# Patient Record
Sex: Female | Born: 1955 | Race: Black or African American | Hispanic: No | Marital: Single | Smoking: Former smoker
Health system: Southern US, Community
[De-identification: ages and names within clinical notes are randomized; demographics above are authoritative.]

## PROBLEM LIST (undated history)

## (undated) DIAGNOSIS — F419 Anxiety disorder, unspecified: Secondary | ICD-10-CM

## (undated) DIAGNOSIS — I10 Essential (primary) hypertension: Secondary | ICD-10-CM

## (undated) DIAGNOSIS — J42 Unspecified chronic bronchitis: Secondary | ICD-10-CM

## (undated) DIAGNOSIS — G43909 Migraine, unspecified, not intractable, without status migrainosus: Secondary | ICD-10-CM

## (undated) DIAGNOSIS — M1990 Unspecified osteoarthritis, unspecified site: Secondary | ICD-10-CM

## (undated) DIAGNOSIS — J189 Pneumonia, unspecified organism: Secondary | ICD-10-CM

## (undated) DIAGNOSIS — F1410 Cocaine abuse, uncomplicated: Secondary | ICD-10-CM

## (undated) DIAGNOSIS — J45909 Unspecified asthma, uncomplicated: Secondary | ICD-10-CM

## (undated) DIAGNOSIS — J449 Chronic obstructive pulmonary disease, unspecified: Secondary | ICD-10-CM

## (undated) HISTORY — PX: TUBAL LIGATION: SHX77

## (undated) HISTORY — PX: BUNIONECTOMY: SHX129

## (undated) HISTORY — PX: ABDOMINAL HYSTERECTOMY: SHX81

---

## 1999-06-16 HISTORY — PX: LACERATION REPAIR: SHX5168

## 1999-06-25 ENCOUNTER — Emergency Department (HOSPITAL_COMMUNITY): Admission: EM | Admit: 1999-06-25 | Discharge: 1999-06-25 | Payer: Self-pay | Admitting: *Deleted

## 1999-06-29 ENCOUNTER — Ambulatory Visit (HOSPITAL_BASED_OUTPATIENT_CLINIC_OR_DEPARTMENT_OTHER): Admission: RE | Admit: 1999-06-29 | Discharge: 1999-06-29 | Payer: Self-pay | Admitting: Orthopedic Surgery

## 1999-07-06 ENCOUNTER — Encounter: Admission: RE | Admit: 1999-07-06 | Discharge: 1999-10-04 | Payer: Self-pay | Admitting: Orthopedic Surgery

## 1999-12-23 ENCOUNTER — Emergency Department (HOSPITAL_COMMUNITY): Admission: EM | Admit: 1999-12-23 | Discharge: 1999-12-23 | Payer: Self-pay | Admitting: Emergency Medicine

## 1999-12-23 ENCOUNTER — Encounter: Payer: Self-pay | Admitting: Emergency Medicine

## 2001-10-30 ENCOUNTER — Encounter: Payer: Self-pay | Admitting: Emergency Medicine

## 2001-10-30 ENCOUNTER — Emergency Department (HOSPITAL_COMMUNITY): Admission: EM | Admit: 2001-10-30 | Discharge: 2001-10-30 | Payer: Self-pay | Admitting: Emergency Medicine

## 2002-07-22 ENCOUNTER — Encounter: Payer: Self-pay | Admitting: Emergency Medicine

## 2002-07-23 ENCOUNTER — Encounter: Payer: Self-pay | Admitting: General Surgery

## 2002-07-23 ENCOUNTER — Inpatient Hospital Stay (HOSPITAL_COMMUNITY): Admission: EM | Admit: 2002-07-23 | Discharge: 2002-07-25 | Payer: Self-pay | Admitting: *Deleted

## 2006-03-17 ENCOUNTER — Emergency Department (HOSPITAL_COMMUNITY): Admission: EM | Admit: 2006-03-17 | Discharge: 2006-03-17 | Payer: Self-pay | Admitting: Emergency Medicine

## 2006-07-14 ENCOUNTER — Emergency Department (HOSPITAL_COMMUNITY): Admission: EM | Admit: 2006-07-14 | Discharge: 2006-07-14 | Payer: Self-pay | Admitting: Emergency Medicine

## 2009-02-08 ENCOUNTER — Emergency Department (HOSPITAL_COMMUNITY): Admission: EM | Admit: 2009-02-08 | Discharge: 2009-02-08 | Payer: Self-pay | Admitting: Family Medicine

## 2009-07-08 ENCOUNTER — Inpatient Hospital Stay (HOSPITAL_COMMUNITY): Admission: EM | Admit: 2009-07-08 | Discharge: 2009-07-10 | Payer: Self-pay | Admitting: Emergency Medicine

## 2009-07-08 ENCOUNTER — Ambulatory Visit: Payer: Self-pay | Admitting: Family Medicine

## 2010-10-16 LAB — BASIC METABOLIC PANEL
Calcium: 9.1 mg/dL (ref 8.4–10.5)
Calcium: 9.3 mg/dL (ref 8.4–10.5)
Chloride: 104 mEq/L (ref 96–112)
GFR calc Af Amer: >60 mL/min (ref >60)
GFR calc Af Amer: >60 mL/min (ref >60)
GFR calc non Af Amer: 50 mL/min — ABNORMAL LOW (ref >60)
Potassium: 4.4 mEq/L (ref 3.5–5.1)
Sodium: 140 mEq/L (ref 135–145)

## 2010-10-16 LAB — POCT I-STAT, CHEM 8
BUN: 15 mg/dL (ref 6–23)
Chloride: 110 mEq/L (ref 96–112)
Creatinine, Ser: 1.1 mg/dL (ref 0.4–1.2)
Potassium: 4 mEq/L (ref 3.5–5.1)
Sodium: 138 mEq/L (ref 135–145)

## 2010-10-16 LAB — CARDIAC PANEL(CRET KIN+CKTOT+MB+TROPI)
CK, MB: 1.1 ng/mL (ref 0.3–4.0)
Relative Index: INVALID (ref 0.0–2.5)
Total CK: 74 U/L (ref 7–177)

## 2010-10-16 LAB — DRUGS OF ABUSE SCREEN W/O ALC, ROUTINE URINE
Barbiturate Quant, Ur: NEGATIVE
Benzodiazepines.: NEGATIVE
Cocaine Metabolites: POSITIVE — AB
Creatinine,U: 242.2 mg/dL
Phencyclidine (PCP): NEGATIVE

## 2010-10-16 LAB — POCT CARDIAC MARKERS
CKMB, poc: 1.4 ng/mL (ref 1.0–8.0)
Myoglobin, poc: 57.9 ng/mL (ref 12–200)
Troponin i, poc: <0.05 ng/mL (ref 0.00–0.09)

## 2010-10-16 LAB — COCAINE, URINE, CONFIRMATION: Benzoylecgonine GC/MS Conf: 550 ng/mL

## 2010-10-16 LAB — CK TOTAL AND CKMB (NOT AT ARMC)
CK, MB: 1.7 ng/mL (ref 0.3–4.0)
Relative Index: 1.7 (ref 0.0–2.5)
Total CK: 101 U/L (ref 7–177)

## 2010-10-16 LAB — CBC: MCV: 89.2 fL (ref 78.0–100.0)

## 2010-10-16 LAB — TROPONIN I: Troponin I: 0.01 ng/mL (ref 0.00–0.06)

## 2010-12-01 NOTE — Op Note (Signed)
[place]  Patient:    Vanessa Wallace                       MRN: [id number] Proc. Date: 06/29/99 Adm. Date:  [id number] Attending:  Ronne Binning CC:         Nicki Reaper, M.D., 2 copies to office                           Operative Report  PREOPERATIVE DIAGNOSIS:  Laceration, left thenar eminence.  POSTOPERATIVE DIAGNOSIS:  Laceration, left thenar eminence.  OPERATION:  Repair FPL, radial digital nerve, intrinsics, left thumb.  SURGEON:  Nicki Reaper, M.D.  ASSISTANT:  Joaquin Courts, R.N.  ANESTHESIA:  General.  ANESTHESIOLOGIST:  Cliffton Asters. Ivin Booty, M.D.  HISTORY:  The patient is a 55 year old female with a history of a laceration to her thenar eminence, left hand.  She is complaining of numbness and tingling and the inability to flex the IP joint.  PROCEDURE:  The patient was brought to the operating room where a general endotracheal intubation and anesthesia were carried out without difficulty. She was prepped and draped using Betadine scrub and solution with the left arm free.  The limb was exsanguinated with an Esmarch bandage, tourniquet placed high on the arm, was inflated to 250 mmHg.  The wound was opened, extended distally.  The laceration was immediately apparent.  A hematoma was removed.  The dissection identified the FPL tendon, which was entirely lacerated.  A separate incision was made in the forearm and the tendon delivered distally.  The repair was then performed with a 3-0 Ti-Cron suture, with a 3-0 Ti-Cron in the epitenon.  The modified ______ was used for the initial repair.  The digital nerves were identified.  The ulnar digital nerve was intact.  The radial digital nerve was lacerated.  The operative microscope was brought into position.  The ends of the muscles of the intrinsics were closed with figure-of-eight 4-0 Vicryl sutures after copiously irrigating the wound.  The digital nerve was prepared.   Normal fascicles identified and a repair performed with interrupted 9-0 nylon sutures lining each fascicle.  The wound was again irrigated, skin closed with interrupted 5-0 nylon sutures.  Sterile compressive dressing and splint was applied.  The patient tolerated the procedure well and was taken to the recovery room for observation in satisfactory condition.  She is discharged to home to return to the [facility] in [place] in a week to 10 days on Vicodin and Keflex. DD:  06/29/99 TD:  06/29/99 Job: [id number]
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
## 2012-03-11 ENCOUNTER — Encounter (HOSPITAL_COMMUNITY): Payer: Self-pay | Admitting: Emergency Medicine

## 2012-03-11 ENCOUNTER — Emergency Department (HOSPITAL_COMMUNITY)
Admission: EM | Admit: 2012-03-11 | Discharge: 2012-03-11 | Disposition: A | Discharge status: Home / Self Care | Payer: Medicaid Other | Source: Home / Self Care | Attending: Emergency Medicine | Admitting: Emergency Medicine

## 2012-03-11 DIAGNOSIS — M5430 Sciatica, unspecified side: Secondary | ICD-10-CM

## 2012-03-11 DIAGNOSIS — L84 Corns and callosities: Secondary | ICD-10-CM

## 2012-03-11 DIAGNOSIS — I10 Essential (primary) hypertension: Secondary | ICD-10-CM

## 2012-03-11 LAB — POCT I-STAT, CHEM 8
Creatinine, Ser: 1.2 mg/dL — ABNORMAL HIGH (ref 0.50–1.10)
Sodium: 143 mEq/L (ref 135–145)

## 2012-03-11 MED ORDER — LISINOPRIL-HYDROCHLOROTHIAZIDE 20-25 MG PO TABS: 1.0000 | ORAL_TABLET | Freq: Every day | ORAL | Status: DC

## 2012-03-11 MED ORDER — PREDNISONE 5 MG PO KIT: 1.0000 | PACK | Freq: Every day | ORAL | Status: DC

## 2012-03-11 MED ORDER — CYCLOBENZAPRINE HCL 5 MG PO TABS: 5.0000 mg | ORAL_TABLET | Freq: Three times a day (TID) | ORAL | Status: AC | PRN

## 2012-03-11 NOTE — ED Notes (Signed)
States she fell and injured her low back a few days ago; also has are on bottom of foot that has been bothering her

## 2012-03-11 NOTE — ED Provider Notes (Signed)
Chief Complaint  Patient presents with  . Back Pain    History of Present Illness:   The patient is a 56 year old former cocaine user who presents today with a 2 month history of lower back pain. This began after a fall. She was seen at [facility] and an x-ray was done. She was prescribed hydrocodone. Her back is still hurting. She localizes the pain in the midline at the L5-S1 level with radiation down the entire right leg as far as the foot with some weakness in the leg. She denies any numbness or tingling, bladder or bowel complaints. She's had no abdominal pain, fever, chills, or unintentional weight loss. She also has a two-year history of foot pain involving mostly the right foot. She had surgery on this 2 years ago. She has a painful corn. It hurts her to walk. She also has had high blood pressure for about 2 years. She was on medication for this but ran out a month and half ago. Her blood pressure is elevated today and she had some headaches, generalized weakness, and sees some flashing lights. She has a history of bronchitis and uses an albuterol inhaler.  Review of Systems:  Other than noted above, the patient denies any of the following symptoms: Systemic:  No fever, chills, fatigue, or weight loss. GI:  No abdominal pain, nausea, vomiting, diarrhea, constipation or blood in stool. GU:  No dysuria, frequency, urgency, or hematuria. No incontinence or difficulty urinating.  M-S:  No neck pain, joint pain, arthritis, or myalgias. Neuro:  No parethesias or muscular weakness. Skin:  No rash or itching.   PMFSH:  Past medical history, family history, social history, meds, and allergies were reviewed.  Physical Exam:   Vital signs:  BP 179/115  Pulse 71  Temp 98.1 F (36.7 C) (Oral)  Resp 16  SpO2 100% General:  Alert, oriented, in no distress. Abdomen:  Soft, non-tender.  No organomegaly or mass.  No pulsatile midline abdominal mass or bruit. Back:  There is pain to  palpation lower back at the L5-S1 level. Her back has a limited range of motion with 30 of flexion, 10 of extension, 20 lateral bending, and 30 of rotation with pain. Straight leg raising is positive on the right with a positive Lasegue's sign and positive popliteal compression and negative on the left. Neuro:  Normal muscle strength, sensations and DTRs. Extremities: Pedal pulses were full, there was no edema. Her feet are filthy and caked with dry skin and dirt. She has a large corn on the plantar surface of the right foot near the metatarsal heads which is tender to touch. Skin:  Clear, warm and dry.  No rash.      Labs:   Results for orders placed during the hospital encounter of 03/11/12  POCT I-STAT, CHEM 8      Component Value Range   Sodium 143  135 - 145 mEq/L   Potassium 4.2  3.5 - 5.1 mEq/L   Chloride 106  96 - 112 mEq/L   BUN 17  6 - 23 mg/dL   Creatinine, Ser 4.09 (*) 0.50 - 1.10 mg/dL   Glucose, Bld 79  70 - 99 mg/dL   Calcium, Ion 8.11 (*) 1.12 - 1.23 mmol/L   TCO2 25  0 - 100 mmol/L   Hemoglobin 15.3 (*) 12.0 - 15.0 g/dL   HCT 91.4  78.2 - 95.6 %    Procedure Note:  Verbal informed consent was obtained from the patient.  Risks and benefits were outlined with the patient.  Patient understands and accepts these risks.  Identity of the patient was confirmed verbally and by armband.    Procedure was performed as followed:  The bottom of the foot was cleansed as best as possible with alcohol and the corn was pared off with a #15 scalpel blade.  Patient tolerated the procedure well without any immediate complications.  Assessment:  The primary encounter diagnosis was Corn. Diagnoses of Hypertension and Sciatica were also pertinent to this visit.  Plan:   1.  The following meds were prescribed:   New Prescriptions   CYCLOBENZAPRINE (FLEXERIL) 5 MG TABLET    Take 1 tablet (5 mg total) by mouth 3 (three) times daily as needed for muscle spasms.    LISINOPRIL-HYDROCHLOROTHIAZIDE (PRINZIDE,ZESTORETIC) 20-25 MG PER TABLET    Take 1 tablet by mouth daily.   PREDNISONE 5 MG KIT    Take 1 kit (5 mg total) by mouth daily after breakfast. Prednisone 5 mg 6 day dosepack.  Take as directed.   2.  The patient was instructed in symptomatic care and handouts were given. 3.  The patient was told to return if becoming worse in any way, if no better in 2 weeks, and given some red flag symptoms that would indicate earlier return. 4.  The patient was encouraged to try to be as active as possible and given some exercises to do followed by moist heat.    Reuben Likes, MD 03/11/12 2300

## 2013-05-28 ENCOUNTER — Emergency Department (HOSPITAL_COMMUNITY)
Admission: EM | Admit: 2013-05-28 | Discharge: 2013-05-28 | Disposition: A | Discharge status: Home / Self Care | Payer: Medicaid Other | Attending: Emergency Medicine | Admitting: Emergency Medicine

## 2013-05-28 ENCOUNTER — Emergency Department (HOSPITAL_COMMUNITY): Payer: Medicaid Other

## 2013-05-28 ENCOUNTER — Encounter (HOSPITAL_COMMUNITY): Payer: Self-pay | Admitting: Emergency Medicine

## 2013-05-28 DIAGNOSIS — I10 Essential (primary) hypertension: Secondary | ICD-10-CM | POA: Insufficient documentation

## 2013-05-28 DIAGNOSIS — IMO0002 Reserved for concepts with insufficient information to code with codable children: Secondary | ICD-10-CM | POA: Insufficient documentation

## 2013-05-28 DIAGNOSIS — J45901 Unspecified asthma with (acute) exacerbation: Secondary | ICD-10-CM | POA: Insufficient documentation

## 2013-05-28 DIAGNOSIS — Z79899 Other long term (current) drug therapy: Secondary | ICD-10-CM | POA: Insufficient documentation

## 2013-05-28 DIAGNOSIS — F17200 Nicotine dependence, unspecified, uncomplicated: Secondary | ICD-10-CM | POA: Insufficient documentation

## 2013-05-28 HISTORY — DX: Essential (primary) hypertension: I10

## 2013-05-28 HISTORY — DX: Unspecified asthma, uncomplicated: J45.909

## 2013-05-28 LAB — CBC WITH DIFFERENTIAL/PLATELET
Basophils Absolute: 0 10*3/uL (ref 0.0–0.1)
Basophils Relative: 0 % (ref 0–1)
Eosinophils Relative: 0 % (ref 0–5)
HCT: 38.7 % (ref 36.0–46.0)
Lymphocytes Relative: 8 % — ABNORMAL LOW (ref 12–46)
Lymphs Abs: 0.9 10*3/uL (ref 0.7–4.0)
MCH: 30.1 pg (ref 26.0–34.0)
MCV: 89.6 fL (ref 78.0–100.0)
Monocytes Absolute: 0.3 10*3/uL (ref 0.1–1.0)
RBC: 4.32 MIL/uL (ref 3.87–5.11)
RDW: 15.2 % (ref 11.5–15.5)
WBC: 11.4 10*3/uL — ABNORMAL HIGH (ref 4.0–10.5)

## 2013-05-28 LAB — BASIC METABOLIC PANEL
CO2: 25 mEq/L (ref 19–32)
Calcium: 9.2 mg/dL (ref 8.4–10.5)
Creatinine, Ser: 0.95 mg/dL (ref 0.50–1.10)
Glucose, Bld: 132 mg/dL — ABNORMAL HIGH (ref 70–99)

## 2013-05-28 LAB — PRO B NATRIURETIC PEPTIDE: Pro B Natriuretic peptide (BNP): 69.4 pg/mL (ref 0–125)

## 2013-05-28 MED ORDER — ALBUTEROL SULFATE (5 MG/ML) 0.5% IN NEBU
10.0000 mg | INHALATION_SOLUTION | Freq: Once | RESPIRATORY_TRACT | Status: AC
  Administered 2013-05-28: 10 mg via RESPIRATORY_TRACT
  Filled 2013-05-28: qty 2

## 2013-05-28 MED ORDER — ALBUTEROL SULFATE (5 MG/ML) 0.5% IN NEBU: 2.5000 mg | INHALATION_SOLUTION | RESPIRATORY_TRACT | Status: DC

## 2013-05-28 MED ORDER — MAGNESIUM SULFATE 40 MG/ML IJ SOLN
2.0000 g | Freq: Once | INTRAMUSCULAR | Status: AC
  Administered 2013-05-28: 2 g via INTRAVENOUS
  Filled 2013-05-28: qty 50

## 2013-05-28 MED ORDER — IPRATROPIUM BROMIDE 0.02 % IN SOLN
0.5000 mg | Freq: Once | RESPIRATORY_TRACT | Status: AC
  Administered 2013-05-28: 0.5 mg via RESPIRATORY_TRACT
  Filled 2013-05-28: qty 2.5

## 2013-05-28 MED ORDER — PREDNISONE 20 MG PO TABS: 40.0000 mg | ORAL_TABLET | Freq: Every day | ORAL | Status: DC

## 2013-05-28 MED ORDER — ALBUTEROL SULFATE HFA 108 (90 BASE) MCG/ACT IN AERS
2.0000 | INHALATION_SPRAY | Freq: Once | RESPIRATORY_TRACT | Status: AC
  Administered 2013-05-28: 2 via RESPIRATORY_TRACT
  Filled 2013-05-28: qty 6.7

## 2013-05-28 MED ORDER — IPRATROPIUM BROMIDE 0.02 % IN SOLN: 0.5000 mg | RESPIRATORY_TRACT | Status: DC

## 2013-05-28 NOTE — ED Notes (Signed)
Pt arrives via EMS from home with c/o SOB that began yesterday. Dry non productive cough. Pt tachypneic and wheezes throughout noted by EMs. Received 5mg  albuterol/ 0.5mg  atrovent, 125mg  solumedrol PTA. 20g IV placed L AC. CBG 101.

## 2013-05-28 NOTE — ED Notes (Signed)
MD at bedside. 

## 2013-05-28 NOTE — Progress Notes (Signed)
Pt refuses abg. RN notified

## 2013-05-28 NOTE — ED Provider Notes (Signed)
CSN: [id number]     Arrival date & time 05/28/13  4098 History   First MD Initiated Contact with Patient 05/28/13 0747     Chief Complaint  Patient presents with  . Shortness of Breath   (Consider location/radiation/quality/duration/timing/severity/associated sxs/prior Treatment) Patient is a 57 y.o. female presenting with shortness of breath. The history is provided by the patient. No language interpreter was used.  Shortness of Breath Severity:  Moderate Onset quality:  Gradual Duration:  8 hours Timing:  Constant Progression:  Worsening Chronicity:  Recurrent Relieved by: neb treatment. Worsened by:  Movement Ineffective treatments:  Inhaler Associated symptoms: cough, sputum production and wheezing   Associated symptoms: no abdominal pain, no chest pain, no claudication, no diaphoresis, no fever, no headaches, no neck pain, no sore throat and no vomiting   Cough:    Cough characteristics:  Productive   Sputum characteristics:  Clear   Severity:  Moderate   Onset quality:  Gradual   Duration:  8 hours   Timing:  Constant   Progression:  Worsening   Chronicity:  New Wheezing:    Severity:  Moderate   Onset quality:  Gradual   Duration:  8 hours   Timing:  Constant   Progression:  Unchanged   Chronicity:  New Risk factors: tobacco use   Risk factors: no hx of PE/DVT, no obesity, no oral contraceptive use, no prolonged immobilization and no recent surgery     Past Medical History  Diagnosis Date  . Asthma   . Hypertension    History reviewed. No pertinent past surgical history. History reviewed. No pertinent family history. History  Substance Use Topics  . Smoking status: Current Every Day Smoker  . Smokeless tobacco: Not on file  . Alcohol Use: Yes   OB History   Grav Para Term Preterm Abortions TAB SAB Ect Mult Living                 Review of Systems  Constitutional: Negative for fever, chills, diaphoresis, activity change, appetite change and fatigue.   HENT: Negative for congestion, facial swelling, rhinorrhea and sore throat.   Eyes: Negative for photophobia and discharge.  Respiratory: Positive for cough, sputum production, shortness of breath and wheezing. Negative for chest tightness.   Cardiovascular: Negative for chest pain, palpitations, claudication and leg swelling.  Gastrointestinal: Negative for nausea, vomiting, abdominal pain and diarrhea.  Endocrine: Negative for polydipsia and polyuria.  Genitourinary: Negative for dysuria, frequency, difficulty urinating and pelvic pain.  Musculoskeletal: Negative for arthralgias, back pain, neck pain and neck stiffness.  Skin: Negative for color change and wound.  Allergic/Immunologic: Negative for immunocompromised state.  Neurological: Negative for facial asymmetry, weakness, numbness and headaches.  Hematological: Does not bruise/bleed easily.  Psychiatric/Behavioral: Negative for confusion and agitation.    Allergies  Review of patient's allergies indicates no known allergies.  Home Medications   Current Outpatient Rx  Name  Route  Sig  Dispense  Refill  . albuterol (PROVENTIL HFA;VENTOLIN HFA) 108 (90 BASE) MCG/ACT inhaler   Inhalation   Inhale into the lungs every 6 (six) hours as needed for wheezing or shortness of breath (wheezing).         . budesonide-formoterol (SYMBICORT) 160-4.5 MCG/ACT inhaler   Inhalation   Inhale 2 puffs into the lungs 2 (two) times daily.         Marland Kitchen EXPIRED: lisinopril-hydrochlorothiazide (PRINZIDE,ZESTORETIC) 20-25 MG per tablet   Oral   Take 1 tablet by mouth daily.   30 tablet  0   . predniSONE (DELTASONE) 20 MG tablet   Oral   Take 2 tablets (40 mg total) by mouth daily.   25 tablet   0    BP 129/76  Pulse 100  Temp(Src) 99 F (37.2 C) (Oral)  Resp 29  SpO2 93% Physical Exam  Constitutional: She is oriented to person, place, and time. She appears well-developed and well-nourished. No distress.  HENT:  Head:  Normocephalic and atraumatic.  Mouth/Throat: No oropharyngeal exudate.  Eyes: Pupils are equal, round, and reactive to light.  Neck: Normal range of motion. Neck supple.  Cardiovascular: Normal rate, regular rhythm and normal heart sounds.  Exam reveals no gallop and no friction rub.   No murmur heard. Pulmonary/Chest: Tachypnea noted. No respiratory distress. She has wheezes in the right upper field, the right middle field, the left upper field and the left middle field. She has no rales.  Abdominal: Soft. Bowel sounds are normal. She exhibits no distension and no mass. There is no tenderness. There is no rebound and no guarding.  Musculoskeletal: Normal range of motion. She exhibits no edema and no tenderness.  Neurological: She is alert and oriented to person, place, and time.  Skin: Skin is warm and dry.  Psychiatric: She has a normal mood and affect.    ED Course  Procedures (including critical care time) Labs Review Labs Reviewed  CBC WITH DIFFERENTIAL - Abnormal; Notable for the following:    WBC 11.4 (*)    Neutrophils Relative % 90 (*)    Neutro Abs 10.2 (*)    Lymphocytes Relative 8 (*)    Monocytes Relative 2 (*)    All other components within normal limits  BASIC METABOLIC PANEL - Abnormal; Notable for the following:    Glucose, Bld 132 (*)    GFR calc non Af Amer 66 (*)    GFR calc Af Amer 77 (*)    All other components within normal limits  PRO B NATRIURETIC PEPTIDE   Imaging Review Dg Chest Portable 1 View  05/28/2013   CLINICAL DATA:  Shortness of breath, hypertension  EXAM: PORTABLE CHEST - 1 VIEW  COMPARISON:  07/08/2009  FINDINGS: Heart is upper limits normal in size. Lungs are clear. No effusions. No acute bony abnormality.  IMPRESSION: No active disease.   Electronically Signed   By: Charlett Nose M.D.   On: 05/28/2013 08:22    EKG Interpretation     Ventricular Rate:  87 PR Interval:  118 QRS Duration: 83 QT Interval:  392 QTC Calculation: 472 R  Axis:   74 Text Interpretation:  Sinus rhythm Borderline short PR interval LVH with secondary repolarization abnormality            MDM   1. Acute asthma exacerbation    Pt is a 57 y.o. female with Pmhx as above including asthma who presents with about 8 hrs of gradual onset productive cough, SOB/wheezing.  Pt states symptoms were not improved by inhaler at home, but did have some improvement w/ duoneb by EMS.  125 solumedrol also given. On PE, Pt tachypnea, but can speak in 1 full sentence, HR 87, 97% on RA.  Wheezing heard in upper lobes.  Given hx, symptoms were most concerning for acute asthma exacerbation. No risk factors for PE other than smoking. Continuous albuterol/ipratropium, but pt reported feelng worse, had continued tachypnea and unchanged lung exam.  2mg  IV magnesium given and pt felt much improved after this and full albuterol/piratropium treatment.  CXR unremarkable.  EKG as above.  I approached pt about admission for asthma exacerbation, but be refused admission.  She understood my concern that she could acutely decompensation at home w/o continued treatment in a monitored setting and would likely have to return to the ED for continued treatment.  I believe she has capacity to much such decision.  Prior to d/c, pt given albuterol inhaler w/ spacer, and rx for 5d prednisone burst.  (05/29/13 Of note, quantity of prednisone rx incorrect, was verbally changed by pharmacist at [facility] [phone] to 10 tabs.  Ms Hines had not yet picked up Rx)        Shanna Cisco, MD 05/29/13 [phone]

## 2013-09-17 ENCOUNTER — Inpatient Hospital Stay (HOSPITAL_COMMUNITY)
Admission: EM | Admit: 2013-09-17 | Discharge: 2013-09-19 | DRG: 195 | Disposition: A | Discharge status: Home / Self Care | Payer: Medicaid Other | Attending: Internal Medicine | Admitting: Internal Medicine

## 2013-09-17 ENCOUNTER — Emergency Department (HOSPITAL_COMMUNITY): Payer: Medicaid Other

## 2013-09-17 ENCOUNTER — Encounter (HOSPITAL_COMMUNITY): Payer: Self-pay | Admitting: Emergency Medicine

## 2013-09-17 DIAGNOSIS — F1410 Cocaine abuse, uncomplicated: Secondary | ICD-10-CM

## 2013-09-17 DIAGNOSIS — Z79899 Other long term (current) drug therapy: Secondary | ICD-10-CM

## 2013-09-17 DIAGNOSIS — F1490 Cocaine use, unspecified, uncomplicated: Secondary | ICD-10-CM | POA: Diagnosis present

## 2013-09-17 DIAGNOSIS — J45909 Unspecified asthma, uncomplicated: Secondary | ICD-10-CM

## 2013-09-17 DIAGNOSIS — F17200 Nicotine dependence, unspecified, uncomplicated: Secondary | ICD-10-CM | POA: Diagnosis present

## 2013-09-17 DIAGNOSIS — D72829 Elevated white blood cell count, unspecified: Secondary | ICD-10-CM | POA: Diagnosis present

## 2013-09-17 DIAGNOSIS — I10 Essential (primary) hypertension: Secondary | ICD-10-CM | POA: Diagnosis present

## 2013-09-17 DIAGNOSIS — J189 Pneumonia, unspecified organism: Principal | ICD-10-CM

## 2013-09-17 DIAGNOSIS — R079 Chest pain, unspecified: Secondary | ICD-10-CM

## 2013-09-17 DIAGNOSIS — E876 Hypokalemia: Secondary | ICD-10-CM | POA: Diagnosis present

## 2013-09-17 HISTORY — DX: Migraine, unspecified, not intractable, without status migrainosus: G43.909

## 2013-09-17 HISTORY — DX: Unspecified chronic bronchitis: J42

## 2013-09-17 HISTORY — DX: Pneumonia, unspecified organism: J18.9

## 2013-09-17 HISTORY — DX: Unspecified osteoarthritis, unspecified site: M19.90

## 2013-09-17 LAB — CBC WITH DIFFERENTIAL/PLATELET
Basophils Absolute: 0 10*3/uL (ref 0.0–0.1)
Basophils Relative: 0 % (ref 0–1)
Eosinophils Absolute: 0 10*3/uL (ref 0.0–0.7)
Eosinophils Relative: 0 % (ref 0–5)
HCT: 35.8 % — ABNORMAL LOW (ref 36.0–46.0)
HEMOGLOBIN: 12.1 g/dL (ref 12.0–15.0)
LYMPHS ABS: 1 10*3/uL (ref 0.7–4.0)
LYMPHS PCT: 6 % — AB (ref 12–46)
MCH: 29.1 pg (ref 26.0–34.0)
MCHC: 33.8 g/dL (ref 30.0–36.0)
MCV: 86.1 fL (ref 78.0–100.0)
Monocytes Absolute: 1.1 10*3/uL — ABNORMAL HIGH (ref 0.1–1.0)
Monocytes Relative: 7 % (ref 3–12)
NEUTROS PCT: 87 % — AB (ref 43–77)
Neutro Abs: 13.6 10*3/uL — ABNORMAL HIGH (ref 1.7–7.7)
Platelets: 269 10*3/uL (ref 150–400)
RBC: 4.16 MIL/uL (ref 3.87–5.11)
RDW: 14.4 % (ref 11.5–15.5)
WBC: 15.7 10*3/uL — AB (ref 4.0–10.5)

## 2013-09-17 LAB — BASIC METABOLIC PANEL
BUN: 14 mg/dL (ref 6–23)
CO2: 24 mEq/L (ref 19–32)
Calcium: 9.1 mg/dL (ref 8.4–10.5)
Chloride: 98 mEq/L (ref 96–112)
Creatinine, Ser: 0.73 mg/dL (ref 0.50–1.10)
GFR calc Af Amer: >90 mL/min (ref >90)
GLUCOSE: 123 mg/dL — AB (ref 70–99)
POTASSIUM: 3.4 meq/L — AB (ref 3.7–5.3)
Sodium: 136 mEq/L — ABNORMAL LOW (ref 137–147)

## 2013-09-17 LAB — RAPID URINE DRUG SCREEN, HOSP PERFORMED
AMPHETAMINES: NOT DETECTED
Barbiturates: NOT DETECTED
Benzodiazepines: NOT DETECTED
Cocaine: POSITIVE — AB
OPIATES: NOT DETECTED
Tetrahydrocannabinol: NOT DETECTED

## 2013-09-17 LAB — EXPECTORATED SPUTUM ASSESSMENT W REFEX TO RESP CULTURE

## 2013-09-17 LAB — STREP PNEUMONIAE URINARY ANTIGEN: Strep Pneumo Urinary Antigen: NEGATIVE

## 2013-09-17 LAB — INFLUENZA PANEL BY PCR (TYPE A & B)
H1N1 flu by pcr: NOT DETECTED
INFLAPCR: NEGATIVE
Influenza B By PCR: NEGATIVE

## 2013-09-17 LAB — HIV ANTIBODY (ROUTINE TESTING W REFLEX): HIV: NONREACTIVE

## 2013-09-17 LAB — TROPONIN I: Troponin I: <0.3 ng/mL (ref <0.30)

## 2013-09-17 LAB — EXPECTORATED SPUTUM ASSESSMENT W GRAM STAIN, RFLX TO RESP C

## 2013-09-17 MED ORDER — HYDROCHLOROTHIAZIDE 25 MG PO TABS
25.0000 mg | ORAL_TABLET | Freq: Every day | ORAL | Status: DC
  Administered 2013-09-17 – 2013-09-19 (×3): 25 mg via ORAL
  Filled 2013-09-17 (×3): qty 1

## 2013-09-17 MED ORDER — SODIUM CHLORIDE 0.9 % IJ SOLN
3.0000 mL | Freq: Two times a day (BID) | INTRAMUSCULAR | Status: DC
  Administered 2013-09-17 – 2013-09-19 (×3): 3 mL via INTRAVENOUS

## 2013-09-17 MED ORDER — IPRATROPIUM BROMIDE 0.02 % IN SOLN: 0.5000 mg | Freq: Four times a day (QID) | RESPIRATORY_TRACT | Status: DC

## 2013-09-17 MED ORDER — ACETAMINOPHEN 325 MG PO TABS
650.0000 mg | ORAL_TABLET | Freq: Once | ORAL | Status: AC
  Administered 2013-09-17: 650 mg via ORAL
  Filled 2013-09-17: qty 2

## 2013-09-17 MED ORDER — LISINOPRIL 20 MG PO TABS
20.0000 mg | ORAL_TABLET | Freq: Every day | ORAL | Status: DC
  Administered 2013-09-17 – 2013-09-19 (×3): 20 mg via ORAL
  Filled 2013-09-17 (×3): qty 1

## 2013-09-17 MED ORDER — ALBUTEROL (5 MG/ML) CONTINUOUS INHALATION SOLN
15.0000 mg/h | INHALATION_SOLUTION | Freq: Once | RESPIRATORY_TRACT | Status: AC
  Administered 2013-09-17: 15 mg/h via RESPIRATORY_TRACT
  Filled 2013-09-17: qty 20

## 2013-09-17 MED ORDER — HEPARIN SODIUM (PORCINE) 5000 UNIT/ML IJ SOLN
5000.0000 [IU] | Freq: Three times a day (TID) | INTRAMUSCULAR | Status: DC
  Administered 2013-09-17 – 2013-09-19 (×6): 5000 [IU] via SUBCUTANEOUS
  Filled 2013-09-17 (×9): qty 1

## 2013-09-17 MED ORDER — SODIUM CHLORIDE 0.9 % IV BOLUS (SEPSIS)
1000.0000 mL | Freq: Once | INTRAVENOUS | Status: AC
  Administered 2013-09-17: 1000 mL via INTRAVENOUS

## 2013-09-17 MED ORDER — LISINOPRIL-HYDROCHLOROTHIAZIDE 20-25 MG PO TABS: 1.0000 | ORAL_TABLET | Freq: Every day | ORAL | Status: DC

## 2013-09-17 MED ORDER — LEVOFLOXACIN 500 MG PO TABS
500.0000 mg | ORAL_TABLET | Freq: Once | ORAL | Status: AC
  Administered 2013-09-17: 500 mg via ORAL
  Filled 2013-09-17: qty 1

## 2013-09-17 MED ORDER — AZITHROMYCIN 500 MG PO TABS
500.0000 mg | ORAL_TABLET | ORAL | Status: DC
  Administered 2013-09-17 – 2013-09-18 (×2): 500 mg via ORAL
  Filled 2013-09-17: qty 2
  Filled 2013-09-17 (×2): qty 1

## 2013-09-17 MED ORDER — BENZONATATE 100 MG PO CAPS
200.0000 mg | ORAL_CAPSULE | Freq: Once | ORAL | Status: AC
  Administered 2013-09-17: 200 mg via ORAL
  Filled 2013-09-17: qty 2

## 2013-09-17 MED ORDER — SODIUM CHLORIDE 0.9 % IJ SOLN: 3.0000 mL | Freq: Two times a day (BID) | INTRAMUSCULAR | Status: DC

## 2013-09-17 MED ORDER — PREDNISONE 20 MG PO TABS
60.0000 mg | ORAL_TABLET | Freq: Once | ORAL | Status: AC
  Administered 2013-09-17: 60 mg via ORAL
  Filled 2013-09-17: qty 3

## 2013-09-17 MED ORDER — IPRATROPIUM BROMIDE 0.02 % IN SOLN
0.5000 mg | Freq: Once | RESPIRATORY_TRACT | Status: AC
  Administered 2013-09-17: 0.5 mg via RESPIRATORY_TRACT
  Filled 2013-09-17: qty 2.5

## 2013-09-17 MED ORDER — LORAZEPAM 2 MG/ML IJ SOLN
1.0000 mg | Freq: Once | INTRAMUSCULAR | Status: AC
  Administered 2013-09-17: 1 mg via INTRAVENOUS
  Filled 2013-09-17: qty 1

## 2013-09-17 MED ORDER — GUAIFENESIN ER 600 MG PO TB12
600.0000 mg | ORAL_TABLET | Freq: Two times a day (BID) | ORAL | Status: DC
  Administered 2013-09-17 – 2013-09-19 (×3): 600 mg via ORAL
  Filled 2013-09-17 (×6): qty 1

## 2013-09-17 MED ORDER — CEFTRIAXONE SODIUM 1 G IJ SOLR
1.0000 g | INTRAMUSCULAR | Status: DC
  Administered 2013-09-17 – 2013-09-18 (×2): 1 g via INTRAVENOUS
  Filled 2013-09-17 (×3): qty 10

## 2013-09-17 MED ORDER — SODIUM CHLORIDE 0.9 % IV SOLN: 250.0000 mL | INTRAVENOUS | Status: DC | PRN

## 2013-09-17 MED ORDER — SODIUM CHLORIDE 0.9 % IJ SOLN: 3.0000 mL | INTRAMUSCULAR | Status: DC | PRN

## 2013-09-17 MED ORDER — ALBUTEROL SULFATE (2.5 MG/3ML) 0.083% IN NEBU: 2.5000 mg | INHALATION_SOLUTION | Freq: Four times a day (QID) | RESPIRATORY_TRACT | Status: DC

## 2013-09-17 MED ORDER — BUDESONIDE-FORMOTEROL FUMARATE 160-4.5 MCG/ACT IN AERO
2.0000 | INHALATION_SPRAY | Freq: Two times a day (BID) | RESPIRATORY_TRACT | Status: DC
  Administered 2013-09-18 – 2013-09-19 (×3): 2 via RESPIRATORY_TRACT
  Filled 2013-09-17: qty 6

## 2013-09-17 NOTE — ED Provider Notes (Signed)
CSN: [id number]     Arrival date & time 09/17/13  0917 History   First MD Initiated Contact with Patient 09/17/13 [phone]     Chief Complaint  Patient presents with  . Respiratory Distress     (Consider location/radiation/quality/duration/timing/severity/associated sxs/prior Treatment) HPI  This a 58 year old female with history of asthma and hypertension who presents with shortness of breath and cough. Patient reports several days of cough. Report of the last 24 hours she has had production of white phlegm. At 5 AM this morning she noted blood streaking in the phlegm. It occurs only occasionally and is described as scant. Patient denies any history COPD but is a current smoker. She does have a history of asthma. Patient reports that she's run out of her albuterol. She denies any fevers. She denies any chills.  Patient does report left rib pain which she describes as sharp. It is worse with breathing. Currently she is pain-free.  Past Medical History  Diagnosis Date  . Asthma   . Hypertension    History reviewed. No pertinent past surgical history. History reviewed. No pertinent family history. History  Substance Use Topics  . Smoking status: Current Every Day Smoker -- 0.30 packs/day    Types: Cigarettes  . Smokeless tobacco: Not on file  . Alcohol Use: Yes     Comment: Last drink 2 days ago   OB History   Grav Para Term Preterm Abortions TAB SAB Ect Mult Living                 Review of Systems  Constitutional: Negative for fever.  Respiratory: Positive for cough, shortness of breath and wheezing. Negative for chest tightness.   Cardiovascular: Negative for chest pain and leg swelling.  Gastrointestinal: Negative for nausea, vomiting, abdominal pain and blood in stool.  Genitourinary: Negative for dysuria.  Musculoskeletal: Negative for back pain.  Skin: Negative for wound.  Neurological: Negative for headaches.  Psychiatric/Behavioral: Negative for confusion.  All other  systems reviewed and are negative.      Allergies  Review of patient's allergies indicates no known allergies.  Home Medications   Current Outpatient Rx  Name  Route  Sig  Dispense  Refill  . albuterol (PROVENTIL HFA;VENTOLIN HFA) 108 (90 BASE) MCG/ACT inhaler   Inhalation   Inhale into the lungs every 6 (six) hours as needed for wheezing or shortness of breath (wheezing).         . budesonide-formoterol (SYMBICORT) 160-4.5 MCG/ACT inhaler   Inhalation   Inhale 2 puffs into the lungs 2 (two) times daily.         Marland Kitchen. lisinopril-hydrochlorothiazide (PRINZIDE,ZESTORETIC) 20-25 MG per tablet   Oral   Take 1 tablet by mouth daily.         . Pseudoeph-Doxylamine-DM-APAP (DAYQUIL/NYQUIL COLD/FLU RELIEF PO)   Oral   Take 1 capsule by mouth 2 (two) times daily as needed (for cold symptoms).          BP 151/81  Pulse 102  Temp(Src) 101.2 F (38.4 C) (Oral)  Resp 20  Wt 156 lb (70.761 kg)  SpO2 97% Physical Exam  Nursing note and vitals reviewed. Constitutional: She is oriented to person, place, and time. No distress.  Ill-appearing but nontoxic  HENT:  Head: Normocephalic and atraumatic.  Mouth/Throat: Oropharynx is clear and moist.  Eyes: Pupils are equal, round, and reactive to light.  Neck: Neck supple.  Cardiovascular: Regular rhythm and normal heart sounds.   Tachycardia  Pulmonary/Chest: Effort normal. No respiratory  distress. She has wheezes. She has no rales.  Decreased air movement, mild tachypnea, wheezes noted most prominently over the left lung.  Abdominal: Soft. Bowel sounds are normal. There is no tenderness.  Musculoskeletal: She exhibits no edema.  Neurological: She is alert and oriented to person, place, and time.  Skin: Skin is warm and dry.  Psychiatric: She has a normal mood and affect.    ED Course  Procedures (including critical care time) Labs Review Labs Reviewed  CBC WITH DIFFERENTIAL - Abnormal; Notable for the following:    WBC 15.7  (*)    HCT 35.8 (*)    Neutrophils Relative % 87 (*)    Neutro Abs 13.6 (*)    Lymphocytes Relative 6 (*)    Monocytes Absolute 1.1 (*)    All other components within normal limits  BASIC METABOLIC PANEL - Abnormal; Notable for the following:    Sodium 136 (*)    Potassium 3.4 (*)    Glucose, Bld 123 (*)    All other components within normal limits  CULTURE, BLOOD (ROUTINE X 2)  CULTURE, BLOOD (ROUTINE X 2)  CULTURE, EXPECTORATED SPUTUM-ASSESSMENT  GRAM STAIN  TROPONIN I  INFLUENZA PANEL BY PCR (TYPE A & B, H1N1)  URINE RAPID DRUG SCREEN (HOSP PERFORMED)  HIV ANTIBODY (ROUTINE TESTING)  LEGIONELLA ANTIGEN, URINE  STREP PNEUMONIAE URINARY ANTIGEN   Imaging Review Dg Chest Port 1 View  09/17/2013   CLINICAL DATA:  Respiratory distress  EXAM: PORTABLE CHEST - 1 VIEW  COMPARISON:  May 28, 2013  FINDINGS: There is left lower lobe consolidation. Lungs elsewhere clear. Heart is upper normal in size with normal pulmonary vascularity. No adenopathy. No bone lesions.  IMPRESSION: Left lower lobe consolidation.   Electronically Signed   By: Bretta Bang M.D.   On: 09/17/2013 10:20     EKG Interpretation None      MDM   Final diagnoses:  Community acquired pneumonia  Asthma   Patient presents with cough and shortness of breath. Wheezing on exam. Noted to have a temperature of 101.2. Basic labwork was obtained and notable for leukocytosis. Chest x-ray shows pneumonia. No risk factors for hospital-acquired pneumonia. Patient was given a duo neb and steroids given her asthma history.  Patient was also given Levaquin. On repeat examination, patient continues to be tachypneic with minimal wheezing. Given history of asthma and clinical picture, the patient would benefit from observation and neb treatments overnight in the hospital.    Shon Baton, MD 09/17/13 [phone]

## 2013-09-17 NOTE — Progress Notes (Signed)
Received pt assignment at 1507. Called ED Verlon Au(Leslie) for report at 1516. Was tols Verlon AuLeslie is in a code, and needs to call back. Awaiting report.

## 2013-09-17 NOTE — Progress Notes (Signed)
Dayshift RN Verdon CumminsJesse spoke with MD due to pt being agitated. MD ordered Ativan 1mg  IVx1. Will continue to monitor pt. Nelda MarseilleJenny Thacker, RN

## 2013-09-17 NOTE — ED Notes (Signed)
Arrived to ed with breathing treatment going from ems

## 2013-09-17 NOTE — ED Notes (Signed)
Sputum cup labelled/placed in room and explained to pt. has productive cough.

## 2013-09-17 NOTE — H&P (Signed)
Date: 09/17/2013               Patient Name:  Vanessa Wallace MRN: [id number]  DOB: 1956/03/08 Age / Sex: 58 y.o., female   PCP: Provider Not In System              Medical Service: Internal Medicine Teaching Service              Attending Physician: Dr. Jonah BlueAlejandro Paya, DO    First Contact: Willaim RayasEmery Nova Evett, MS 4 Pager: [phone]  Second Contact: Dr. Leonia ReevesSoli Kennerly Pager: [phone]            After Hours (After 5p/  Dr. Andrey CampanileWilson Pager: [phone]  weekends / holidays): Dr. Virgina OrganQureshi Pager: [phone]   Chief Complaint: Cough  History of Present Illness: Vanessa Wallace is a 58 year old female with a PMH of hypertension, asthma, and cocaine abuse [facility] with a 2 week cough that became acutely worse today.  She reports that she started coughing about two weeks ago and she felt that this was due to her asthma.  She tried to make an appointment to see her new PCP, but her appointments were cancelled due to inclement weather and she has been unable to see him yet.  This morning, she started feeling much worse and her chest started to hurt while coughing.  She also began coughing up a bit of blood in her sputum.  She denied taking her temperature at home.  She denies any sick contacts.  She typically uses both Albuterol and Symbicort for her asthma.  She denies any chest pain except her left rib pain while coughing.  She denies any shortness of breath.  All other reviews of symptoms were negative.  In the ED, her CXR showed a left lower lobe consolidation.  Blood cultures were drawn.   Levaquin 500mg  oral was given, as well as Prednisone 60mg .  Meds: No current facility-administered medications for this encounter.   Current Outpatient Prescriptions  Medication Sig Dispense Refill  . albuterol (PROVENTIL HFA;VENTOLIN HFA) 108 (90 BASE) MCG/ACT inhaler Inhale into the lungs every 6 (six) hours as needed for wheezing or shortness of breath (wheezing).      . budesonide-formoterol (SYMBICORT)  160-4.5 MCG/ACT inhaler Inhale 2 puffs into the lungs 2 (two) times daily.      Marland Kitchen. lisinopril-hydrochlorothiazide (PRINZIDE,ZESTORETIC) 20-25 MG per tablet Take 1 tablet by mouth daily.      . Pseudoeph-Doxylamine-DM-APAP (DAYQUIL/NYQUIL COLD/FLU RELIEF PO) Take 1 capsule by mouth 2 (two) times daily as needed (for cold symptoms).        Allergies: Allergies as of 09/17/2013  . (No Known Allergies)   Past Medical History  Diagnosis Date  . Asthma   . Hypertension    History reviewed. No pertinent past surgical history. History reviewed. No pertinent family history. History   Social History  . Marital Status: Single    Spouse Name: N/A    Number of Children: N/A  . Years of Education: N/A   Occupational History  . Not on file.   Social History Main Topics  . Smoking status: Current Every Day Smoker -- 0.30 packs/day    Types: Cigarettes  . Smokeless tobacco: Not on file  . Alcohol Use: Yes     Comment: Last drink 2 days ago  . Drug Use: No     Comment: Previous history of cocaine abuse  . Sexual Activity: Not on file   Other Topics Concern  .  Not on file   Social History Narrative  . No narrative on file    Review of Systems: A comprehensive review of systems was negative except for: Constitutional: positive for chills and fevers Eyes: positive for eye drainage Respiratory: positive for asthma, cough and pleurisy/chest pain Cardiovascular: positive for chest pain  Physical Exam: Blood pressure 151/81, pulse 102, temperature 101.2 F (38.4 C), temperature source Oral, resp. rate 20, weight 70.761 kg (156 lb), SpO2 97.00%. BP 151/81  Pulse 102  Temp(Src) 101.2 F (38.4 C) (Oral)  Resp 20  Wt 70.761 kg (156 lb)  SpO2 97% General appearance: alert and orieted, however tremulous onexam, very sleepy, and somewhat agitated Eyes: Pupils equal bilaterally, increased tearing Throat: normal findings: oropharynx pink & moist without lesions or evidence of  thrush Lungs: Rhigh lobe clear to auscultation, crackles in left lower lobe, wheezing at both apices Heart: regular rate and rhythm, S1, S2 normal, no murmur, click, rub or gallop Abdomen: soft, non-tender; bowel sounds normal; no masses,  no organomegaly and tender to palpation along rib cage in left upper quadrant  Lab results: Results for orders placed during the hospital encounter of 09/17/13 (from the past 24 hour(s))  CBC WITH DIFFERENTIAL     Status: Abnormal   Collection Time    09/17/13 10:04 AM      Result Value Ref Range   WBC 15.7 (*) 4.0 - 10.5 K/uL   RBC 4.16  3.87 - 5.11 MIL/uL   Hemoglobin 12.1  12.0 - 15.0 g/dL   HCT 16.1 (*) 09.6 - 04.5 %   MCV 86.1  78.0 - 100.0 fL   MCH 29.1  26.0 - 34.0 pg   MCHC 33.8  30.0 - 36.0 g/dL   RDW 40.9  81.1 - 91.4 %   Platelets 269  150 - 400 K/uL   Neutrophils Relative % 87 (*) 43 - 77 %   Neutro Abs 13.6 (*) 1.7 - 7.7 K/uL   Lymphocytes Relative 6 (*) 12 - 46 %   Lymphs Abs 1.0  0.7 - 4.0 K/uL   Monocytes Relative 7  3 - 12 %   Monocytes Absolute 1.1 (*) 0.1 - 1.0 K/uL   Eosinophils Relative 0  0 - 5 %   Eosinophils Absolute 0.0  0.0 - 0.7 K/uL   Basophils Relative 0  0 - 1 %   Basophils Absolute 0.0  0.0 - 0.1 K/uL  BASIC METABOLIC PANEL     Status: Abnormal   Collection Time    09/17/13 10:04 AM      Result Value Ref Range   Sodium 136 (*) 137 - 147 mEq/L   Potassium 3.4 (*) 3.7 - 5.3 mEq/L   Chloride 98  96 - 112 mEq/L   CO2 24  19 - 32 mEq/L   Glucose, Bld 123 (*) 70 - 99 mg/dL   BUN 14  6 - 23 mg/dL   Creatinine, Ser 7.82  0.50 - 1.10 mg/dL   Calcium 9.1  8.4 - 95.6 mg/dL   GFR calc non Af Amer >90  >90 mL/min   GFR calc Af Amer >90  >90 mL/min  TROPONIN I     Status: None   Collection Time    09/17/13 10:04 AM      Result Value Ref Range   Troponin I <0.30  <0.30 ng/mL  INFLUENZA PANEL BY PCR (TYPE A & B, H1N1)     Status: None   Collection Time    09/17/13  12:08 PM      Result Value Ref Range   Influenza A  By PCR NEGATIVE  NEGATIVE   Influenza B By PCR NEGATIVE  NEGATIVE   H1N1 flu by pcr NOT DETECTED  NOT DETECTED     Imaging results:  Dg Chest Port 1 View  09/17/2013   CLINICAL DATA:  Respiratory distress  EXAM: PORTABLE CHEST - 1 VIEW  COMPARISON:  May 28, 2013  FINDINGS: There is left lower lobe consolidation. Lungs elsewhere clear. Heart is upper normal in size with normal pulmonary vascularity. No adenopathy. No bone lesions.  IMPRESSION: Left lower lobe consolidation.   Electronically Signed   By: Bretta Bang M.D.   On: 09/17/2013 10:20    Other results: EKG: normal EKG, normal sinus rhythm, unchanged from previous tracings.  Assessment & Plan by Problem: Active Problems:   Pneumonia  Vanessa Wallace is a 58 year old woman with asthma and hypertension who presents to the ED with a cough, likely due to community acquired pneumonia.  #CAP: Cough for two weeks, getting acutely worse today.  CXR showed left lower lobe consolidation.  WBC elevated, febrile.  CURB65 score of 0, PORT score 48, but will still receive inpatient treatment given her mental status and ill appearance, as well as asthma history with wheezing on exam. Flu swab negative. - Change antibiotics to Ceftriaxone 1g IV and Azithromycin 500mg  oral - Blood cultures pending, sputum cultures, gram stain pending - Strep pneumonia, Legionella urine antigen pending - Tylenol and Mucinex as needed - Inhalers as below  #Asthma: History of asthma, with home meds of Albuterol and Symbicort.  Increased coughing over the past two weeks. - Continue home Albuterol and Symbicort inhalers  #Hypertension:  Taking Lisinopril-HCTZ 20-25 at home.  BP on exam 151/81.  - Continue home Lisinopril-HCTZ  #History of cocaine abuse: Patient has a history of cocaine abuse, with last UDS in 2010.  She denied any drug use, however she was very shaky on exam, somnolent, with increased tearing. - Obtain UDS - HIV screening  #Prophylaxis:  Heparin SQ  #Diet: Regular  This is a Psychologist, occupational Note.  The care of the patient was discussed with Dr. Leonia Reeves and the assessment and plan was formulated with their assistance.  Please see their note for official documentation of the patient encounter.   Signed: Roderic Palau, Med Student 09/17/2013, 2:42 PM

## 2013-09-17 NOTE — Progress Notes (Signed)
Pt admitted to the unit at 1637. Pt mental status is A&O. Pt oriented to room, staff, and call bell. Skin is intact. Full assessment charted in [facility]. Call bell within reach. Visitor guidelines reviewed w/ pt and/or family.

## 2013-09-17 NOTE — ED Notes (Signed)
C/o  Left lateral chest pain only when coughing.

## 2013-09-17 NOTE — Consult Note (Signed)
Pharmacy Consult - Renal Adjustment of Antibiotics  Azithromycin and CTX was started for CAP. The patient's est GFR > 90,  Scr 0.73. Medication doses are appropriate with no renal adjustments required.  Anticipate no further adjustments needed.  Pharmacy will sign off.  Thank you.   Suhaylah Wampole, Elisha HeadlandEarle J,  Pharm.D. , 09/17/2013, 4:15 PM

## 2013-09-17 NOTE — H&P (Signed)
  I have seen and examined the patient myself, and I have reviewed the note by Willaim RayasEmery Harris, MS IV and was present during the interview and physical exam.  Please see my separate H&P for additional findings, assessment, and plan.   Signed: Ky BarbanSolianny D Nolawi Kanady, MD 09/17/2013, 11:22 PM

## 2013-09-17 NOTE — ED Notes (Signed)
Patient states she is always sob and uses her inhaler twice /day. States today at 5am started seeing some blood when she would cough. States not everytime she called just occ.

## 2013-09-17 NOTE — Progress Notes (Signed)
Gave pt incenttive spirometer and instructed pt how to use and use q1h while awake. Pt stated understanding. Will continue to monitor pt. Nelda MarseilleJenny Thacker, RN

## 2013-09-17 NOTE — H&P (Signed)
Date: 09/17/2013               Patient Name:  Vanessa Wallace MRN: [id number]  DOB: 1956/05/17 Age / Sex: 58 y.o., female   PCP: Provider Not In System              Medical Service: Internal Medicine Teaching Service     I have reviewed the note by Willaim Rayas MS IV and was present during the interview and physical exam.  Please see below for findings, assessment, and plan.  Chief Complaint: Cough  History of Present Illness:  Vanessa Wallace is a 58 year old woman with PMH of hypertension, asthma, and cocaine abuse who presents to the [facility] with 2 weeks of a cough productive of yellow sputum and dyspnea that have gradually worsened. She made an appointment to see her new PCP but her appointments were cancelled due to inclement weather last week. This morning, she started feeling much worse, she developed pleuritic chest pain, and her sputum became tinged with blood. She denies any sick contacts, fever/chills, N/V/D, or decreased intake per mouth. She typically uses both Albuterol and Symbicort for her asthma but these have not helped improve her dyspnea. Her chest pain is located in her left lower chest, described as an ache that is worse with cough or deep inspiration.    In the ED, her CXR showed a left lower lobe consolidation. She had a nebulizer treatment with stable O2 saturation in the 97-100% range on room air but remained mildly tachypneic.    Medications Prior to Admission  Medication Sig Dispense Refill  . albuterol (PROVENTIL HFA;VENTOLIN HFA) 108 (90 BASE) MCG/ACT inhaler Inhale into the lungs every 6 (six) hours as needed for wheezing or shortness of breath (wheezing).      . budesonide-formoterol (SYMBICORT) 160-4.5 MCG/ACT inhaler Inhale 2 puffs into the lungs 2 (two) times daily.      Marland Kitchen lisinopril-hydrochlorothiazide (PRINZIDE,ZESTORETIC) 20-25 MG per tablet Take 1 tablet by mouth daily.      . Pseudoeph-Doxylamine-DM-APAP (DAYQUIL/NYQUIL COLD/FLU RELIEF PO) Take 1 capsule by mouth  2 (two) times daily as needed (for cold symptoms).        Meds: Current Facility-Administered Medications  Medication Dose Route Frequency Provider Last Rate Last Dose  . 0.9 %  sodium chloride infusion  250 mL Intravenous PRN Ky Barban, MD      . albuterol (PROVENTIL) (2.5 MG/3ML) 0.083% nebulizer solution 2.5 mg  2.5 mg Nebulization Q6H Ky Barban, MD      . azithromycin (ZITHROMAX) tablet 500 mg  500 mg Oral Q24H Ky Barban, MD   500 mg at 09/17/13 1603  . budesonide-formoterol (SYMBICORT) 160-4.5 MCG/ACT inhaler 2 puff  2 puff Inhalation BID Ky Barban, MD      . cefTRIAXone (ROCEPHIN) 1 g in dextrose 5 % 50 mL IVPB  1 g Intravenous Q24H Ky Barban, MD 100 mL/hr at 09/17/13 1604 1 g at 09/17/13 1604  . guaiFENesin (MUCINEX) 12 hr tablet 600 mg  600 mg Oral BID Ky Barban, MD   600 mg at 09/17/13 2238  . heparin injection 5,000 Units  5,000 Units Subcutaneous 3 times per day Ky Barban, MD   5,000 Units at 09/17/13 2238  . lisinopril (PRINIVIL,ZESTRIL) tablet 20 mg  20 mg Oral Daily Ky Barban, MD   20 mg at 09/17/13 1810   And  . hydrochlorothiazide (HYDRODIURIL) tablet 25 mg  25 mg Oral Daily Reise Hietala  Mervyn Skeeters, MD   25 mg at 09/17/13 1810  . ipratropium (ATROVENT) nebulizer solution 0.5 mg  0.5 mg Nebulization Q6H Ky Barban, MD      . sodium chloride 0.9 % injection 3 mL  3 mL Intravenous Q12H Ky Barban, MD      . sodium chloride 0.9 % injection 3 mL  3 mL Intravenous Q12H Ky Barban, MD   3 mL at 09/17/13 2238  . sodium chloride 0.9 % injection 3 mL  3 mL Intravenous PRN Ky Barban, MD        Allergies: Allergies as of 09/17/2013  . (No Known Allergies)    Past Medical History: Medical Student note reviewed  Family History: Medical Student note reviewed  Social History: Medical Student note reviewed  Surgical History: Medical Student note reviewed  Review of  System: Medical Student note reviewed  Physical Exam: Blood pressure 160/91, pulse 118, temperature 99.7 F (37.6 C), temperature source Oral, resp. rate 20, height 5\' 1"  (1.549 m), weight 125 lb 6.4 oz (56.881 kg), SpO2 95.00%. General appearance: Alert and following commands but tremulous and somewhat agitated with diaphoresis HEENT: PERRL, watery eyes, moist mucus membranes, oropharynx pink & moist without lesions or evidence of thrush, wearing dentures Chest: TTP along the left lower chest over the lower rib cage with no bruising or deformity Lungs: Bibasilar lung crackles, expiratory wheezing at both apices  Heart: Regular rate and rhythm, S1, S2 normal, no murmur, click, rub or gallop  Abdomen: soft, non tender, non distended, bowel sounds normal, no masses, no organomegaly Extremities: No clubbing or cyanosis, DP pulses 2+ bilaterally and symmetrically Neurology: Alert and Oriented x3, moves all extremities voluntarily   Labs: Reviewed as noted in the Electronic Record  Imaging: Reviewed as noted in the Electronic Record  Other results: EKG: sinus tachycardia with HR of 115, LVH, questionable ST depression in inferior leads, borderline prolonged QTc (491)  Assessment & Plan by Problem: Vanessa Wallace is a 58 year old woman with asthma, hypertension, and history of cocaine use who presents to the ED with a cough, likely due to community acquired pneumonia.  #CAP: Cough for two weeks, getting acutely worse today. CXR showed left lower lobe consolidation. WBC elevated, febrile. CURB65 score of 0, PORT score 48, but with tachypnea as asthma history with wheezing on exam. Flu swab negative.  - Telemetry bed - Change antibiotics to Ceftriaxone 1g IV and Azithromycin 500mg  oral  - Blood cultures pending, sputum cultures, gram stain pending  - Strep pneumonia, Legionella urine antigen pending  - Tylenol and Mucinex as needed  - Duo Nebs  #Asthma: History of asthma, with home meds of  Albuterol and Symbicort. Increased coughing over the past two weeks.  - Continue home Albuterol and Symbicort inhalers plus DuoNebs per above  #Hypertension: Taking Lisinopril-HCTZ 20-25 at home. BP on exam 151/81.  - Continue home Lisinopril-HCTZ   #History of cocaine abuse: Patient has a history of cocaine abuse, with last UDS in 2010. She denied any drug use, however she was diaphoretic tremulous with increased tearing, and tachycardia this morning.  - Obtain UDS  - HIV screening    #Prophylaxis: Heparin SQ   #Diet: Regular   Dispo: Disposition is deferred at this time, awaiting improvement of current medical problems. Anticipated discharge in approximately 1-2 day(s).   The patient does have a current PCP and does not need an [facility] hospital follow-up appointment after discharge.  The patient does not have  transportation limitations that hinder transportation to clinic appointments.  Signed: Ky BarbanSolianny D Dezarae Mcclaran, MD 09/17/2013, 9:20PM

## 2013-09-18 DIAGNOSIS — F1410 Cocaine abuse, uncomplicated: Secondary | ICD-10-CM

## 2013-09-18 DIAGNOSIS — R079 Chest pain, unspecified: Secondary | ICD-10-CM

## 2013-09-18 LAB — BASIC METABOLIC PANEL
BUN: 13 mg/dL (ref 6–23)
CO2: 26 mEq/L (ref 19–32)
Calcium: 9.2 mg/dL (ref 8.4–10.5)
Chloride: 104 mEq/L (ref 96–112)
Creatinine, Ser: 0.84 mg/dL (ref 0.50–1.10)
GFR calc Af Amer: 88 mL/min — ABNORMAL LOW (ref >90)
GFR calc non Af Amer: 76 mL/min — ABNORMAL LOW (ref >90)
Glucose, Bld: 90 mg/dL (ref 70–99)
Potassium: 3.5 mEq/L — ABNORMAL LOW (ref 3.7–5.3)
Sodium: 143 mEq/L (ref 137–147)

## 2013-09-18 LAB — MAGNESIUM: Magnesium: 2.3 mg/dL (ref 1.5–2.5)

## 2013-09-18 LAB — CBC
HCT: 33.6 % — ABNORMAL LOW (ref 36.0–46.0)
Hemoglobin: 11.1 g/dL — ABNORMAL LOW (ref 12.0–15.0)
MCH: 28.6 pg (ref 26.0–34.0)
MCHC: 33 g/dL (ref 30.0–36.0)
MCV: 86.6 fL (ref 78.0–100.0)
Platelets: 292 10*3/uL (ref 150–400)
RBC: 3.88 MIL/uL (ref 3.87–5.11)
RDW: 15 % (ref 11.5–15.5)
WBC: 18.3 10*3/uL — ABNORMAL HIGH (ref 4.0–10.5)

## 2013-09-18 LAB — TROPONIN I
Troponin I: <0.3 ng/mL (ref <0.30)
Troponin I: <0.3 ng/mL (ref <0.30)

## 2013-09-18 LAB — LEGIONELLA ANTIGEN, URINE: LEGIONELLA ANTIGEN, URINE: NEGATIVE

## 2013-09-18 MED ORDER — METHYLPREDNISOLONE SODIUM SUCC 125 MG IJ SOLR
60.0000 mg | INTRAMUSCULAR | Status: DC
  Administered 2013-09-18 – 2013-09-19 (×2): 60 mg via INTRAVENOUS
  Filled 2013-09-18 (×2): qty 0.96

## 2013-09-18 MED ORDER — IPRATROPIUM-ALBUTEROL 0.5-2.5 (3) MG/3ML IN SOLN
3.0000 mL | Freq: Three times a day (TID) | RESPIRATORY_TRACT | Status: DC
  Administered 2013-09-18 – 2013-09-19 (×4): 3 mL via RESPIRATORY_TRACT
  Filled 2013-09-18 (×4): qty 3

## 2013-09-18 MED ORDER — ALBUTEROL SULFATE (2.5 MG/3ML) 0.083% IN NEBU: 2.5000 mg | INHALATION_SOLUTION | Freq: Three times a day (TID) | RESPIRATORY_TRACT | Status: DC

## 2013-09-18 MED ORDER — POTASSIUM CHLORIDE CRYS ER 20 MEQ PO TBCR
40.0000 meq | EXTENDED_RELEASE_TABLET | Freq: Once | ORAL | Status: AC
  Administered 2013-09-18: 40 meq via ORAL
  Filled 2013-09-18: qty 2

## 2013-09-18 MED ORDER — IPRATROPIUM BROMIDE 0.02 % IN SOLN: 0.5000 mg | Freq: Three times a day (TID) | RESPIRATORY_TRACT | Status: DC

## 2013-09-18 MED ORDER — LORAZEPAM 2 MG/ML IJ SOLN
1.0000 mg | Freq: Four times a day (QID) | INTRAMUSCULAR | Status: DC | PRN
  Administered 2013-09-18 – 2013-09-19 (×2): 1 mg via INTRAVENOUS
  Filled 2013-09-18 (×3): qty 1

## 2013-09-18 NOTE — Progress Notes (Signed)
UR completed. Patient changed to inpatient- requiring IV antibiotics.  

## 2013-09-18 NOTE — H&P (Signed)
INTERNAL MEDICINE TEACHING SERVICE Attending Admission Note  Date: 09/18/2013  Patient name: Vanessa Wallace  Medical record number: [id number]  Date of birth: Feb 28, 1956    I have seen and evaluated Vanessa Wallace and discussed their care with the Residency Team.  58 yr old female with pmhx significant for hypertension, asthma, cocaine use, presented with productive cough and SOB. She states her inhalers have not helped her. She admits to pleuritic CP. CXR showed significant LLL consolidation.  On exam, Filed Vitals:   09/18/13 1328  BP: 123/80  Pulse: 86  Temp: 98.4 F (36.9 C)  Resp: 20  O2 sat: 95% on 2 L/min [place]  GEN: AAOx3, NAD HEENT: PERRLA, EOMI, no icterus. CV: S1S2, no m/r/g, RRR. PULM: Bilat exp wheeze, mild, decreased sounds at bases. ABD/GI: Soft, NT, +BS, no guarding. LE: 2/4 pulses, no c/c/e.  I agree with tx for CAP. Tx with Rocephin and azithro is ok, but watch QTc. If continues to increase change to alternative tx such as doxycycline. Agree with Duoneb. Agree with solumedrol.  Noted cocaine use hx. Monitor for evidence of worsening hypoxemia.  Jonah BlueAlejandro Broughton Eppinger, DO, FACP [facility] 09/18/2013, 2:06 PM

## 2013-09-18 NOTE — Progress Notes (Addendum)
I have seen the patient and reviewed the daily progress note by Willaim Rayas MS IV and discussed the care of the patient with them.  See below for documentation of my findings, assessment, and plans.  Subjective: Still feels tired with shortness of breath and pleuritic chest pain in her lower left chest. Admits to ingesting cocaine mixed in drinks the night prior to her presentation.   Objective: Vital signs in last 24 hours: Filed Vitals:   09/18/13 0630 09/18/13 1022 09/18/13 1328 09/18/13 1601  BP: 127/77 137/88 123/80   Pulse:   86   Temp:   98.4 F (36.9 C)   TempSrc:   Oral   Resp:   20   Height:      Weight:      SpO2:   95% 96%   Weight change:   Intake/Output Summary (Last 24 hours) at 09/18/13 1755 Last data filed at 09/18/13 1500  Gross per 24 hour  Intake    480 ml  Output   2300 ml  Net  -1820 ml   Vitals reviewed. General: resting in bed, in NAD, somnolent HEENT: No diaphoresis or teary eyes today, no scleral icterus, MMM Cardiac: RRR, no rubs, murmurs or gallops Pulm: clear to auscultation bilaterally, no wheezes, rales, or rhonchi, mildly tachypnea (improved from yesterday)  Abd: soft, nontender, nondistended, BS present Ext: warm and well perfused, no pedal edema Neuro: alert and oriented X3, no focal Neurologic deficits, moves all extremities voluntairly  Lab Results: Reviewed and documented in Electronic Record Micro Results: Reviewed and documented in Electronic Record Studies/Results: Reviewed and documented in Electronic Record Medications: I have reviewed the patient's current medications. Scheduled Meds: . azithromycin  500 mg Oral Q24H  . budesonide-formoterol  2 puff Inhalation BID  . cefTRIAXone (ROCEPHIN)  IV  1 g Intravenous Q24H  . guaiFENesin  600 mg Oral BID  . heparin  5,000 Units Subcutaneous 3 times per day  . lisinopril  20 mg Oral Daily   And  . hydrochlorothiazide  25 mg Oral Daily  . ipratropium-albuterol  3 mL  Nebulization TID  . methylPREDNISolone (SOLU-MEDROL) injection  60 mg Intravenous Q24H  . sodium chloride  3 mL Intravenous Q12H  . sodium chloride  3 mL Intravenous Q12H   Continuous Infusions:  PRN Meds:.sodium chloride, LORazepam, sodium chloride Assessment/Plan: Vanessa Wallace is a 58 year old woman with asthma, hypertension, and history of cocaine use who presents to the ED with a cough, likely due to community acquired pneumonia.   #CAP: She presented with worsening cough x2 weeks with CXR showing left lower lobe consolidation. WBC elevated to 15.7, febrile with Tmax of 101.57F, with tachypnea with wheezing on exam. Flu swab negative. Overnight has remained afebrile with stable O2 saturation. BC NGTD, Strep pneum/Legionella Ag negative, HIV non reactive. She remains tachypneic (though this could be secondary to cocaine) with diffuse inspiratory wheezing on physical exam.  - Continue Ceftriaxone 1g IV and Azithromycin 500mg  oral (with monitoring for prolonged QTc) - Blood cultures pending, sputum cultures, gram stain pending  - Tylenol and Mucinex as needed  - IS - Duo Nebs  - Start solumedrol 60mg  daily-->may transition to prednisone PO tomorrow  #Chest pain - Described as constant, in her left lower chest, worse with cough. Likely pleuritic chest pain but given recent cocaine use, ST depression in inferior leads in initially EKG, ACS was a possibility. ACS now has now been ruled out with resolution of ST changes and negative troponin  x3.  -Continue telemetry  -Ativan PRN for agitation as below -Mucinex for cough  #Leucocytosis - WBC trending up, likely 2/2 CAP with worsening likely due to steroid use as pt remains afebrile.   #Hypokalemia - Mild, likely due to DuoNeb use and decreased intake per mouth.  -Kdur 40mEq once  #Asthma: History of asthma, with home meds of Albuterol and Symbicort. Increased coughing over the past two weeks.  - Continue home Albuterol and Symbicort inhalers  plus DuoNebs and add steroid per above   #Hypertension: Taking Lisinopril-HCTZ 20-25 at home. BP of 151/81 on presentation with cocaine likely driving her BP up. BP improved today to 123/80. - Continue home Lisinopril-HCTZ   #History of cocaine abuse: Patient ingested 2 drinks with cocaine in them on the eve of 3/4. Patient states that this was a larger amount of cocaine. Patient counseling on abstaining from using this drug. HIV negative. - Ativan 1mg  q6hr PRN for agitation/euphoria  #Prolonged QTc - EKG on presentation with QTc of 491, down to 481 today.  -Continue monitoring, review telemetry -Will consider discontinuing azithromycin if persistent (could use doxycycline)  #Prophylaxis: Heparin SQ   #Diet: Regular   Dispo: Disposition is deferred at this time, awaiting improvement of current medical problems.  Anticipated discharge in approximately 1 day.   The [facility]  and [facility].  The patient does not have transportation limitations that hinder transportation to clinic appointments.  .Services Needed at time of discharge: Y = Yes, Blank = No PT:   OT:   RN:   Equipment:   Other:     LOS: 1 day   Ky BarbanSolianny D Kella Splinter, MD 09/18/2013, 5:55 PM

## 2013-09-18 NOTE — Care Management Note (Signed)
    Page 1 of 1   09/18/2013     4:08:32 PM   CARE MANAGEMENT NOTE 09/18/2013  Patient:  Vanessa Wallace,Vanessa Wallace   Account Number:  [id number]  Date Initiated:  09/18/2013  Documentation initiated by:  Letha CapeAYLOR,Akaila Rambo  Subjective/Objective Assessment:   dx pna  admit- from home.     Action/Plan:   Anticipated DC Date:  09/20/2013   Anticipated DC Plan:  HOME/SELF CARE      DC Planning Services  CM consult      Choice offered to / List presented to:             Status of service:  In process, will continue to follow Medicare Important Message given?   (If response is "NO", the following Medicare IM given date fields will be blank) Date Medicare IM given:   Date Additional Medicare IM given:    Discharge Disposition:    Per UR Regulation:    If discussed at Long Length of Stay Meetings, dates discussed:    Comments:  09/18/13 1604 Letha Capeeborah Wynn Alldredge RN, BSN [phone] patient is from home, pta indep.  Patient states she has medicaid for her medications.

## 2013-09-18 NOTE — Progress Notes (Signed)
Chaplain provided pastoral care and support to patient. Chaplain provided ministry of presence. Chaplain provided empathic listening as the patient shared her narrative. Patient said "she is concerned about her father and her husband." Chaplain provided spiritual support through prayer.   09/18/13 1300  Clinical Encounter Type  Visited With Patient  Visit Type Initial;Spiritual support;Social support  Referral From Patient  Spiritual Encounters  Spiritual Needs Prayer;Emotional  Stress Factors  Patient Stress Factors Family relationships

## 2013-09-18 NOTE — Progress Notes (Signed)
Subjective: Ms. Bryand reports feeling about the same this morning.  She is having difficulty laying on her left side due to his chest pain.  She reports that her cough has been about the same since yesterday.  She denies any chest pain.  She reports that she used cocaine yesterday -- she had 2 drinks with cocaine in them.  She says she has never used cocaine via this method before, but likely had more than she typically has.    Objective: Vital signs in last 24 hours: Filed Vitals:   09/17/13 2043 09/18/13 0618 09/18/13 0630 09/18/13 1022  BP: 160/91  127/77 137/88  Pulse: 118 97    Temp: 99.7 F (37.6 C) 98.2 F (36.8 C)    TempSrc: Oral Oral    Resp: 20 20    Height:      Weight:      SpO2: 95% 94%     Weight change:   Intake/Output Summary (Last 24 hours) at 09/18/13 1122 Last data filed at 09/18/13 0705  Gross per 24 hour  Intake    480 ml  Output   1700 ml  Net  -1220 ml   BP 137/88  Pulse 97  Temp(Src) 98.2 F (36.8 C) (Oral)  Resp 20  Ht 5\' 1"  (1.549 m)  Wt 56.881 kg (125 lb 6.4 oz)  BMI 23.71 kg/m2  SpO2 94% General appearance: alert, cooperative, mild distress and some somnolence. Periods of difficulty concentrating on questions. Lungs: Wheezing bilaterally.  No crackles heard on exam.  Tachypnic.  Not wearing oxygen at time of exam. Heart: regular rate and rhythm, S1, S2 normal, no murmur, click, rub or gallop Neurologic: Grossly normal  Lab Results: Results for orders placed during the hospital encounter of 09/17/13 (from the past 24 hour(s))  INFLUENZA PANEL BY PCR (TYPE A & B, H1N1)     Status: None   Collection Time    09/17/13 12:08 PM      Result Value Ref Range   Influenza A By PCR NEGATIVE  NEGATIVE   Influenza B By PCR NEGATIVE  NEGATIVE   H1N1 flu by pcr NOT DETECTED  NOT DETECTED  HIV ANTIBODY (ROUTINE TESTING)     Status: None   Collection Time    09/17/13  3:02 PM      Result Value Ref Range   HIV NON REACTIVE  NON REACTIVE  CULTURE,  BLOOD (ROUTINE X 2)     Status: None   Collection Time    09/17/13  3:20 PM      Result Value Ref Range   Specimen Description BLOOD RIGHT ANTECUBITAL     Special Requests       Value: BOTTLES DRAWN AEROBIC AND ANAEROBIC 10CC AER 8CC ANA   Culture  Setup Time       Value: 09/17/2013 21:28     Performed at [facility]   Culture       Value:        BLOOD CULTURE RECEIVED NO GROWTH TO DATE CULTURE WILL BE HELD FOR 5 DAYS BEFORE ISSUING A FINAL NEGATIVE REPORT     Performed at [facility]   Report Status PENDING    CULTURE, BLOOD (ROUTINE X 2)     Status: None   Collection Time    09/17/13  3:26 PM      Result Value Ref Range   Specimen Description BLOOD LEFT ANTECUBITAL     Special Requests BOTTLES DRAWN AEROBIC ONLY 10CC  Culture  Setup Time       Value: 09/17/2013 21:28     Performed at [facility]   Culture       Value:        BLOOD CULTURE RECEIVED NO GROWTH TO DATE CULTURE WILL BE HELD FOR 5 DAYS BEFORE ISSUING A FINAL NEGATIVE REPORT     Performed at [facility]   Report Status PENDING    CULTURE, EXPECTORATED SPUTUM-ASSESSMENT     Status: None   Collection Time    09/17/13  6:13 PM      Result Value Ref Range   Specimen Description SPUTUM     Special Requests NONE     Sputum evaluation       Value: THIS SPECIMEN IS ACCEPTABLE. RESPIRATORY CULTURE REPORT TO FOLLOW.   Report Status 09/17/2013 FINAL    CULTURE, RESPIRATORY (NON-EXPECTORATED)     Status: None   Collection Time    09/17/13  6:13 PM      Result Value Ref Range   Specimen Description SPUTUM     Special Requests NONE     Gram Stain       Value: FEW WBC PRESENT,BOTH PMN AND MONONUCLEAR     RARE SQUAMOUS EPITHELIAL CELLS PRESENT     RARE GRAM POSITIVE COCCI     IN PAIRS RARE GRAM POSITIVE RODS     RARE GRAM NEGATIVE RODS   Culture PENDING     Report Status PENDING    URINE RAPID DRUG SCREEN (HOSP PERFORMED)     Status: Abnormal   Collection Time    09/17/13  7:56 PM       Result Value Ref Range   Opiates NONE DETECTED  NONE DETECTED   Cocaine POSITIVE (*) NONE DETECTED   Benzodiazepines NONE DETECTED  NONE DETECTED   Amphetamines NONE DETECTED  NONE DETECTED   Tetrahydrocannabinol NONE DETECTED  NONE DETECTED   Barbiturates NONE DETECTED  NONE DETECTED  STREP PNEUMONIAE URINARY ANTIGEN     Status: None   Collection Time    09/17/13  7:56 PM      Result Value Ref Range   Strep Pneumo Urinary Antigen NEGATIVE  NEGATIVE  BASIC METABOLIC PANEL     Status: Abnormal   Collection Time    09/18/13  5:51 AM      Result Value Ref Range   Sodium 143  137 - 147 mEq/L   Potassium 3.5 (*) 3.7 - 5.3 mEq/L   Chloride 104  96 - 112 mEq/L   CO2 26  19 - 32 mEq/L   Glucose, Bld 90  70 - 99 mg/dL   BUN 13  6 - 23 mg/dL   Creatinine, Ser 0.98  0.50 - 1.10 mg/dL   Calcium 9.2  8.4 - 11.9 mg/dL   GFR calc non Af Amer 76 (*) >90 mL/min   GFR calc Af Amer 88 (*) >90 mL/min  CBC     Status: Abnormal   Collection Time    09/18/13  5:51 AM      Result Value Ref Range   WBC 18.3 (*) 4.0 - 10.5 K/uL   RBC 3.88  3.87 - 5.11 MIL/uL   Hemoglobin 11.1 (*) 12.0 - 15.0 g/dL   HCT 14.7 (*) 82.9 - 56.2 %   MCV 86.6  78.0 - 100.0 fL   MCH 28.6  26.0 - 34.0 pg   MCHC 33.0  30.0 - 36.0 g/dL   RDW 13.0  86.5 -  15.5 %   Platelets 292  150 - 400 K/uL  TROPONIN I     Status: None   Collection Time    09/18/13  5:51 AM      Result Value Ref Range   Troponin I <0.30  <0.30 ng/mL    Micro Results: Recent Results (from the past 240 hour(s))  CULTURE, BLOOD (ROUTINE X 2)     Status: None   Collection Time    09/17/13  3:20 PM      Result Value Ref Range Status   Specimen Description BLOOD RIGHT ANTECUBITAL   Final   Special Requests     Final   Value: BOTTLES DRAWN AEROBIC AND ANAEROBIC 10CC AER 8CC ANA   Culture  Setup Time     Final   Value: 09/17/2013 21:28     Performed at [facility]   Culture     Final   Value:        BLOOD CULTURE RECEIVED NO GROWTH TO  DATE CULTURE WILL BE HELD FOR 5 DAYS BEFORE ISSUING A FINAL NEGATIVE REPORT     Performed at [facility]   Report Status PENDING   Incomplete  CULTURE, BLOOD (ROUTINE X 2)     Status: None   Collection Time    09/17/13  3:26 PM      Result Value Ref Range Status   Specimen Description BLOOD LEFT ANTECUBITAL   Final   Special Requests BOTTLES DRAWN AEROBIC ONLY 10CC   Final   Culture  Setup Time     Final   Value: 09/17/2013 21:28     Performed at [facility]   Culture     Final   Value:        BLOOD CULTURE RECEIVED NO GROWTH TO DATE CULTURE WILL BE HELD FOR 5 DAYS BEFORE ISSUING A FINAL NEGATIVE REPORT     Performed at [facility]   Report Status PENDING   Incomplete  CULTURE, EXPECTORATED SPUTUM-ASSESSMENT     Status: None   Collection Time    09/17/13  6:13 PM      Result Value Ref Range Status   Specimen Description SPUTUM   Final   Special Requests NONE   Final   Sputum evaluation     Final   Value: THIS SPECIMEN IS ACCEPTABLE. RESPIRATORY CULTURE REPORT TO FOLLOW.   Report Status 09/17/2013 FINAL   Final  CULTURE, RESPIRATORY (NON-EXPECTORATED)     Status: None   Collection Time    09/17/13  6:13 PM      Result Value Ref Range Status   Specimen Description SPUTUM   Final   Special Requests NONE   Final   Gram Stain     Final   Value: FEW WBC PRESENT,BOTH PMN AND MONONUCLEAR     RARE SQUAMOUS EPITHELIAL CELLS PRESENT     RARE GRAM POSITIVE COCCI     IN PAIRS RARE GRAM POSITIVE RODS     RARE GRAM NEGATIVE RODS   Culture PENDING   Incomplete   Report Status PENDING   Incomplete   Studies/Results: Dg Chest Port 1 View  09/17/2013   CLINICAL DATA:  Respiratory distress  EXAM: PORTABLE CHEST - 1 VIEW  COMPARISON:  May 28, 2013  FINDINGS: There is left lower lobe consolidation. Lungs elsewhere clear. Heart is upper normal in size with normal pulmonary vascularity. No adenopathy. No bone lesions.  IMPRESSION: Left lower lobe consolidation.    Electronically Signed  By: Bretta BangWilliam  Woodruff M.D.   On: 09/17/2013 10:20   Medications: I have reviewed the patient's current medications. Scheduled Meds: . azithromycin  500 mg Oral Q24H  . budesonide-formoterol  2 puff Inhalation BID  . cefTRIAXone (ROCEPHIN)  IV  1 g Intravenous Q24H  . guaiFENesin  600 mg Oral BID  . heparin  5,000 Units Subcutaneous 3 times per day  . lisinopril  20 mg Oral Daily   And  . hydrochlorothiazide  25 mg Oral Daily  . ipratropium-albuterol  3 mL Nebulization TID  . sodium chloride  3 mL Intravenous Q12H  . sodium chloride  3 mL Intravenous Q12H   Continuous Infusions:  PRN Meds:.sodium chloride, LORazepam, sodium chloride Assessment/Plan: Active Problems:   Pneumonia   Community acquired pneumonia  Assessment & Plan by Problem:  Ms. Vanessa Wallace is a 58 year old woman with asthma, hypertension, and history of cocaine use who presents to the ED with a cough, likely due to community acquired pneumonia as well as acute cocaine abuse.  #CAP: Cough for two weeks, getting acutely worse today. CXR showed left lower lobe consolidation. WBC elevated, febrile. CURB65 score of 0, PORT score 48, but with tachypnea as asthma history with wheezing on exam. Flu swab negative. Strep pneumo urine antigen negative. - Telemetry bed  - Change antibiotics to Ceftriaxone 1g IV and Azithromycin 500mg  oral  - Blood cultures no growth to date, gram stain pending  - Sputum cultures - few WBC with both gram positive cocci and rods - Legionella urine antigen pending  - Tylenol and Mucinex as needed  - Added Solumedrol 60mg  daily for asthma - Duo Nebs as needed  #Asthma: History of asthma, with home meds of Albuterol and Symbicort. Increased coughing over the past two weeks.  - Continue home Albuterol and Symbicort inhalers plus DuoNebs per above   #Hypertension: Taking Lisinopril-HCTZ 20-25 at home. BP on exam 151/81.  - Continue home Lisinopril-HCTZ   #History of cocaine  abuse: Patient has a history of cocaine abuse, with last UDS in 2010. She initially denied any drug use, however she was diaphoretic tremulous with increased tearing, and tachycardia this morning. UDS positive for cocaine, which explains tearing on exam yesterday, as well as her tachycardia.  Counseled about cocaine abuse. - HIV screening pending - Ativan PRN  #QTc Prolongation:  Noted slight QTc prolongation on initial EKG, likely due to cocaine use.  Troponins negative x3. - Will continue to monitor daily.  If becomes >500, will discontinue her azithromycin.  #Prophylaxis: Heparin SQ   #Diet: Regular   Dispo: Disposition is deferred at this time, awaiting improvement of current medical problems. Anticipated discharge in approximately 1-2 day(s).   This is a Psychologist, occupationalMedical Student Note.  The care of the patient was discussed with Dr. Leonia ReevesSoli Kennerly and the assessment and plan formulated with their assistance.  Please see their attached note for official documentation of the daily encounter.   LOS: 1 day   Roderic PalauEmery E Starla Deller, Med Student 09/18/2013, 11:22 AM

## 2013-09-18 NOTE — Progress Notes (Signed)
Addendum: above note for another patient.   For this patient, chaplain provided emotional and spiritual support. Patient request prayer for her health and for her husband. Chaplain provided spiritual support through prayer.   09/18/13 1300  Clinical Encounter Type  Visited With Patient  Visit Type Initial;Spiritual support;Social support  Referral From Patient  Spiritual Encounters  Spiritual Needs Prayer;Emotional  Stress Factors  Patient Stress Factors Family relationships

## 2013-09-19 MED ORDER — GUAIFENESIN ER 600 MG PO TB12: 600.0000 mg | ORAL_TABLET | Freq: Two times a day (BID) | ORAL | Status: DC

## 2013-09-19 MED ORDER — PREDNISONE 10 MG PO TABS: ORAL_TABLET | ORAL | Status: DC

## 2013-09-19 MED ORDER — LEVOFLOXACIN 750 MG PO TABS: 750.0000 mg | ORAL_TABLET | Freq: Every day | ORAL | Status: DC

## 2013-09-19 MED ORDER — ALBUTEROL SULFATE 0.63 MG/3ML IN NEBU: 1.0000 | INHALATION_SOLUTION | Freq: Four times a day (QID) | RESPIRATORY_TRACT | Status: DC | PRN

## 2013-09-19 NOTE — Progress Notes (Signed)
NURSING PROGRESS NOTE  Vanessa FordVivian A Wallace [id number] Discharge Data: 09/19/2013 5:03 PM Attending Provider: Jonah BlueAlejandro Paya, DO WUJ:[id number]:PROVIDER NOT IN SYSTEM   Vanessa FordVivian A Wallace to be D/C'd Home per MD order.    All IV's discontinued with no bleeding noted or complications.  All belongings returned to patient for patient to take home..   Last Documented Vital Signs:  Blood pressure 120/77, pulse 92, temperature 99 F (37.2 C), temperature source Oral, resp. rate 18, height 5\' 1"  (1.549 m), weight 56.881 kg (125 lb 6.4 oz), SpO2 93.00%.  Madelin RearLonnie Starsky Nanna, MSN, RN, [employer] EnergyCMSRN

## 2013-09-19 NOTE — Progress Notes (Signed)
   CARE MANAGEMENT NOTE 09/19/2013  Patient:  Vanessa Wallace,Vanessa Wallace   Account Number:  [id number]  Date Initiated:  09/18/2013  Documentation initiated by:  Letha CapeAYLOR,DEBORAH  Subjective/Objective Assessment:   dx pna  admit- from home.     Action/Plan:   Anticipated DC Date:  09/20/2013   Anticipated DC Plan:  HOME/SELF CARE      DC Planning Services  CM consult      Choice offered to / List presented to:     DME arranged  [facility]INE      DME agency  [facility]        Status of service:  Completed, signed off Medicare Important Message given?   (If response is "NO", the following Medicare IM given date fields will be blank) Date Medicare IM given:   Date Additional Medicare IM given:    Discharge Disposition:  HOME/SELF CARE  Per UR Regulation:    If discussed at Long Length of Stay Meetings, dates discussed:    Comments:  09/19/13 13:00 CM received call from MD concerning cost of meds for pt.  Pt does not qualify for MATCH as she has Medicaid.  Pt states she cannot afford anything for medication.  Cm asked her if she had family who could help. She stated her sister who is picking her up from the hospital has enough to deal with.  CM explained to pt the MD is prescribing prednisone and an antibiotic [facility]  Pt encouraged to get her family to help her until she can get more money together.  DME delivery person will deliver her neb machine to her room.  No other CM needs were communicated.  Vanessa JakschSarah Vicki Wallace, BSN, [place] [phone].  09/18/13 1604 Letha Capeeborah Taylor RN, BSN [phone] patient is from home, pta indep.  Patient states she has medicaid for her medications.

## 2013-09-19 NOTE — Discharge Summary (Signed)
Name: Vanessa Wallace MRN: [id number] DOB: [birth year] 58 y.o. PCP: [facility]  Date of Admission: 09/17/2013  9:17 AM Date of Discharge: 09/19/2013 Attending Physician: Dr. Kem Kays  Discharge Diagnosis: 1. Community Acquired pneumonia Active Problems:   Pneumonia   Community acquired pneumonia    Cocaine use  Discharge Medications:   Medication List    STOP taking these medications       DAYQUIL/NYQUIL COLD/FLU RELIEF PO      TAKE these medications       albuterol 0.63 MG/3ML nebulizer solution  Commonly known as:  ACCUNEB  Take 3 mLs (0.63 mg total) by nebulization every 6 (six) hours as needed for wheezing or shortness of breath.     albuterol 108 (90 BASE) MCG/ACT inhaler  Commonly known as:  PROVENTIL HFA;VENTOLIN HFA  Inhale into the lungs every 6 (six) hours as needed for wheezing or shortness of breath (wheezing).     budesonide-formoterol 160-4.5 MCG/ACT inhaler  Commonly known as:  SYMBICORT  Inhale 2 puffs into the lungs 2 (two) times daily.     guaiFENesin 600 MG 12 hr tablet  Commonly known as:  MUCINEX  Take 1 tablet (600 mg total) by mouth 2 (two) times daily.     levofloxacin 750 MG tablet  Commonly known as:  LEVAQUIN  Take 1 tablet (750 mg total) by mouth daily.     lisinopril-hydrochlorothiazide 20-25 MG per tablet  Commonly known as:  PRINZIDE,ZESTORETIC  Take 1 tablet by mouth daily.     predniSONE 10 MG tablet  Commonly known as:  DELTASONE  Take 4 tabs (40mg ) daily for 2 days, then 3 tablets (30mg  daily) for 2 days, then 2 tabs (20mg ) daily for 2 days, then 1 tabs for 2 days        Disposition and follow-up:   Ms.Arrielle A Leamer was discharged from [facility] in Stable condition.  At the hospital follow up visit please address:  1.  Patient's compliance to antibiotic (Levaquin) and albuterol nebulizer treatments.       Counsel on abstinence from cocaine.      Consider ordering PFTs once her symptoms  improve/resolve      Consider repeating EKG as she had prolonged QTc to 491 during this hospitalization- though after use of cocaine.         2.  Labs / imaging needed at time of follow-up:        -CBC for Hg and WBC trending (WBC of 18.3 on 3/6 but after steroid treatment).        -BMET for potassium trending with albuterol nebulizer.       -Repeat CXR to assess for resolution of pneumonia  3.  Pending labs/ test needing follow-up: Blood culture (NGTD at 4 days). Sputum culture final report with normal oropharynx flora.   Follow-up Appointments:     Follow-up Information   Follow up with Elvina Sidle, MD On 10/01/2013. (Time: 3:15 PM. )    Specialty:  Family Medicine   Contact information:   [facility] [phone] [facility] Montez Hageman. [address]   [place]  [place] [phone]       Discharge Instructions: Discharge Orders   Future Orders Complete By Expires   Diet - low sodium heart healthy  As directed    Increase activity slowly  As directed       Consultations:  None  Procedures Performed:  Dg Chest Port 1 View  09/17/2013   CLINICAL DATA:  Respiratory distress  EXAM: PORTABLE CHEST - 1 VIEW  COMPARISON:  May 28, 2013  FINDINGS: There is left lower lobe consolidation. Lungs elsewhere clear. Heart is upper normal in size with normal pulmonary vascularity. No adenopathy. No bone lesions.  IMPRESSION: Left lower lobe consolidation.   Electronically Signed   By: Bretta Bang M.D.   On: 09/17/2013 10:20   Admission HPI:  Ms. Vanessa Wallace is a 58 year old woman with PMH of hypertension, asthma, and cocaine abuse who presents to the [facility] with 2 weeks of a cough productive of yellow sputum and dyspnea that have gradually worsened. She made an appointment to see her new PCP but her appointments were cancelled due to inclement weather last week. This morning, she started feeling much worse, she developed pleuritic chest pain, and her sputum became tinged with blood. She denies  any sick contacts, fever/chills, N/V/D, or decreased intake per mouth. She typically uses both Albuterol and Symbicort for her asthma but these have not helped improve her dyspnea. Her chest pain is located in her left lower chest, described as an ache that is worse with cough or deep inspiration.  In the ED, her CXR showed a left lower lobe consolidation. She had a nebulizer treatment with stable O2 saturation in the 97-100% range on room air but remained mildly tachypneic.    Hospital Course by problem list:  1. Community-Acquired Pneumonia: She has had a cough for two weeks which had become acutely worse. Her CXR showed left lower lobe consolidation. Her WBC was elevated, and she was febrile. Although her CURB65 score was 0 and her PORT score was 48, given her tachypnea and asthma history she was hospitalized for further evaluation. Her flu swab was negative. She was treated with one dose of Levaquin in the ED, and her antibiotics were switched to Ceftriaxone 1g IV daily and Azithromycin 500mg  oral. Her blood cultures had NGTD prior to her discharge. Her sputum cultures showed normal oropharyngeal flora. Her strep pneumonia antigen and legionella antigen were negatvie. She was treated with Solumedrol given her wheezing on exam, as well as Duo Nebs. She was discharged on Levaquin 750mg  for three days and  Mucinex for cough. She also was set up to have a nebulizer machine delivered to her house for ongoing nebulizer treatments at home as this was felt by her to be easier to use and less costly. She expressed concern in regards to affording her medications upon discharge and Care Management was consulted but could not provided medication assistance as the patient has Medicaid and has very low co-pays for her medications. She will follow up with her PCP and will need a follow-up CXR in 4 weeks.   2. Asthma: Ms. Pettibone has a history of asthma, which is treated at home with Albuterol inhaler and Symbicort. She  had increased coughing over the past two weeks. She was treated with albuterol and symbicort while admitted and he was discharged on her home medications but with albuterol nebulizer treatments for her home use. She was discharged on a Prednisone taper of: 4 tabs (40mg ) daily for 2 days, then 3 tablets (30mg  daily) for 2 days, then 2 tabs (20mg ) daily for 2 days, then 1 tabs for 2 days as she continued to have wheezing on physical exam. Her albuterol rescue inhaler was refilled, and she was provided with a home nebulizer machine. She may benefit from outpatient PFT for evaluation of COPD v asthma in the outpatient setting.   3. Hypertension:  Taking Lisinopril-HCTZ 20-25 at home. BP on exam 151/81. Continue home Lisinopril-HCTZ   4, History of cocaine abuse: She has a history of cocaine abuse, with last UDS in 2010 positive for cocaine. She denied any drug use, however, on presentation she appeared intoxicated with cocaine as she was diaphoretic, tremulous, with increased tearing, and tachycardia.  Her UDS during this hospitalization was positive for cocaine. She admitted to drinking cocaine in a soda, and believed she had consumed more cocaine than she usually does. She was treated with Ativan PRN for her agitation and her symptoms gradually improved. She was counseled on her substance abuse but declined CSW referral as she felt that she already had the resources to stop using when she was discharged. Of note, her HIV testing was nonreactive.   5. Prolonged QTc: Her EKG on presentation had a QTc of 491, which on repeat went down to 481. This could have been due to the cocaine use. Azithromycin was administered after her initial EKG and likely did not affect her QTc prolongation. She will follow up with her PCP and may need a repeat EKG to determine if she has baseline QTc prolongation.   Discharge Vitals:   BP 120/77  Pulse 92  Temp(Src) 99 F (37.2 C) (Oral)  Resp 18  Ht 5\' 1"  (1.549 m)  Wt 125 lb  6.4 oz (56.881 kg)  BMI 23.71 kg/m2  SpO2 93%  Discharge Labs:  Basic Metabolic Panel:   Recent Labs  Lab  09/17/13 1004  09/18/13 0551  09/18/13 1129   NA  136*  143  --   K  3.4*  3.5*  --   CL  98  104  --   CO2  24  26  --   GLUCOSE  123*  90  --   BUN  14  13  --   CREATININE  0.73  0.84  --   CALCIUM  9.1  9.2  --   MG  --  --  2.3    CBC:   Recent Labs  Lab  09/17/13 1004  09/18/13 0551   WBC  15.7*  18.3*   NEUTROABS  13.6*  --   HGB  12.1  11.1*   HCT  35.8*  33.6*   MCV  86.1  86.6   PLT  269  292    Cardiac Enzymes:   Recent Labs  Lab  09/17/13 1004  09/18/13 0551  09/18/13 1129   TROPONINI  <0.30  <0.30  <0.30    Urine Drug Screen:  Drugs of Abuse    Component  Value  Date/Time    LABOPIA  NONE DETECTED  09/17/2013 1956    LABOPIA  NEGATIVE  07/10/2009 0825    COCAINSCRNUR  POSITIVE*  09/17/2013 1956    COCAINSCRNUR  Value: POSITIVE (NOTE) Result repeated and verified. Sent for confirmatory testing*  07/10/2009 0825    LABBENZ  NONE DETECTED  09/17/2013 1956    LABBENZ  NEGATIVE  07/10/2009 0825    AMPHETMU  NONE DETECTED  09/17/2013 1956    AMPHETMU  NEGATIVE  07/10/2009 0825    THCU  NONE DETECTED  09/17/2013 1956    LABBARB  NONE DETECTED  09/17/2013 1956     Signed: Ky BarbanSolianny D Ashad Fawbush, MD 09/19/2013, 8:56 PM  Time Spent on Discharge: 40 minutes Services Ordered on Discharge: None Equipment Ordered on Discharge: Nebulizer machine

## 2013-09-19 NOTE — Progress Notes (Signed)
Patient up with assist of N.T. And ambulated in hall about 70 ft. with continuous pulse ox monitoring. Initial O2 sat: 92%; walking O2 sat: 94%; Final sitting O2 sat: 90%.  Patient currently in bed tolerating room air. Mertha Baarsourtney Camerin Jimenez, RN 12:24 PM

## 2013-09-19 NOTE — Progress Notes (Signed)
Subjective: She feels a little better today but still has a cough. O2 sats stable this morning on room air. She ambulated with no O2 supplementation with O2 sats above 90%.   Objective: Vital signs in last 24 hours: Filed Vitals:   09/19/13 1059 09/19/13 1100 09/19/13 1325 09/19/13 1519  BP:  126/68 120/77   Pulse:  97 92   Temp:   99 F (37.2 C)   TempSrc:   Oral   Resp:   18   Height:      Weight:      SpO2: 94% 91% 93% 93%   Weight change:   Intake/Output Summary (Last 24 hours) at 09/19/13 2244 Last data filed at 09/19/13 0604  Gross per 24 hour  Intake      0 ml  Output    650 ml  Net   -650 ml   Vitals reviewed.  General: resting in bed, in NAD, somnolent  HEENT: No diaphoresis or teary eyes today, no scleral icterus, MMM  Cardiac: RRR, no rubs, murmurs or gallops  Pulm: Diminished breath sounds at the left lower lobe, no rhonchi, expiratory wheezing at the apices, mild tachypnea (further improved from yesterday)  Abd: soft, nontender, nondistended, BS present  Ext: warm and well perfused, no pedal edema  Neuro: alert and oriented X3, no focal Neurologic deficits, moves all extremities voluntairly  Lab Results: Basic Metabolic Panel:  Recent Labs Lab 09/17/13 1004 09/18/13 0551 09/18/13 1129  NA 136* 143  --   K 3.4* 3.5*  --   CL 98 104  --   CO2 24 26  --   GLUCOSE 123* 90  --   BUN 14 13  --   CREATININE 0.73 0.84  --   CALCIUM 9.1 9.2  --   MG  --   --  2.3   CBC:  Recent Labs Lab 09/17/13 1004 09/18/13 0551  WBC 15.7* 18.3*  NEUTROABS 13.6*  --   HGB 12.1 11.1*  HCT 35.8* 33.6*  MCV 86.1 86.6  PLT 269 292   Cardiac Enzymes:  Recent Labs Lab 09/17/13 1004 09/18/13 0551 09/18/13 1129  TROPONINI <0.30 <0.30 <0.30   Urine Drug Screen: Drugs of Abuse     Component Value Date/Time   LABOPIA NONE DETECTED 09/17/2013 1956   LABOPIA NEGATIVE 07/10/2009 0825   COCAINSCRNUR POSITIVE* 09/17/2013 1956   COCAINSCRNUR  Value: POSITIVE  (NOTE) Result repeated and verified. Sent for confirmatory testing* 07/10/2009 0825   LABBENZ NONE DETECTED 09/17/2013 1956   LABBENZ NEGATIVE 07/10/2009 0825   AMPHETMU NONE DETECTED 09/17/2013 1956   AMPHETMU NEGATIVE 07/10/2009 0825   THCU NONE DETECTED 09/17/2013 1956   LABBARB NONE DETECTED 09/17/2013 1956     Micro Results: Recent Results (from the past 240 hour(s))  CULTURE, BLOOD (ROUTINE X 2)     Status: None   Collection Time    09/17/13  3:20 PM      Result Value Ref Range Status   Specimen Description BLOOD RIGHT ANTECUBITAL   Final   Special Requests     Final   Value: BOTTLES DRAWN AEROBIC AND ANAEROBIC 10CC AER 8CC ANA   Culture  Setup Time     Final   Value: 09/17/2013 21:28     Performed at [facility]   Culture     Final   Value:        BLOOD CULTURE RECEIVED NO GROWTH TO DATE CULTURE WILL BE HELD FOR 5 DAYS BEFORE  ISSUING A FINAL NEGATIVE REPORT     Performed at [facility]   Report Status PENDING   Incomplete  CULTURE, BLOOD (ROUTINE X 2)     Status: None   Collection Time    09/17/13  3:26 PM      Result Value Ref Range Status   Specimen Description BLOOD LEFT ANTECUBITAL   Final   Special Requests BOTTLES DRAWN AEROBIC ONLY 10CC   Final   Culture  Setup Time     Final   Value: 09/17/2013 21:28     Performed at [facility]   Culture     Final   Value:        BLOOD CULTURE RECEIVED NO GROWTH TO DATE CULTURE WILL BE HELD FOR 5 DAYS BEFORE ISSUING A FINAL NEGATIVE REPORT     Performed at [facility]   Report Status PENDING   Incomplete  CULTURE, EXPECTORATED SPUTUM-ASSESSMENT     Status: None   Collection Time    09/17/13  6:13 PM      Result Value Ref Range Status   Specimen Description SPUTUM   Final   Special Requests NONE   Final   Sputum evaluation     Final   Value: THIS SPECIMEN IS ACCEPTABLE. RESPIRATORY CULTURE REPORT TO FOLLOW.   Report Status 09/17/2013 FINAL   Final  CULTURE, RESPIRATORY (NON-EXPECTORATED)      Status: None   Collection Time    09/17/13  6:13 PM      Result Value Ref Range Status   Specimen Description SPUTUM   Final   Special Requests NONE   Final   Gram Stain     Final   Value: FEW WBC PRESENT,BOTH PMN AND MONONUCLEAR     RARE SQUAMOUS EPITHELIAL CELLS PRESENT     RARE GRAM POSITIVE COCCI     IN PAIRS RARE GRAM POSITIVE RODS     RARE GRAM NEGATIVE RODS   Culture     Final   Value: Culture reincubated for better growth     Performed at [facility]   Report Status PENDING   Incomplete   Studies/Results: No results found. Medications: I have reviewed the patient's current medications. Scheduled Meds:  .  azithromycin  500 mg  Oral  Q24H   .  budesonide-formoterol  2 puff  Inhalation  BID   .  cefTRIAXone (ROCEPHIN) IV  1 g  Intravenous  Q24H   .  guaiFENesin  600 mg  Oral  BID   .  heparin  5,000 Units  Subcutaneous  3 times per day   .  lisinopril  20 mg  Oral  Daily    And   .  hydrochlorothiazide  25 mg  Oral  Daily   .  ipratropium-albuterol  3 mL  Nebulization  TID   .  methylPREDNISolone (SOLU-MEDROL) injection  60 mg  Intravenous  Q24H   .  sodium chloride  3 mL  Intravenous  Q12H   .  sodium chloride  3 mL  Intravenous  Q12H    Continuous Infusions:  PRN Meds:.sodium chloride, LORazepam, sodium chloride  Assessment/Plan: Ms. Haldeman is a 58 year old woman with asthma, hypertension, and history of cocaine use who presents to the ED with a cough, likely due to community acquired pneumonia.   #CAP: She presented with worsening cough x2 weeks with CXR showing left lower lobe consolidation. WBC elevated to 15.7, febrile with Tmax of 101.43F, with tachypnea  with wheezing on exam. Flu swab negative. Overnight has remained afebrile with stable O2 saturation. BC NGTD, Strep pneum/Legionella Ag negative, HIV non reactive. Sputum culture with rare GP rods in pairs. Her tachypnea and wheezing have improved today.   - Continue Ceftriaxone 1g IV and Azithromycin  500mg  oral (with monitoring for prolonged QTc)  - Tylenol and Mucinex as needed  - IS  - Duo Nebs  - transition to prednisone PO  - Will need repeat CXR in 4 weeks for eval of resolution of PNA.   #Chest pain - Improving. Described as pain in her left lower chest, worse with cough. Likely pleuritic chest pain but given recent cocaine use, ST depression in inferior leads in initially EKG, ACS was a possibility. ACS now has now been ruled out with resolution of ST changes and negative troponin x3.  -Continue telemetry  -Ativan PRN for agitation as below  -Mucinex for cough   #Leucocytosis - WBC trended up, likely 2/2 CAP with worsening likely due to steroid use as pt remains afebrile.   #Hypokalemia - Mild, likely due to DuoNeb use and decreased intake per mouth. Kdur once on 3/6.  #Asthma: History of asthma, with home meds of Albuterol and Symbicort. Increased coughing over the past two weeks.  - Continue home Albuterol and Symbicort inhalers plus DuoNebs and add steroid per above   #Hypertension: Taking Lisinopril-HCTZ 20-25 at home. BP of 151/81 on presentation with cocaine likely driving her BP up. BP improved today to 123/80.  - Continue home Lisinopril-HCTZ   #History of cocaine abuse: Patient ingested 2 drinks with cocaine in them on the eve of 3/4. Patient states that this was a larger amount of cocaine. Patient counseling on abstaining from using this drug. HIV negative.  - Ativan 1mg  q6hr PRN for agitation/euphoria   #Prolonged QTc - EKG on presentation with QTc of 491, down to 481.  -Continue monitoring per tele -Will consider discontinuing azithromycin if persistent (could use doxycycline)   #Prophylaxis: Heparin SQ   #Diet: Regular  Dispo: Disposition is deferred at this time, awaiting improvement of current medical problems.  Anticipated discharge today.   The patient does have a current PCP at the [facility] and does not need an [facility]  hospital follow-up appointment after discharge.  The patient does not have transportation limitations that hinder transportation to clinic appointments.  .Services Needed at time of discharge: Y = Yes, Blank = No PT:   OT:   RN:   Equipment:   Other:     LOS: 2 days   Ky Barban, MD 09/19/2013, 9:20PM

## 2013-09-20 LAB — CULTURE, RESPIRATORY: Culture: NORMAL

## 2013-09-20 LAB — CULTURE, RESPIRATORY W GRAM STAIN

## 2013-09-22 DIAGNOSIS — F1490 Cocaine use, unspecified, uncomplicated: Secondary | ICD-10-CM | POA: Diagnosis present

## 2013-09-23 LAB — CULTURE, BLOOD (ROUTINE X 2)
CULTURE: NO GROWTH
Culture: NO GROWTH

## 2013-09-23 NOTE — Discharge Summary (Signed)
  Date: 09/23/2013  Patient name: Vanessa Wallace  Medical record number: [id number]  Date of birth: 11-Jul-1956   This patient has been discussed with the house staff. Please see their note for complete details. I concur with their findings and plan.  [name], DO, FACP [facility] 09/23/2013, 2:45 PM

## 2014-10-02 ENCOUNTER — Encounter (HOSPITAL_COMMUNITY): Payer: Self-pay

## 2014-10-02 ENCOUNTER — Observation Stay (HOSPITAL_COMMUNITY)
Admission: EM | Admit: 2014-10-02 | Discharge: 2014-10-03 | Disposition: A | Discharge status: Home / Self Care | Payer: Medicaid Other | Attending: Family Medicine | Admitting: Family Medicine

## 2014-10-02 ENCOUNTER — Emergency Department (HOSPITAL_COMMUNITY): Payer: Medicaid Other

## 2014-10-02 DIAGNOSIS — N179 Acute kidney failure, unspecified: Secondary | ICD-10-CM | POA: Diagnosis not present

## 2014-10-02 DIAGNOSIS — I10 Essential (primary) hypertension: Secondary | ICD-10-CM | POA: Diagnosis not present

## 2014-10-02 DIAGNOSIS — Z716 Tobacco abuse counseling: Secondary | ICD-10-CM | POA: Diagnosis not present

## 2014-10-02 DIAGNOSIS — Z90710 Acquired absence of both cervix and uterus: Secondary | ICD-10-CM | POA: Diagnosis not present

## 2014-10-02 DIAGNOSIS — Z9851 Tubal ligation status: Secondary | ICD-10-CM | POA: Diagnosis not present

## 2014-10-02 DIAGNOSIS — F1410 Cocaine abuse, uncomplicated: Secondary | ICD-10-CM | POA: Diagnosis not present

## 2014-10-02 DIAGNOSIS — Z8701 Personal history of pneumonia (recurrent): Secondary | ICD-10-CM | POA: Insufficient documentation

## 2014-10-02 DIAGNOSIS — R05 Cough: Secondary | ICD-10-CM

## 2014-10-02 DIAGNOSIS — J45901 Unspecified asthma with (acute) exacerbation: Secondary | ICD-10-CM | POA: Diagnosis not present

## 2014-10-02 DIAGNOSIS — R059 Cough, unspecified: Secondary | ICD-10-CM | POA: Insufficient documentation

## 2014-10-02 DIAGNOSIS — N289 Disorder of kidney and ureter, unspecified: Secondary | ICD-10-CM | POA: Insufficient documentation

## 2014-10-02 DIAGNOSIS — Z87891 Personal history of nicotine dependence: Secondary | ICD-10-CM | POA: Diagnosis not present

## 2014-10-02 DIAGNOSIS — Z7952 Long term (current) use of systemic steroids: Secondary | ICD-10-CM | POA: Diagnosis not present

## 2014-10-02 DIAGNOSIS — R0789 Other chest pain: Secondary | ICD-10-CM | POA: Insufficient documentation

## 2014-10-02 DIAGNOSIS — G43909 Migraine, unspecified, not intractable, without status migrainosus: Secondary | ICD-10-CM | POA: Diagnosis not present

## 2014-10-02 DIAGNOSIS — R0600 Dyspnea, unspecified: Secondary | ICD-10-CM | POA: Diagnosis present

## 2014-10-02 LAB — COMPREHENSIVE METABOLIC PANEL
ALK PHOS: 67 U/L (ref 39–117)
ALT: 20 U/L (ref 0–35)
AST: 26 U/L (ref 0–37)
Albumin: 3.7 g/dL (ref 3.5–5.2)
Anion gap: 13 (ref 5–15)
BUN: 18 mg/dL (ref 6–23)
CO2: 23 mmol/L (ref 19–32)
CREATININE: 1.93 mg/dL — AB (ref 0.50–1.10)
Calcium: 8.8 mg/dL (ref 8.4–10.5)
Chloride: 96 mmol/L (ref 96–112)
GFR calc non Af Amer: 28 mL/min — ABNORMAL LOW (ref >90)
GFR, EST AFRICAN AMERICAN: 32 mL/min — AB (ref >90)
GLUCOSE: 110 mg/dL — AB (ref 70–99)
Potassium: 3.6 mmol/L (ref 3.5–5.1)
Sodium: 132 mmol/L — ABNORMAL LOW (ref 135–145)
Total Bilirubin: 0.3 mg/dL (ref 0.3–1.2)
Total Protein: 8.1 g/dL (ref 6.0–8.3)

## 2014-10-02 LAB — I-STAT TROPONIN, ED: Troponin i, poc: 0 ng/mL (ref 0.00–0.08)

## 2014-10-02 LAB — BRAIN NATRIURETIC PEPTIDE: B Natriuretic Peptide: 8.6 pg/mL (ref 0.0–100.0)

## 2014-10-02 LAB — CBC
HEMATOCRIT: 38 % (ref 36.0–46.0)
HEMOGLOBIN: 11.9 g/dL — AB (ref 12.0–15.0)
MCH: 24 pg — ABNORMAL LOW (ref 26.0–34.0)
MCHC: 31.3 g/dL (ref 30.0–36.0)
MCV: 76.8 fL — ABNORMAL LOW (ref 78.0–100.0)
Platelets: 296 10*3/uL (ref 150–400)
RBC: 4.95 MIL/uL (ref 3.87–5.11)
RDW: 17.4 % — ABNORMAL HIGH (ref 11.5–15.5)
WBC: 7.9 10*3/uL (ref 4.0–10.5)

## 2014-10-02 MED ORDER — IPRATROPIUM BROMIDE 0.02 % IN SOLN
0.5000 mg | Freq: Once | RESPIRATORY_TRACT | Status: AC
  Administered 2014-10-02: 0.5 mg via RESPIRATORY_TRACT
  Filled 2014-10-02: qty 2.5

## 2014-10-02 MED ORDER — SODIUM CHLORIDE 0.9 % IV SOLN
INTRAVENOUS | Status: DC
  Administered 2014-10-02: 18:00:00 via INTRAVENOUS

## 2014-10-02 MED ORDER — ACETAMINOPHEN 325 MG PO TABS
650.0000 mg | ORAL_TABLET | Freq: Four times a day (QID) | ORAL | Status: DC | PRN
  Administered 2014-10-03: 650 mg via ORAL
  Filled 2014-10-02: qty 2

## 2014-10-02 MED ORDER — SODIUM CHLORIDE 0.9 % IV BOLUS (SEPSIS)
1000.0000 mL | Freq: Once | INTRAVENOUS | Status: AC
  Administered 2014-10-02: 1000 mL via INTRAVENOUS

## 2014-10-02 MED ORDER — MORPHINE SULFATE 4 MG/ML IJ SOLN
6.0000 mg | Freq: Once | INTRAMUSCULAR | Status: AC
  Administered 2014-10-02: 6 mg via INTRAVENOUS
  Filled 2014-10-02: qty 2

## 2014-10-02 MED ORDER — ALBUTEROL SULFATE (2.5 MG/3ML) 0.083% IN NEBU: 2.5000 mg | INHALATION_SOLUTION | RESPIRATORY_TRACT | Status: DC

## 2014-10-02 MED ORDER — IPRATROPIUM BROMIDE 0.02 % IN SOLN: 0.5000 mg | RESPIRATORY_TRACT | Status: DC

## 2014-10-02 MED ORDER — SODIUM CHLORIDE 0.9 % IV SOLN: INTRAVENOUS | Status: DC

## 2014-10-02 MED ORDER — MAGNESIUM SULFATE 2 GM/50ML IV SOLN
2.0000 g | Freq: Once | INTRAVENOUS | Status: AC
  Administered 2014-10-02: 2 g via INTRAVENOUS
  Filled 2014-10-02: qty 50

## 2014-10-02 MED ORDER — ACETAMINOPHEN 650 MG RE SUPP: 650.0000 mg | Freq: Four times a day (QID) | RECTAL | Status: DC | PRN

## 2014-10-02 MED ORDER — ALBUTEROL SULFATE (2.5 MG/3ML) 0.083% IN NEBU
5.0000 mg | INHALATION_SOLUTION | Freq: Once | RESPIRATORY_TRACT | Status: AC
  Administered 2014-10-02: 5 mg via RESPIRATORY_TRACT
  Filled 2014-10-02: qty 6

## 2014-10-02 MED ORDER — IPRATROPIUM-ALBUTEROL 0.5-2.5 (3) MG/3ML IN SOLN
3.0000 mL | Freq: Four times a day (QID) | RESPIRATORY_TRACT | Status: DC
  Administered 2014-10-03 (×2): 3 mL via RESPIRATORY_TRACT
  Filled 2014-10-02 (×2): qty 3

## 2014-10-02 MED ORDER — HEPARIN SODIUM (PORCINE) 5000 UNIT/ML IJ SOLN
5000.0000 [IU] | Freq: Three times a day (TID) | INTRAMUSCULAR | Status: DC
  Administered 2014-10-02 – 2014-10-03 (×3): 5000 [IU] via SUBCUTANEOUS
  Filled 2014-10-02 (×3): qty 1

## 2014-10-02 MED ORDER — ALBUTEROL SULFATE (2.5 MG/3ML) 0.083% IN NEBU
2.5000 mg | INHALATION_SOLUTION | RESPIRATORY_TRACT | Status: DC | PRN
  Administered 2014-10-03: 2.5 mg via RESPIRATORY_TRACT
  Filled 2014-10-02: qty 3

## 2014-10-02 MED ORDER — ONDANSETRON HCL 4 MG/2ML IJ SOLN
4.0000 mg | Freq: Once | INTRAMUSCULAR | Status: AC
  Administered 2014-10-02: 4 mg via INTRAVENOUS
  Filled 2014-10-02: qty 2

## 2014-10-02 MED ORDER — BUDESONIDE-FORMOTEROL FUMARATE 160-4.5 MCG/ACT IN AERO
2.0000 | INHALATION_SPRAY | Freq: Two times a day (BID) | RESPIRATORY_TRACT | Status: DC
  Administered 2014-10-02 – 2014-10-03 (×2): 2 via RESPIRATORY_TRACT
  Filled 2014-10-02: qty 6

## 2014-10-02 MED ORDER — METHYLPREDNISOLONE SODIUM SUCC 125 MG IJ SOLR
125.0000 mg | Freq: Once | INTRAMUSCULAR | Status: AC
  Administered 2014-10-02: 125 mg via INTRAVENOUS
  Filled 2014-10-02: qty 2

## 2014-10-02 MED ORDER — SODIUM CHLORIDE 0.9 % IJ SOLN
3.0000 mL | Freq: Two times a day (BID) | INTRAMUSCULAR | Status: DC
  Administered 2014-10-02 – 2014-10-03 (×2): 3 mL via INTRAVENOUS

## 2014-10-02 MED ORDER — MONTELUKAST SODIUM 10 MG PO TABS
10.0000 mg | ORAL_TABLET | Freq: Every day | ORAL | Status: DC
  Administered 2014-10-02 – 2014-10-03 (×2): 10 mg via ORAL
  Filled 2014-10-02 (×2): qty 1

## 2014-10-02 MED ORDER — IPRATROPIUM-ALBUTEROL 0.5-2.5 (3) MG/3ML IN SOLN
3.0000 mL | RESPIRATORY_TRACT | Status: DC
  Administered 2014-10-02: 3 mL via RESPIRATORY_TRACT
  Filled 2014-10-02 (×2): qty 3

## 2014-10-02 MED ORDER — ASPIRIN 81 MG PO CHEW
81.0000 mg | CHEWABLE_TABLET | Freq: Every day | ORAL | Status: DC
  Administered 2014-10-02 – 2014-10-03 (×2): 81 mg via ORAL
  Filled 2014-10-02 (×2): qty 1

## 2014-10-02 NOTE — H&P (Signed)
[facility] History and Physical Service Pager: [phone]  Patient name: Vanessa Wallace Medical record number: [id number] Date of birth: May 01, 1956 Age: 59 y.o. Gender: female  Primary Care Provider: PROVIDER NOT IN SYSTEM Consultants: none Code Status: Full  Chief Complaint: wheezing  Assessment and Plan: Vanessa Wallace is a 59 y.o. female presenting with asthma exacerbation. PMH is significant for asthma, hypertension, tobacco use, cocaine abuse.   # Asthma exacerbation: failed outpatient treatment x 1 month. In ED continued to require oxygen with desats with ambulation. CXR with no evidence of pneumonia and lab work otherwise okay except for AKI. Pt reports being on prednisone dose pack (on day 2 or 3), also says she was on 2 antibiotics one of which was likely azithromycin. On exam has significant wheezes despite multiple breathing treatments in the ED but does have increased work of breathing, O2 saturation in upper 90s while on 4L O2 and only drops to low 90s briefly with oxygen turned off in bed.  - admit to observation - continue duonebs q4hrs, q2hrs PRN - continue home symbicort (also has qvar at home, will hold formulary equivalent as symbicort has an inhaler steroid as well) - continue solumedrol 125mcg - continue oxygen supplementation  # AKI: Cr 1.93 up from 0.84 1 year ago. Likely 2/2 dehydration. - continue NS at 125cc/hr - recheck bmet tomorrow  # Tobacco abuse: - smoking cessation counseling given in ED  FEN/GI: reg diet, NS 125cc/hr Prophylaxis: heparin sq  Disposition: observation  History of Present Illness: Vanessa Wallace is a 59 y.o. female presenting with shortness of breath x 1 month. She has been going to her doctor's office and getting a "shot" and feeling better until the next day when she starts wheezing again. States she is using inhalers 2 times in morning, 2 times at night (using "all of them"). She has had  night time awakenings coughing/choking. Last cigarette was 3 weeks ago, before that was only smoking 1/2 cigarette a day because it caused her issues with breathing. Sputum productive clear white mucous. She says she was given 2 antibiotics at the PCP office, one she took for 3 days and the other for 6, finished 6 day medicine a few days ago; couldn't remember names of medicine but describes pink oval pill (?azithromycin) and says doxycycline sounds familiar. Has been very thirsty over the past few days, drank multiple gatorades. States urine has been more clear than yellow. She feels short of breath currently with some lightheadedness, also having some pain below her left breast in her ribs only when she coughs. She has not felt any fevers, no vomiting, has had diarrhea (had very light green loose stools this morning).  Review Of Systems: Per HPI with the following additions: none Otherwise 12 point review of systems was performed and was unremarkable.  Patient Active Problem List   Diagnosis Date Noted  . Asthma exacerbation 10/02/2014  . Renal insufficiency   . Chest wall pain   . Cough   . Cocaine use 09/22/2013  . Pneumonia 09/17/2013  . Community acquired pneumonia 09/17/2013   Past Medical History: Past Medical History  Diagnosis Date  . Asthma   . Hypertension   . Chronic bronchitis     "flares up w/my asthma" (09/17/2013)  . CAP (community acquired pneumonia) 09/17/2013  . Migraines     "weekly lately" (09/17/2013)  . Arthritis     "all over" (09/17/2013)   Past Surgical History: Past Surgical  History  Procedure Laterality Date  . Tubal ligation    . Laceration repair Left 06/1999    Repair FPL, radial digital nerve, intrinsics, left thumb./notes 06/29/1999 (09/17/2013)  . Bunionectomy Bilateral     "right one came back after OR" (09/17/2013)  . Abdominal hysterectomy      "partial"   Social History: History  Substance Use Topics  . Smoking status: Former Smoker -- 0.30  packs/day for 40 years    Types: Cigarettes    Quit date: 09/18/2014  . Smokeless tobacco: Never Used     Comment: 09/17/2013 "weaned down from 2 ppd of cigarettes"  . Alcohol Use: No     Comment: Quit 2 weeks   Additional social history: none  Please also refer to relevant sections of EMR.  Family History: History reviewed. No pertinent family history. Allergies and Medications: No Known Allergies No current facility-administered medications on file prior to encounter.   Current Outpatient Prescriptions on File Prior to Encounter  Medication Sig Dispense Refill  . albuterol (PROVENTIL HFA;VENTOLIN HFA) 108 (90 BASE) MCG/ACT inhaler Inhale into the lungs every 6 (six) hours as needed for wheezing or shortness of breath. ProAir    . budesonide-formoterol (SYMBICORT) 160-4.5 MCG/ACT inhaler Inhale 2 puffs into the lungs 2 (two) times daily.    Marland Kitchen guaiFENesin (MUCINEX) 600 MG 12 hr tablet Take 1 tablet (600 mg total) by mouth 2 (two) times daily. 60 tablet 0  . lisinopril-hydrochlorothiazide (PRINZIDE,ZESTORETIC) 20-25 MG per tablet Take 1 tablet by mouth daily.    . predniSONE (DELTASONE) 10 MG tablet Take 4 tabs ( ) daily for 2 days, then 3 tablets (  daily) for 2 days, then 2 tabs ( ) daily for 2 days, then 1 tabs for 2 days 20 tablet 0  . albuterol (ACCUNEB) 0.63 MG/3ML nebulizer solution Take 3 mLs (0.63 mg total) by nebulization every 6 (six) hours as needed for wheezing or shortness of breath. (Patient not taking: Reported on 10/02/2014) 75 mL 12  . levofloxacin (LEVAQUIN) 750 MG tablet Take 1 tablet (750 mg total) by mouth daily. (Patient not taking: Reported on 10/02/2014) 3 tablet 0    Objective: BP 102/49 mmHg  Pulse 93  Temp(Src) 99.1 F (37.3 C) (Oral)  Resp 19  Ht  (1.575 m)  Wt 145 lb (65.772 kg)  BMI 26.51 kg/m2  SpO2 100% Exam: General: NAD, sitting in bed trying to open saltine crackers and asking for juice HEENT: PERRL, EOMI. Arcus senilis  bilaterally Cardiovascular: RRR, normal heart sounds, no murmurs. 2+ radial and PT pulses bilaterally Respiratory: CTAB, normal effort Abdomen: soft, nontender, nondistended, normal bowel sounds Extremities: no edema or cyanosis Skin: no rashes appreciated Neuro: alert and oriented, no focal deficits  Labs and Imaging: CBC BMET   Recent Labs Lab 10/02/14 1312  WBC 7.9  HGB 11.9*  HCT 38.0  PLT 296    Recent Labs Lab 10/02/14 1312  NA 132*  K 3.6  CL 96  CO2 23  BUN 18  CREATININE 1.93*  GLUCOSE 110*  CALCIUM 8.8     Dg Chest 2 View  10/02/2014   CLINICAL DATA:  Cough, respiratory distress, chest pain  EXAM: CHEST  2 VIEW  COMPARISON:  09/17/2013  FINDINGS: Chronic interstitial markings. No focal consolidation. No pleural effusion or pneumothorax.  Nodular opacities overlying the bilateral lower lungs correspond to nipple shadows.  The heart is normal in size.  Visualized osseous structures are within normal limits.  IMPRESSION: No evidence of acute cardiopulmonary  disease.   Electronically Signed   By: Charline Bills M.D.   On: 10/02/2014 13:52    Kathee Delton, MD 10/02/2014, 6:05 PM PGY-1, [facility] FPTS Intern pager: [phone], text pages welcome  I have seen and examined the patient. I have read and agree with the above note. My changes are noted in blue.  Tawni Carnes, MD 10/02/2014, 8:42 PM PGY-2, [facility] FPTS Intern Pager: [phone], text pages welcome

## 2014-10-02 NOTE — ED Notes (Signed)
[facility]- Coming from home, resp distress intermittently X2 weeks. Initial room air sat of 97%, expiratory wheezing all fields, worse on the right. Generalized body aches also reported. 10mg  albuterol and 1mg  of Atrovent. Productive cough with clear sputum. 98/58, HR 112bpm reporting chest tightness and sharp pain with coughing.

## 2014-10-02 NOTE — ED Notes (Signed)
O2 sats decreased to low 80's when D/c'd from O2.

## 2014-10-02 NOTE — ED Provider Notes (Addendum)
CSN: [id number]     Arrival date & time 10/02/14  1257 History   First MD Initiated Contact with Patient 10/02/14 1304     Chief Complaint  Patient presents with  . Respiratory Distress     (Consider location/radiation/quality/duration/timing/severity/associated sxs/prior Treatment) Patient is a 59 y.o. female presenting with shortness of breath. The history is provided by the patient.  Shortness of Breath Severity:  Severe Onset quality:  Gradual Duration:  2 weeks Timing:  Constant Progression:  Worsening Chronicity:  Recurrent Context: pollens   Relieved by:  Nothing Worsened by:  Activity Ineffective treatments:  Inhaler (prednisone) Associated symptoms: chest pain, cough, sputum production and wheezing   Associated symptoms: no fever and no vomiting   Associated symptoms comment:  Rib pain and back pain from all the coughing.  Yellow sputum production Risk factors: tobacco use   Risk factors: no hx of PE/DVT, no prolonged immobilization and no recent surgery     Past Medical History  Diagnosis Date  . Asthma   . Hypertension   . Chronic bronchitis     "flares up w/my asthma" (09/17/2013)  . CAP (community acquired pneumonia) 09/17/2013  . Migraines     "weekly lately" (09/17/2013)  . Arthritis     "all over" (09/17/2013)   Past Surgical History  Procedure Laterality Date  . Tubal ligation    . Laceration repair Left 06/1999    Repair FPL, radial digital nerve, intrinsics, left thumb./notes 06/29/1999 (09/17/2013)  . Bunionectomy Bilateral     "right one came back after OR" (09/17/2013)  . Abdominal hysterectomy      "partial"   History reviewed. No pertinent family history. History  Substance Use Topics  . Smoking status: Former Smoker -- 0.30 packs/day for 40 years    Types: Cigarettes    Quit date: 09/18/2014  . Smokeless tobacco: Never Used     Comment: 09/17/2013 "weaned down from 2 ppd of cigarettes"  . Alcohol Use: No     Comment: Quit 2 weeks   OB History     No data available     Review of Systems  Constitutional: Negative for fever.  Respiratory: Positive for cough, sputum production, shortness of breath and wheezing.   Cardiovascular: Positive for chest pain.  Gastrointestinal: Negative for vomiting.  All other systems reviewed and are negative.     Allergies  Review of patient's allergies indicates no known allergies.  Home Medications   Prior to Admission medications   Medication Sig Start Date End Date Taking? Authorizing Provider  albuterol (ACCUNEB) 0.63 MG/3ML nebulizer solution Take 3 mLs (0.63 mg total) by nebulization every 6 (six) hours as needed for wheezing or shortness of breath. 09/19/13   Ky BarbanSolianny D Kennerly, MD  albuterol (PROVENTIL HFA;VENTOLIN HFA) 108 (90 BASE) MCG/ACT inhaler Inhale into the lungs every 6 (six) hours as needed for wheezing or shortness of breath (wheezing).    Historical Provider, MD  budesonide-formoterol (SYMBICORT) 160-4.5 MCG/ACT inhaler Inhale 2 puffs into the lungs 2 (two) times daily.    Historical Provider, MD  guaiFENesin (MUCINEX) 600 MG 12 hr tablet Take 1 tablet (600 mg total) by mouth 2 (two) times daily. 09/19/13   Ky BarbanSolianny D Kennerly, MD  levofloxacin (LEVAQUIN) 750 MG tablet Take 1 tablet (750 mg total) by mouth daily. 09/19/13   Ky BarbanSolianny D Kennerly, MD  lisinopril-hydrochlorothiazide (PRINZIDE,ZESTORETIC) 20-25 MG per tablet Take 1 tablet by mouth daily.    Historical Provider, MD  predniSONE (DELTASONE) 10 MG tablet Take 4  tabs ( ) daily for 2 days, then 3 tablets (  daily) for 2 days, then 2 tabs ( ) daily for 2 days, then 1 tabs for 2 days 09/19/13   Ky Barban, MD   There were no vitals taken for this visit. Physical Exam  Constitutional: She is oriented to person, place, and time. She appears well-developed and well-nourished. She appears distressed.  HENT:  Head: Normocephalic and atraumatic.  Eyes: EOM are normal. Pupils are equal, round, and reactive to light.   Cardiovascular: Regular rhythm, normal heart sounds and intact distal pulses.  Tachycardia present.  Exam reveals no friction rub.   No murmur heard. Pulmonary/Chest: Effort normal. She has wheezes. She has no rales. She exhibits tenderness.  Abdominal: Soft. Bowel sounds are normal. She exhibits no distension. There is no tenderness. There is no rebound and no guarding.  Musculoskeletal: Normal range of motion. She exhibits no tenderness.  No edema.  No calf pain or swelling  Neurological: She is alert and oriented to person, place, and time. No cranial nerve deficit.  Skin: Skin is warm and dry. No rash noted.  Psychiatric: She has a normal mood and affect. Her behavior is normal.  Nursing note and vitals reviewed.   ED Course  Procedures (including critical care time) Labs Review Labs Reviewed  CBC - Abnormal; Notable for the following:    Hemoglobin 11.9 (*)    MCV 76.8 (*)    MCH 24.0 (*)    RDW 17.4 (*)    All other components within normal limits  COMPREHENSIVE METABOLIC PANEL - Abnormal; Notable for the following:    Sodium 132 (*)    Glucose, Bld 110 (*)    Creatinine, Ser 1.93 (*)    GFR calc non Af Amer 28 (*)    GFR calc Af Amer 32 (*)    All other components within normal limits  BRAIN NATRIURETIC PEPTIDE  I-STAT TROPOININ, ED    Imaging Review Dg Chest 2 View  10/02/2014   CLINICAL DATA:  Cough, respiratory distress, chest pain  EXAM: CHEST  2 VIEW  COMPARISON:  09/17/2013  FINDINGS: Chronic interstitial markings. No focal consolidation. No pleural effusion or pneumothorax.  Nodular opacities overlying the bilateral lower lungs correspond to nipple shadows.  The heart is normal in size.  Visualized osseous structures are within normal limits.  IMPRESSION: No evidence of acute cardiopulmonary disease.   Electronically Signed   By: Charline Bills M.D.   On: 10/02/2014 13:52    ED ECG REPORT   Date: 10/02/2014  Rate: 125  Rhythm: sinus tachycardia  QRS Axis:  normal  Intervals: normal  ST/T Wave abnormalities: nonspecific ST/T changes  Conduction Disutrbances:lvh  Narrative Interpretation:   Old EKG Reviewed: unchanged  I have personally reviewed the EKG tracing and agree with the computerized printout as noted.   MDM   Final diagnoses:  Asthma exacerbation  Renal insufficiency    Patient with a history of asthma, hypertension and drug use presents today with worsening wheezing and shortness of breath. Patient states for the last 2 weeks she's been seeing her doctor without improvement in her symptoms despite using the new Qvar inhaler and using prednisone. Over the last 2 days her breathing has gotten much worse. She called EMS today due to respiratory distress. She had diffuse wheezing and was given albuterol and Atrovent on the way here. Patient is using accessory muscles with minimal wheezing and decreased breath sounds bilaterally. She is able to talk in short  sentences.  She denies fever but does have a productive cough. Feel this is most likely asthma exacerbation related to allergies. Patient given albuterol and Atrovent prior to arrival. Will repeat and in addition will give prednisone and magnesium. Chest x-ray, CBC, CMP, BMP, troponin pending. EKG shows sinus tachycardia without significant changes.  3:17 PM Labs and CXR without acute findings.  Pt still desating on RA to 86% and requiring O2.  Pt given another neb treatment.  Will admit for further care.  CMP also shows new renal insufficiency of 1.9.  Pt also given IVF.  Gwyneth Sprout, MD 10/02/14 1529  Gwyneth Sprout, MD 10/02/14 1540  Gwyneth Sprout, MD 10/02/14 1541

## 2014-10-03 ENCOUNTER — Other Ambulatory Visit: Payer: Self-pay

## 2014-10-03 DIAGNOSIS — I10 Essential (primary) hypertension: Secondary | ICD-10-CM | POA: Diagnosis not present

## 2014-10-03 DIAGNOSIS — N179 Acute kidney failure, unspecified: Secondary | ICD-10-CM | POA: Diagnosis not present

## 2014-10-03 DIAGNOSIS — J45901 Unspecified asthma with (acute) exacerbation: Secondary | ICD-10-CM | POA: Diagnosis not present

## 2014-10-03 DIAGNOSIS — G43909 Migraine, unspecified, not intractable, without status migrainosus: Secondary | ICD-10-CM | POA: Diagnosis not present

## 2014-10-03 LAB — RAPID URINE DRUG SCREEN, HOSP PERFORMED
Amphetamines: NOT DETECTED
Barbiturates: NOT DETECTED
Benzodiazepines: NOT DETECTED
COCAINE: POSITIVE — AB
Opiates: POSITIVE — AB
TETRAHYDROCANNABINOL: NOT DETECTED

## 2014-10-03 LAB — CBC
HCT: 29.6 % — ABNORMAL LOW (ref 36.0–46.0)
Hemoglobin: 9.4 g/dL — ABNORMAL LOW (ref 12.0–15.0)
MCH: 24.4 pg — AB (ref 26.0–34.0)
MCHC: 31.8 g/dL (ref 30.0–36.0)
MCV: 76.7 fL — AB (ref 78.0–100.0)
Platelets: 224 10*3/uL (ref 150–400)
RBC: 3.86 MIL/uL — ABNORMAL LOW (ref 3.87–5.11)
RDW: 17.8 % — ABNORMAL HIGH (ref 11.5–15.5)
WBC: 6.2 10*3/uL (ref 4.0–10.5)

## 2014-10-03 LAB — GLUCOSE, CAPILLARY: Glucose-Capillary: 105 mg/dL — ABNORMAL HIGH (ref 70–99)

## 2014-10-03 LAB — BASIC METABOLIC PANEL
Anion gap: 10 (ref 5–15)
BUN: 21 mg/dL (ref 6–23)
CO2: 20 mmol/L (ref 19–32)
Calcium: 7.8 mg/dL — ABNORMAL LOW (ref 8.4–10.5)
Chloride: 106 mmol/L (ref 96–112)
Creatinine, Ser: 1.33 mg/dL — ABNORMAL HIGH (ref 0.50–1.10)
GFR, EST AFRICAN AMERICAN: 50 mL/min — AB (ref >90)
GFR, EST NON AFRICAN AMERICAN: 43 mL/min — AB (ref >90)
GLUCOSE: 135 mg/dL — AB (ref 70–99)
POTASSIUM: 4.4 mmol/L (ref 3.5–5.1)
SODIUM: 136 mmol/L (ref 135–145)

## 2014-10-03 LAB — D-DIMER, QUANTITATIVE (NOT AT ARMC)

## 2014-10-03 MED ORDER — PREDNISONE 50 MG PO TABS: 50.0000 mg | ORAL_TABLET | Freq: Every day | ORAL | Status: DC

## 2014-10-03 MED ORDER — PNEUMOCOCCAL VAC POLYVALENT 25 MCG/0.5ML IJ INJ: 0.5000 mL | INJECTION | INTRAMUSCULAR | Status: DC

## 2014-10-03 MED ORDER — INFLUENZA VAC SPLIT QUAD 0.5 ML IM SUSY: 0.5000 mL | PREFILLED_SYRINGE | INTRAMUSCULAR | Status: DC

## 2014-10-03 MED ORDER — GUAIFENESIN-DM 100-10 MG/5ML PO SYRP
5.0000 mL | ORAL_SOLUTION | ORAL | Status: DC | PRN
  Administered 2014-10-03: 5 mL via ORAL
  Filled 2014-10-03 (×2): qty 5

## 2014-10-03 MED ORDER — PREDNISONE 20 MG PO TABS
50.0000 mg | ORAL_TABLET | Freq: Every day | ORAL | Status: DC
  Filled 2014-10-03: qty 1

## 2014-10-03 NOTE — Progress Notes (Signed)
Called by RN to give pt breathing treatment. Patient woke up and started coughing and producing sputum and became SOB. BBS Expiratory Wheezing on 3 LPM [place] SPO2 99%.

## 2014-10-03 NOTE — Progress Notes (Signed)
Family Medicine Teaching Service Daily Progress Note Intern Pager: [phone]  Patient name: Vanessa Wallace Medical record number: [id number] Date of birth: 03/01/1956 Age: 59 y.o. Gender: female  Primary Care Provider: PROVIDER NOT IN SYSTEM Consultants: none Code Status: Full  Pt Overview and Major Events to Date:  3/19: admitted  Assessment and Plan: Vanessa FordVivian A Dolph is a 59 y.o. female presenting with asthma exacerbation. PMH is significant for asthma, hypertension, tobacco use, cocaine abuse.   # Asthma exacerbation: failed outpatient treatment x 1 month. In ED continued to require oxygen with desats with ambulation. CXR with no evidence of pneumonia and lab work otherwise okay except for AKI. Pt reports being on prednisone dose pack (on day 2 or 3), also says she was on 2 antibiotics one of which was likely azithromycin. On exam has significant wheezes despite multiple breathing treatments in the ED but does have increased work of breathing, O2 saturation in upper 90s while on 4L O2 and only drops to low 90s briefly with oxygen turned off in bed.  - continue duonebs q4hrs, q2hrs PRN - continue home symbicort (also has qvar at home, will hold formulary equivalent as symbicort has an inhaler steroid as well) - transition to oral prednisone 50mg  - continue oxygen supplementation  # AKI: Cr 1.93 up from 0.84 1 year ago. Likely 2/2 dehydration. - pending BMET  # Tobacco abuse: - smoking cessation counseling given in ED  FEN/GI: reg diet, NS 125cc/hr Prophylaxis: heparin sq  Disposition: pending clinical improvement   Subjective:  Feels better this morning, jsut had a breathing treatment. Cough is worse when she is awake and productive of white sputum. CP similar, not SOB currently.  Objective: Temp:  [97.7 F (36.5 C)-100.4 F (38 C)] 97.7 F (36.5 C) (03/20 0430) Pulse Rate:  [62-109] 92 (03/20 0430) Resp:  [11-38] 20 (03/20 0430) BP: (90-118)/(49-70) 118/55 mmHg (03/20  0430) SpO2:  [90 %-100 %] 97 % (03/20 0430) Weight:  [145 lb (65.772 kg)] 145 lb (65.772 kg) (03/19 1610) Physical Exam: General: NAD Cardiovascular: rrr, no mrg Respiratory: diffuse wheezes bilaterally, normal effort. Cough present. Abdomen: soft, ntnd, normal BS Extremities: no edema or cyanosis Neuro: alert and oriented, no focal deficits  Laboratory:  Recent Labs Lab 10/02/14 1312  WBC 7.9  HGB 11.9*  HCT 38.0  PLT 296    Recent Labs Lab 10/02/14 1312  NA 132*  K 3.6  CL 96  CO2 23  BUN 18  CREATININE 1.93*  CALCIUM 8.8  PROT 8.1  BILITOT 0.3  ALKPHOS 67  ALT 20  AST 26  GLUCOSE 110*    Imaging/Diagnostic Tests: None new  Nani RavensAndrew M Kaled Allende, MD 10/03/2014, 6:36 AM PGY-2, [facility] FPTS Intern pager: [phone], text pages welcome

## 2014-10-03 NOTE — Discharge Instructions (Signed)
It is very important to not miss any of your doses of medications: When you leave the hospital do your albuterol treatments every 4 hours for the first day, then every 4 hours as needed Continue taking your singulair, you may also try adding an allergy medicine like zyrtec or allegra (can be purchased over the counter) Continue your symbicort inhaler... If you also have the QVAR inhaler you do not need to take it WHILE YOU ARE TAKING THE SYMBICORT because the symbicort inhaler contains a similar medication as the qvar.

## 2014-10-03 NOTE — Progress Notes (Signed)
Discharge paperwork given to patient, and signed by patient. IV removed. Will leave during next shift. Patient waiting for ride. Will report to oncoming nurse.

## 2014-10-03 NOTE — Discharge Summary (Signed)
[facility]  Patient name: Vanessa Wallace Medical record number: [id number] Date of birth: 1955-08-20 Age: 59 y.o. Gender: female Date of Admission: 10/02/2014  Date of Discharge: 10/03/14 Admitting Physician: Barbaraann BarthelJames O Breen, MD  Primary Care Provider: PROVIDER NOT IN SYSTEM Consultants: none  Indication for Hospitalization: Asthma exacerbation  Discharge Diagnoses/Problem List:  Asthma exacerbation AKI Tobacco abuse Substance Abuse, cocaine  Disposition: Home  Discharge Condition: Stable  Brief Hospital Course:  Pt is a 59 y/o female presenting w/ SOB x91mth. States she is using inhalers 2 times in morning, 2 times at night (using "all of them"). She has had night time awakenings coughing/choking. Last cigarette was 3 weeks ago, before that was only smoking 1/2 cigarette a day because it caused her issues with breathing. Sputum productive clear white mucous. She says she was given 2 antibiotics at the PCP office, one she took for 3 days and the other for 6, finished 6 day medicine a few days ago; couldn't remember names of medicine but describes pink oval pill (?azithromycin) and says doxycycline sounds familiar. Has been very thirsty over the past few days, drank multiple gatorades. States urine has been more clear than yellow.   Patient felt short of breath on admission with some lightheadedness, also having some pain below her left breast in her ribs only when she coughs. She has not felt any fevers, no vomiting, has had diarrhea (had very light green loose stools).  Patient was admitted into observation. She was placed on duonebs q4hrs scheduled and q2hrs PRN. Home symbicort was continued -- home QVar was held (due to redundant activity). Pt given solumedrol on ED and supplemental O2 was provided throughout stay.   Patient was noted to have an active AKI on admission. BL Cr is ~0.8-0.9, on admission Cr 1.93. She was given IVF during her  stay at 125cc/hr. This downtrended to 1.33 the day of DC. It was stressed to patient that she needs to stay adequately hydrated as best as possible.   UDS was obtained on 3/20. This was positive for Opiates and Cocaine. Cocaine use may be a likely culprit to her asthma exacerbation.   Patient was deemed medically stable for DC on 3/20. She was provided prescriptions for PO Levaquin and Prednisone on DC. She was not endorsing any continued SOB at that time, and she was released from our care.  Issues for Follow Up:  1. Medication compliance. 2. Patient on both QVar and Symbicort -- we held Qvar on DC due to redundant activity 3. UDS positive for Cocaine -- education on substance abuse and the systemic effects it has on things like respiratory function.   Significant Procedures: none  Significant Labs and Imaging:   Recent Labs Lab 10/02/14 1312 10/03/14 0607  WBC 7.9 6.2  HGB 11.9* 9.4*  HCT 38.0 29.6*  PLT 296 224    Recent Labs Lab 10/02/14 1312 10/03/14 0607  NA 132* 136  K 3.6 4.4  CL 96 106  CO2 23 20  GLUCOSE 110* 135*  BUN 18 21  CREATININE 1.93* 1.33*  CALCIUM 8.8 7.8*  ALKPHOS 67  --   AST 26  --   ALT 20  --   ALBUMIN 3.7  --     UDS: positive for opiates and cocaine  Results/Tests Pending at Time of Discharge: none  Discharge Medications:    Medication List    STOP taking these medications        beclomethasone  40 MCG/ACT inhaler  Commonly known as:  QVAR      TAKE these medications        albuterol (2.5 MG/3ML) 0.083% nebulizer solution  Commonly known as:  PROVENTIL  Take 2.5 mg by nebulization every 4 (four) hours as needed for wheezing or shortness of breath.     albuterol 0.63 MG/3ML nebulizer solution  Commonly known as:  ACCUNEB  Take 3 mLs (0.63 mg total) by nebulization every 6 (six) hours as needed for wheezing or shortness of breath.     albuterol 108 (90 BASE) MCG/ACT inhaler  Commonly known as:  PROVENTIL HFA;VENTOLIN HFA   Inhale into the lungs every 6 (six) hours as needed for wheezing or shortness of breath. ProAir     aspirin 81 MG chewable tablet  Chew 81 mg by mouth daily.     budesonide-formoterol 160-4.5 MCG/ACT inhaler  Commonly known as:  SYMBICORT  Inhale 2 puffs into the lungs 2 (two) times daily.     guaiFENesin 600 MG 12 hr tablet  Commonly known as:  MUCINEX  Take 1 tablet (600 mg total) by mouth 2 (two) times daily.     levofloxacin 750 MG tablet  Commonly known as:  LEVAQUIN  Take 1 tablet (750 mg total) by mouth daily.     lisinopril-hydrochlorothiazide 20-25 MG per tablet  Commonly known as:  PRINZIDE,ZESTORETIC  Take 1 tablet by mouth daily.     montelukast 10 MG tablet  Commonly known as:  SINGULAIR  Take 10 mg by mouth daily.     predniSONE 50 MG tablet  Commonly known as:  DELTASONE  Take 1 tablet (50 mg total) by mouth daily with breakfast.        Discharge Instructions: Please refer to Patient Instructions section of EMR for full details.  Patient was counseled important signs and symptoms that should prompt return to medical care, changes in medications, dietary instructions, activity restrictions, and follow up appointments.   Follow-Up Appointments: Follow-up Information    Follow up with Primary doctor. Schedule an appointment as soon as possible for a visit in 1 week.   Why:  For hospital follow up      Kathee Delton, MD 10/04/2014, 3:19 PM PGY-1, [facility]

## 2014-10-03 NOTE — Progress Notes (Signed)
UR completed 

## 2014-10-03 NOTE — Progress Notes (Signed)
Pt D-Dimer came back negative and her UDS showed + cocaine after 2 days in the hospital.  She is improved, decreased wheezing, ready to go home.    Twana FirstBryan R. Paulina FusiHess, DO of [facility] 10/03/2014, 5:12 PM

## 2014-10-04 NOTE — Progress Notes (Signed)
Pt ready to leave hospital. Friend arrrived at 2000 and pt discharge by wheelchair. D/C papers placed in pt bag.

## 2016-08-30 ENCOUNTER — Observation Stay (HOSPITAL_COMMUNITY)
Admission: EM | Admit: 2016-08-30 | Discharge: 2016-09-01 | Disposition: A | Discharge status: Home / Self Care | Payer: Medicaid Other | Attending: Family Medicine | Admitting: Family Medicine

## 2016-08-30 ENCOUNTER — Encounter (HOSPITAL_COMMUNITY): Payer: Self-pay | Admitting: Emergency Medicine

## 2016-08-30 ENCOUNTER — Emergency Department (HOSPITAL_COMMUNITY): Payer: Medicaid Other

## 2016-08-30 ENCOUNTER — Observation Stay (HOSPITAL_COMMUNITY): Payer: Medicaid Other

## 2016-08-30 DIAGNOSIS — F1410 Cocaine abuse, uncomplicated: Secondary | ICD-10-CM | POA: Insufficient documentation

## 2016-08-30 DIAGNOSIS — T424X5A Adverse effect of benzodiazepines, initial encounter: Secondary | ICD-10-CM | POA: Insufficient documentation

## 2016-08-30 DIAGNOSIS — D509 Iron deficiency anemia, unspecified: Secondary | ICD-10-CM | POA: Diagnosis not present

## 2016-08-30 DIAGNOSIS — J45909 Unspecified asthma, uncomplicated: Secondary | ICD-10-CM

## 2016-08-30 DIAGNOSIS — R9082 White matter disease, unspecified: Secondary | ICD-10-CM | POA: Insufficient documentation

## 2016-08-30 DIAGNOSIS — M1990 Unspecified osteoarthritis, unspecified site: Secondary | ICD-10-CM | POA: Insufficient documentation

## 2016-08-30 DIAGNOSIS — I10 Essential (primary) hypertension: Secondary | ICD-10-CM

## 2016-08-30 DIAGNOSIS — J441 Chronic obstructive pulmonary disease with (acute) exacerbation: Principal | ICD-10-CM | POA: Diagnosis present

## 2016-08-30 DIAGNOSIS — J42 Unspecified chronic bronchitis: Secondary | ICD-10-CM | POA: Diagnosis not present

## 2016-08-30 DIAGNOSIS — G92 Toxic encephalopathy: Secondary | ICD-10-CM | POA: Insufficient documentation

## 2016-08-30 DIAGNOSIS — J449 Chronic obstructive pulmonary disease, unspecified: Secondary | ICD-10-CM | POA: Diagnosis present

## 2016-08-30 DIAGNOSIS — F17210 Nicotine dependence, cigarettes, uncomplicated: Secondary | ICD-10-CM | POA: Diagnosis not present

## 2016-08-30 DIAGNOSIS — J9601 Acute respiratory failure with hypoxia: Secondary | ICD-10-CM

## 2016-08-30 DIAGNOSIS — Y92230 Patient room in hospital as the place of occurrence of the external cause: Secondary | ICD-10-CM | POA: Insufficient documentation

## 2016-08-30 DIAGNOSIS — F1290 Cannabis use, unspecified, uncomplicated: Secondary | ICD-10-CM | POA: Insufficient documentation

## 2016-08-30 DIAGNOSIS — R400 Somnolence: Secondary | ICD-10-CM

## 2016-08-30 LAB — CBC
HCT: 29.5 % — ABNORMAL LOW (ref 36.0–46.0)
HEMATOCRIT: 29.9 % — AB (ref 36.0–46.0)
HEMOGLOBIN: 9 g/dL — AB (ref 12.0–15.0)
Hemoglobin: 9 g/dL — ABNORMAL LOW (ref 12.0–15.0)
MCH: 21.3 pg — ABNORMAL LOW (ref 26.0–34.0)
MCH: 21 pg — ABNORMAL LOW (ref 26.0–34.0)
MCHC: 30.5 g/dL (ref 30.0–36.0)
MCHC: 30.1 g/dL (ref 30.0–36.0)
MCV: 69.7 fL — ABNORMAL LOW (ref 78.0–100.0)
MCV: 69.7 fL — AB (ref 78.0–100.0)
Platelets: 338 10*3/uL (ref 150–400)
Platelets: 334 10*3/uL (ref 150–400)
RBC: 4.23 MIL/uL (ref 3.87–5.11)
RBC: 4.29 MIL/uL (ref 3.87–5.11)
RDW: 21.3 % — ABNORMAL HIGH (ref 11.5–15.5)
RDW: 21.2 % — AB (ref 11.5–15.5)
WBC: 10.7 10*3/uL — ABNORMAL HIGH (ref 4.0–10.5)
WBC: 8.9 10*3/uL (ref 4.0–10.5)

## 2016-08-30 LAB — BLOOD GAS, VENOUS
Acid-Base Excess: 2.4 mmol/L — ABNORMAL HIGH (ref 0.0–2.0)
Bicarbonate: 27.1 mmol/L (ref 20.0–28.0)
O2 Saturation: 68.6 %
Patient temperature: 98.6
pCO2, Ven: 45.7 mmHg (ref 44.0–60.0)
pH, Ven: 7.39 (ref 7.250–7.430)
pO2, Ven: 40.3 mmHg (ref 32.0–45.0)

## 2016-08-30 LAB — BASIC METABOLIC PANEL
Anion gap: 9 (ref 5–15)
BUN: 14 mg/dL (ref 6–20)
CALCIUM: 9.1 mg/dL (ref 8.9–10.3)
CO2: 25 mmol/L (ref 22–32)
Chloride: 105 mmol/L (ref 101–111)
Creatinine, Ser: 0.72 mg/dL (ref 0.44–1.00)
GFR calc non Af Amer: >60 mL/min (ref >60)
Glucose, Bld: 91 mg/dL (ref 65–99)
Potassium: 4.2 mmol/L (ref 3.5–5.1)
Sodium: 139 mmol/L (ref 135–145)

## 2016-08-30 LAB — RAPID URINE DRUG SCREEN, HOSP PERFORMED
Amphetamines: NOT DETECTED
BARBITURATES: NOT DETECTED
BENZODIAZEPINES: POSITIVE — AB
COCAINE: POSITIVE — AB
OPIATES: NOT DETECTED
TETRAHYDROCANNABINOL: NOT DETECTED

## 2016-08-30 LAB — IRON AND TIBC
Iron: 15 ug/dL — ABNORMAL LOW (ref 28–170)
SATURATION RATIOS: 3 % — AB (ref 10.4–31.8)
TIBC: 454 ug/dL — AB (ref 250–450)
UIBC: 439 ug/dL

## 2016-08-30 LAB — RETICULOCYTES
RBC.: 3.89 MIL/uL (ref 3.87–5.11)
RETIC CT PCT: 0.8 % (ref 0.4–3.1)
Retic Count, Absolute: 31.1 10*3/uL (ref 19.0–186.0)

## 2016-08-30 LAB — TROPONIN I: Troponin I: <0.03 ng/mL (ref <0.03)

## 2016-08-30 LAB — CREATININE, SERUM
Creatinine, Ser: 0.75 mg/dL (ref 0.44–1.00)
GFR calc Af Amer: >60 mL/min (ref >60)
GFR calc non Af Amer: >60 mL/min (ref >60)

## 2016-08-30 LAB — FERRITIN: Ferritin: 5 ng/mL — ABNORMAL LOW (ref 11–307)

## 2016-08-30 LAB — I-STAT TROPONIN, ED: TROPONIN I, POC: 0 ng/mL (ref 0.00–0.08)

## 2016-08-30 LAB — D-DIMER, QUANTITATIVE: D-Dimer, Quant: 0.34 ug/mL-FEU (ref 0.00–0.50)

## 2016-08-30 LAB — INFLUENZA PANEL BY PCR (TYPE A & B)
INFLAPCR: NEGATIVE
Influenza B By PCR: NEGATIVE

## 2016-08-30 LAB — TSH: TSH: 1.255 u[IU]/mL (ref 0.350–4.500)

## 2016-08-30 LAB — FOLATE: Folate: 14.5 ng/mL (ref >5.9)

## 2016-08-30 LAB — I-STAT CG4 LACTIC ACID, ED: LACTIC ACID, VENOUS: 0.82 mmol/L (ref 0.5–1.9)

## 2016-08-30 LAB — VITAMIN B12: Vitamin B-12: 307 pg/mL (ref 180–914)

## 2016-08-30 MED ORDER — ALBUTEROL SULFATE (2.5 MG/3ML) 0.083% IN NEBU: 2.5000 mg | INHALATION_SOLUTION | Freq: Four times a day (QID) | RESPIRATORY_TRACT | Status: DC | PRN

## 2016-08-30 MED ORDER — ALBUTEROL (5 MG/ML) CONTINUOUS INHALATION SOLN
10.0000 mg/h | INHALATION_SOLUTION | RESPIRATORY_TRACT | Status: DC
  Administered 2016-08-30: 10 mg/h via RESPIRATORY_TRACT

## 2016-08-30 MED ORDER — ACETAMINOPHEN 325 MG PO TABS: 650.0000 mg | ORAL_TABLET | Freq: Four times a day (QID) | ORAL | Status: DC | PRN

## 2016-08-30 MED ORDER — METHYLPREDNISOLONE SODIUM SUCC 125 MG IJ SOLR
60.0000 mg | Freq: Two times a day (BID) | INTRAMUSCULAR | Status: AC
  Administered 2016-08-30 – 2016-08-31 (×2): 60 mg via INTRAVENOUS
  Filled 2016-08-30 (×2): qty 2

## 2016-08-30 MED ORDER — IPRATROPIUM BROMIDE 0.02 % IN SOLN
0.5000 mg | Freq: Once | RESPIRATORY_TRACT | Status: AC
  Administered 2016-08-30: 0.5 mg via RESPIRATORY_TRACT

## 2016-08-30 MED ORDER — MAGNESIUM SULFATE 2 GM/50ML IV SOLN
2.0000 g | Freq: Once | INTRAVENOUS | Status: AC
  Administered 2016-08-30: 2 g via INTRAVENOUS
  Filled 2016-08-30: qty 50

## 2016-08-30 MED ORDER — ACETAMINOPHEN 650 MG RE SUPP: 650.0000 mg | Freq: Four times a day (QID) | RECTAL | Status: DC | PRN

## 2016-08-30 MED ORDER — SODIUM CHLORIDE 0.9 % IV SOLN
INTRAVENOUS | Status: DC
  Administered 2016-08-30 – 2016-08-31 (×2): via INTRAVENOUS

## 2016-08-30 MED ORDER — SENNOSIDES-DOCUSATE SODIUM 8.6-50 MG PO TABS: 1.0000 | ORAL_TABLET | Freq: Every evening | ORAL | Status: DC | PRN

## 2016-08-30 MED ORDER — LORAZEPAM 2 MG/ML IJ SOLN
0.5000 mg | Freq: Once | INTRAMUSCULAR | Status: AC
  Administered 2016-08-30: 0.5 mg via INTRAVENOUS
  Filled 2016-08-30: qty 1

## 2016-08-30 MED ORDER — METHYLPREDNISOLONE SODIUM SUCC 125 MG IJ SOLR
125.0000 mg | Freq: Once | INTRAMUSCULAR | Status: AC
  Administered 2016-08-30: 125 mg via INTRAVENOUS
  Filled 2016-08-30: qty 2

## 2016-08-30 MED ORDER — BISACODYL 10 MG RE SUPP: 10.0000 mg | Freq: Every day | RECTAL | Status: DC | PRN

## 2016-08-30 MED ORDER — PREDNISONE 20 MG PO TABS
40.0000 mg | ORAL_TABLET | Freq: Every day | ORAL | Status: DC
  Administered 2016-08-31: 40 mg via ORAL
  Filled 2016-08-30: qty 2

## 2016-08-30 MED ORDER — ENOXAPARIN SODIUM 40 MG/0.4ML ~~LOC~~ SOLN
40.0000 mg | Freq: Every day | SUBCUTANEOUS | Status: DC
  Administered 2016-08-30 – 2016-08-31 (×2): 40 mg via SUBCUTANEOUS
  Filled 2016-08-30 (×2): qty 0.4

## 2016-08-30 MED ORDER — NICOTINE 14 MG/24HR TD PT24
14.0000 mg | MEDICATED_PATCH | Freq: Every day | TRANSDERMAL | Status: DC
  Administered 2016-08-30 – 2016-09-01 (×2): 14 mg via TRANSDERMAL
  Filled 2016-08-30 (×3): qty 1

## 2016-08-30 MED ORDER — TRAMADOL HCL 50 MG PO TABS: 50.0000 mg | ORAL_TABLET | Freq: Four times a day (QID) | ORAL | Status: DC | PRN

## 2016-08-30 MED ORDER — ALBUTEROL SULFATE (2.5 MG/3ML) 0.083% IN NEBU
5.0000 mg | INHALATION_SOLUTION | Freq: Once | RESPIRATORY_TRACT | Status: AC
  Administered 2016-08-30: 5 mg via RESPIRATORY_TRACT
  Filled 2016-08-30: qty 6

## 2016-08-30 MED ORDER — IPRATROPIUM BROMIDE 0.02 % IN SOLN
RESPIRATORY_TRACT | Status: AC
  Filled 2016-08-30: qty 2.5

## 2016-08-30 MED ORDER — MOMETASONE FURO-FORMOTEROL FUM 200-5 MCG/ACT IN AERO
2.0000 | INHALATION_SPRAY | Freq: Two times a day (BID) | RESPIRATORY_TRACT | Status: DC
  Administered 2016-08-30 – 2016-08-31 (×3): 2 via RESPIRATORY_TRACT
  Filled 2016-08-30 (×2): qty 8.8

## 2016-08-30 MED ORDER — IPRATROPIUM-ALBUTEROL 0.5-2.5 (3) MG/3ML IN SOLN
3.0000 mL | Freq: Four times a day (QID) | RESPIRATORY_TRACT | Status: DC
  Administered 2016-08-30 – 2016-09-01 (×5): 3 mL via RESPIRATORY_TRACT
  Filled 2016-08-30 (×5): qty 3

## 2016-08-30 MED ORDER — GUAIFENESIN ER 600 MG PO TB12: 600.0000 mg | ORAL_TABLET | Freq: Two times a day (BID) | ORAL | Status: DC | PRN

## 2016-08-30 MED ORDER — MONTELUKAST SODIUM 10 MG PO TABS
10.0000 mg | ORAL_TABLET | Freq: Every day | ORAL | Status: DC
  Administered 2016-08-30 – 2016-08-31 (×2): 10 mg via ORAL
  Filled 2016-08-30 (×2): qty 1

## 2016-08-30 MED ORDER — SODIUM CHLORIDE 0.9% FLUSH: 3.0000 mL | Freq: Two times a day (BID) | INTRAVENOUS | Status: DC

## 2016-08-30 NOTE — ED Triage Notes (Addendum)
Pt reports she began to have a cough, generalized body aches, and chest pain yesterday. Hx of chronic bronchitis. Pt also endorses SOB. No dizziness/lightheadedness.

## 2016-08-30 NOTE — ED Provider Notes (Signed)
[facility]-EMERGENCY DEPT Provider Note   CSN: [id number] Arrival date & time: 08/30/16  1236     History   Chief Complaint Chief Complaint  Patient presents with  . Chest Pain    HPI Vanessa Wallace is a 61 y.o. female.  The history is provided by the patient and a relative. No language interpreter was used.  Chest Pain     Vanessa Wallace is a 61 y.o. female who presents to the Emergency Department complaining of sob.  She presents to the emergency department after referral from her dentist for shortness of breath. She has a history of asthma and has been using her treatments more frequently since Sunday. She was told she had a fever at the dentist office but does not report fevers at home. She did cough up a small amount of blood today. She has severe chest tightness and discomfort with coughing. No vomiting, leg swelling or pain. She smokes cigarettes and occasional marijuana. She drinks alcohol occasionally. She denies any cocaine use. Past Medical History:  Diagnosis Date  . Arthritis    "all over" (09/17/2013)  . Asthma   . CAP (community acquired pneumonia) 09/17/2013  . Chronic bronchitis (HCC)    "flares up w/my asthma" (09/17/2013)  . Hypertension   . Migraines    "weekly lately" (09/17/2013)    Patient Active Problem List   Diagnosis Date Noted  . COPD exacerbation (HCC) 08/30/2016  . COPD (chronic obstructive pulmonary disease) (HCC) 08/30/2016  . Asthma exacerbation 10/02/2014  . Renal insufficiency   . Chest wall pain   . Cough   . Cocaine use 09/22/2013  . Pneumonia 09/17/2013  . Community acquired pneumonia 09/17/2013    Past Surgical History:  Procedure Laterality Date  . ABDOMINAL HYSTERECTOMY     "partial"  . BUNIONECTOMY Bilateral    "right one came back after OR" (09/17/2013)  . LACERATION REPAIR Left 06/1999   Repair FPL, radial digital nerve, intrinsics, left thumb./notes 06/29/1999 (09/17/2013)  . TUBAL LIGATION      OB History    No data available         Home Medications    Prior to Admission medications   Medication Sig Start Date End Date Taking? Authorizing Provider  albuterol (PROVENTIL HFA;VENTOLIN HFA) 108 (90 BASE) MCG/ACT inhaler Inhale 1-2 puffs into the lungs every 6 (six) hours as needed for wheezing or shortness of breath.    Yes Historical Provider, MD  albuterol (PROVENTIL) (2.5 MG/3ML) 0.083% nebulizer solution Take 2.5 mg by nebulization every 6 (six) hours as needed for wheezing or shortness of breath.    Yes Historical Provider, MD  budesonide-formoterol (SYMBICORT) 160-4.5 MCG/ACT inhaler Inhale 2 puffs into the lungs 2 (two) times daily.   Yes Historical Provider, MD  guaiFENesin (MUCINEX) 600 MG 12 hr tablet Take 600 mg by mouth 2 (two) times daily as needed for cough or to loosen phlegm.   Yes Historical Provider, MD  lisinopril-hydrochlorothiazide (PRINZIDE,ZESTORETIC) 10-12.5 MG tablet Take 1 tablet by mouth daily.   Yes Historical Provider, MD  montelukast (SINGULAIR) 10 MG tablet Take 10 mg by mouth at bedtime.   Yes Historical Provider, MD    Family History History reviewed. No pertinent family history.  Social History Social History  Substance Use Topics  . Smoking status: Former Smoker    Packs/day: 0.30    Years: 40.00    Types: Cigarettes    Quit date: 09/18/2014  . Smokeless tobacco: Never Used  Comment: 09/17/2013 "weaned down from 2 ppd of cigarettes"  . Alcohol use No     Comment: Quit 2 weeks     Allergies   Patient has no known allergies.   Review of Systems Review of Systems  Cardiovascular: Positive for chest pain.  All other systems reviewed and are negative.    Physical Exam Updated Vital Signs BP 109/59 (BP Location: Left Arm)   Pulse 78   Temp 99.2 F (37.3 C) (Oral)   Resp 16   Ht 5\' 3"  (1.6 m)   Wt 126 lb 3.2 oz (57.2 kg)   SpO2 99%   BMI 22.36 kg/m   Physical Exam  Constitutional: She is oriented to person, place, and time. She appears well-developed and  well-nourished.  HENT:  Head: Normocephalic and atraumatic.  Cardiovascular: Normal rate and regular rhythm.   No murmur heard. Pulmonary/Chest: She is in respiratory distress.  Tachypnea, accessory muscle use, decreased air movement bilaterally  Abdominal: Soft. There is no tenderness. There is no rebound and no guarding.  Musculoskeletal: She exhibits no edema or tenderness.  Neurological: She is alert and oriented to person, place, and time.  Skin: Skin is warm and dry.  Psychiatric:  anxious  Nursing note and vitals reviewed.    ED Treatments / Results  Labs (all labs ordered are listed, but only abnormal results are displayed) Labs Reviewed  CBC - Abnormal; Notable for the following:       Result Value   WBC 10.7 (*)    Hemoglobin 9.0 (*)    HCT 29.5 (*)    MCV 69.7 (*)    MCH 21.3 (*)    RDW 21.3 (*)    All other components within normal limits  BLOOD GAS, VENOUS - Abnormal; Notable for the following:    Acid-Base Excess 2.4 (*)    All other components within normal limits  RESPIRATORY PANEL BY PCR  BASIC METABOLIC PANEL  D-DIMER, QUANTITATIVE (NOT AT [facility])  INFLUENZA PANEL BY PCR (TYPE A & B)  VITAMIN B12  FOLATE  IRON AND TIBC  FERRITIN  RETICULOCYTES  TROPONIN I  TROPONIN I  TROPONIN I  I-STAT TROPOININ, ED  I-STAT CG4 LACTIC ACID, ED    EKG  EKG Interpretation  Date/Time:  Thursday August 30 2016 12:56:10 EST Ventricular Rate:  90 PR Interval:    QRS Duration: 90 QT Interval:  393 QTC Calculation: 481 R Axis:   78 Text Interpretation:  Sinus rhythm Borderline short PR interval Left ventricular hypertrophy Borderline T abnormalities, inferior leads Minimal ST elevation, lateral leads Baseline wander in lead(s) II V1 V2  Poor data quality, interpretation may be adversely affected Confirmed by Lincoln Brigham [phone]) on 08/30/2016 2:09:09 PM       Radiology Ct Head Wo Contrast  Result Date: 08/30/2016 CLINICAL DATA:  Somnolence. EXAM: CT HEAD  WITHOUT CONTRAST TECHNIQUE: Contiguous axial images were obtained from the base of the skull through the vertex without intravenous contrast. COMPARISON:  None. FINDINGS: Brain: Asymmetric low-density in the left thalamus without volume loss. There is moderate for age white matter disease with a biparietal predominance, likely chronic microvascular ischemia in this patient with vascular risk factors. No acute hemorrhage, hydrocephalus, or suspected mass. Vascular: Atherosclerotic calcification. Skull: No acute or aggressive finding Sinuses/Orbits: No acute finding IMPRESSION: 1. Low-density in the left thalamus could reflect acute infarct in the appropriate clinical setting. Correlate for contralateral symptoms. 2. Moderate white matter disease, likely chronic microvascular ischemia. Electronically Signed  By: Marnee SpringJonathon  Watts M.D.   On: 08/30/2016 18:16   Dg Chest Port 1 View  Result Date: 08/30/2016 CLINICAL DATA:  Cough, body aches and chest pain since yesterday. Shortness of breath. EXAM: PORTABLE CHEST 1 VIEW COMPARISON:  PA and lateral chest 10/02/2014. FINDINGS: Heart size is upper normal and there is vascular congestion. No consolidative process, pneumothorax or pleural effusion. No focal bony abnormality. Atherosclerosis noted. IMPRESSION: Pulmonary vascular congestion without focal process. Atherosclerosis. Electronically Signed   By: Drusilla Kannerhomas  Dalessio M.D.   On: 08/30/2016 13:56    Procedures Procedures (including critical care time)  Medications Ordered in ED Medications  albuterol (PROVENTIL,VENTOLIN) solution continuous neb (10 mg/hr Nebulization New Bag/Given 08/30/16 1320)  ipratropium (ATROVENT) 0.02 % nebulizer solution (  Not Given 08/30/16 1320)  LORazepam (ATIVAN) injection 0.5 mg (0.5 mg Intravenous Given 08/30/16 1334)  magnesium sulfate IVPB 2 g 50 mL (0 g Intravenous Stopped 08/30/16 1353)  ipratropium (ATROVENT) nebulizer solution 0.5 mg (0.5 mg Nebulization Given 08/30/16 1320)    methylPREDNISolone sodium succinate (SOLU-MEDROL) 125 mg/2 mL injection 125 mg (125 mg Intravenous Given 08/30/16 1334)  albuterol (PROVENTIL) (2.5 MG/3ML) 0.083% nebulizer solution 5 mg (5 mg Nebulization Given 08/30/16 1702)     Initial Impression / Assessment and Plan / ED Course  I have reviewed the triage vital signs and the nursing notes.  Pertinent labs & imaging results that were available during my care of the patient were reviewed by me and considered in my medical decision making (see chart for details).    Pt here with increased SOB, hx/o COPD.  She had significant increased work of breathing on initial evaluation with decreased air movement, also with significant anxiety - treated with albuterol, solumedrol, magnesium, ativan.  On repeat assesment she is improved but has increased oxygen requirement to 2L.  Her sats drop to mid 80s off oxygen, rise to low to mid 90s on oxygen.  Repeat lung exam with improved work of breathing, occasional end expiratory wheeze.  Providing additional treatment with plan to admit to hospitalist service for further treatment.    Final Clinical Impressions(s) / ED Diagnoses   Final diagnoses:  COPD with acute exacerbation (HCC)  Somnolence    New Prescriptions New Prescriptions   No medications on file     Tilden FossaElizabeth Chalyn Amescua, MD 08/30/16 [id number]

## 2016-08-30 NOTE — Progress Notes (Signed)
Unable to obtain ABG; MD aware.  

## 2016-08-30 NOTE — H&P (Signed)
History and Physical    Vanessa FordVivian A Postlewaite ZHY:[id number] DOB: Feb 04, 1956 DOA: 08/30/2016  PCP: PROVIDER NOT IN SYSTEM   Patient coming from: Home  Chief Complaint: Difficulty Breathing; Coughing up Phlegm with blood  HPI: Vanessa Wallace is a 61 y.o. female with medical history significant of HTN, Chronic Bronchitis/COPD with Asthma, HTN, Cocaine Abuse and other comorbids who [facility] because of difficulty breathing. Patient was very somnolent and and would not cooperate or answer questions because of the Ativan she received so a subjective Hx was not able to be obtained by her. Hx was obtained by family member who stated the patient had been having more wheezing, SOB, and difficulty breathing the last 2 days and had been using her inhalers more (symbicort and Albuterol) without much relief. It was also noted the patient was more lethargic and sleepy the last 2 days. This morning patient's sister came into take her to the dentist and patient slept the entire way to the dentist and at the dentist it was noted she had a fever. Patient's sister got concerned and brought the patient to the ER when the patient coughed up phlegm with some blood. Sister stated she denied any other complaints. When patient was awoken she admitted to having some mild chest tightness and per sister still continues to actively smoke cigarettes.   ED Course: Was given Lorazepam, Magnesium, Albuterol and Atrovent Nebs as well as Solumedrol and had a CXR, VBG, and basic blood work. No Blood Cx were obtained and POC Troponin was 0.00 and D-Dimer was 0.34. Patient desaturated into the mid 80's on room air per EDP and was placed on Supplemental O2 via [place].   Review of Systems: As per HPI otherwise 10 point review of systems negative.   Past Medical History:  Diagnosis Date  . Arthritis    "all over" (09/17/2013)  . Asthma   . CAP (community acquired pneumonia) 09/17/2013  . Chronic bronchitis (HCC)    "flares up w/my asthma" (09/17/2013)  . Hypertension   . Migraines    "weekly lately" (09/17/2013)    Past Surgical History:  Procedure Laterality Date  . ABDOMINAL HYSTERECTOMY     "partial"  . BUNIONECTOMY Bilateral    "right one came back after OR" (09/17/2013)  . LACERATION REPAIR Left 06/1999   Repair FPL, radial digital nerve, intrinsics, left thumb./notes 06/29/1999 (09/17/2013)  . TUBAL LIGATION     SOCIAL HISTORY Still actively smoking Cigarettes. She has a 12.00 pack-year smoking history. She has never used smokeless tobacco. She reports that she uses drugs, including Marijuana and Cocaine. She reports that she drinks alcohol occasionally.  No Known Allergies  FAMILY HISTORY History attempted but patient too somnolent to provide a subjective Hx.   Prior to Admission medications   Medication Sig Start Date End Date Taking? Authorizing Provider  albuterol (PROVENTIL HFA;VENTOLIN HFA) 108 (90 BASE) MCG/ACT inhaler Inhale 1-2 puffs into the lungs every 6 (six) hours as needed for wheezing or shortness of breath.    Yes Historical Provider, MD  albuterol (PROVENTIL) (2.5 MG/3ML) 0.083% nebulizer solution Take 2.5 mg by nebulization every 6 (six) hours as needed for wheezing or shortness of breath.    Yes Historical Provider, MD  budesonide-formoterol (SYMBICORT) 160-4.5 MCG/ACT inhaler Inhale 2 puffs into the lungs 2 (two) times daily.   Yes Historical Provider, MD  guaiFENesin (MUCINEX) 600 MG 12 hr tablet Take 600 mg by mouth 2 (two) times daily as needed  for cough or to loosen phlegm.   Yes Historical Provider, MD  lisinopril-hydrochlorothiazide (PRINZIDE,ZESTORETIC) 10-12.5 MG tablet Take 1 tablet by mouth daily.   Yes Historical Provider, MD  montelukast (SINGULAIR) 10 MG tablet Take 10 mg by mouth at bedtime.   Yes Historical Provider, MD   Physical Exam: Vitals:   08/30/16 1240 08/30/16 1320 08/30/16 1323 08/30/16 1612  BP: 155/79  144/96 109/59  Pulse: 97 110 71 93  Resp:  24 25 22 22   Temp: 99.1 F (37.3 C)  99.2 F (37.3 C)   TempSrc: Oral  Oral   SpO2: 91% 100% 100% 98%  Weight: 57.2 kg (126 lb 3.2 oz)     Height: 5\' 3"  (1.6 m)      Constitutional: NAD wearing supplemental O2 via [place] and extremely somnolent/sleepy ENMT: External Ears, Nose appear normal. Grossly normal hearing. Mucous membranes are slightly dry. Neck: Appears normal, supple, no cervical masses, normal ROM, no appreciable thyromegaly, no JVD Respiratory: Diminished to  auscultation bilaterally with some expiratory wheezing. No rales, rhonchi or crackles. Normal respiratory effort and patient is not tachypenic. No accessory muscle use.  Cardiovascular: RRR, no murmurs / rubs / gallops. S1 and S2 auscultated. No extremity edema. Abdomen: Soft, non-tender, non-distended. No masses palpated. No appreciable hepatosplenomegaly. Bowel sounds positive x4.  GU: Deferred. Musculoskeletal: No clubbing / cyanosis of digits/nails. No joint deformity upper and lower extremities.  Skin: No rashes, lesions, ulcers on limited skin eval. No induration; Warm and dry.  Neurologic: Extremely somnolent and would not participate in exam Psychiatric: Flat affect and guarded  Labs on Admission: I have personally reviewed following labs and imaging studies  CBC:  Recent Labs Lab 08/30/16 1318  WBC 10.7*  HGB 9.0*  HCT 29.5*  MCV 69.7*  PLT 338   Basic Metabolic Panel:  Recent Labs Lab 08/30/16 1318  NA 139  K 4.2  CL 105  CO2 25  GLUCOSE 91  BUN 14  CREATININE 0.72  CALCIUM 9.1   GFR: Estimated Creatinine Clearance: 62.6 mL/min (by C-G formula based on SCr of 0.72 mg/dL). Liver Function Tests: No results for input(s): AST, ALT, ALKPHOS, BILITOT, PROT, ALBUMIN in the last 168 hours. No results for input(s): LIPASE, AMYLASE in the last 168 hours. No results for input(s): AMMONIA in the last 168 hours. Coagulation Profile: No results for input(s): INR, PROTIME in the last 168 hours. Cardiac  Enzymes: No results for input(s): CKTOTAL, CKMB, CKMBINDEX, TROPONINI in the last 168 hours. BNP (last 3 results) No results for input(s): PROBNP in the last 8760 hours. HbA1C: No results for input(s): HGBA1C in the last 72 hours. CBG: No results for input(s): GLUCAP in the last 168 hours. Lipid Profile: No results for input(s): CHOL, HDL, LDLCALC, TRIG, CHOLHDL, LDLDIRECT in the last 72 hours. Thyroid Function Tests: No results for input(s): TSH, T4TOTAL, FREET4, T3FREE, THYROIDAB in the last 72 hours. Anemia Panel: No results for input(s): VITAMINB12, FOLATE, FERRITIN, TIBC, IRON, RETICCTPCT in the last 72 hours. Urine analysis: No results found for: COLORURINE, APPEARANCEUR, LABSPEC, PHURINE, GLUCOSEU, HGBUR, BILIRUBINUR, KETONESUR, PROTEINUR, UROBILINOGEN, NITRITE, LEUKOCYTESUR Sepsis Labs: !!!!!!!!!!!!!!!!!!!!!!!!!!!!!!!!!!!!!!!!!!!! @LABRCNTIP (procalcitonin:4,lacticidven:4) )No results found for this or any previous visit (from the past 240 hour(s)).   Radiological Exams on Admission: Dg Chest Port 1 View  Result Date: 08/30/2016 CLINICAL DATA:  Cough, body aches and chest pain since yesterday. Shortness of breath. EXAM: PORTABLE CHEST 1 VIEW COMPARISON:  PA and lateral chest 10/02/2014. FINDINGS: Heart size is upper normal and there is  vascular congestion. No consolidative process, pneumothorax or pleural effusion. No focal bony abnormality. Atherosclerosis noted. IMPRESSION: Pulmonary vascular congestion without focal process. Atherosclerosis. Electronically Signed   By: Drusilla Kanner M.D.   On: 08/30/2016 13:56    EKG: Independently reviewed. Poor quality EKG showing Sinus Rhythm and LVH. Will repeat  Assessment/Plan Active Problems:   COPD exacerbation (HCC)  Acute Respiratory Failure likely 2/2 Asthma/COPD Exacerbation  -Place in Obs Tele -COPD Pathway -Per Family Member has been coughing up more phlegm, using more breathing treatments and even coughed up some  blood -Given Magnesium in ER -DuoNebs, Steroids, Azithromycin, and Supplemental O2 -D-Dimer was 0.34 and low suspicion for PE at this point however consider CT Chest if not improving -Incentive Spirometry -CXR reads as pulmonary vascular congestion without focal process -Repeat Chest X-Ray in AM -Flu PCR Negative; Check Respiratory Viral Panel -Repeat CXR in AM -Wean O2 as Tolerated; Maintain Saturations >92% -Continue with Dulera (pharmacy substitution)  Somnolence -Likely from Ativan however per family member patient has been in bed for 2 days -ABG to assess for Hypercarbia ordered but patient would not allow stick -Head CT w/o Contrast -Urine Drug Screen as has Hx of Cocaine abuse  Hypertension -Hold current Home Antihypertensives -Restart in AM  Microcytic Anemia -Likely Iron Deficiency -Check Anemia Panel -Monitor H/H's closely as patient had Hemoptysis -Repeat CBC in Am  Chest Tightness -Initial Troponin 0.00 -Cycle Troponin I's -Repeat EKG  DVT prophylaxis: Lovenox  Code Status: FULL CODE Family Communication: Discussed  Disposition Plan: Home at D/C Consults called: None Admission status: Obs Tele  Merlene Laughter, D.O. [facility] Pager [phone]  If 7PM-7AM, please contact night-coverage [email or web address] Password [facility]  08/30/2016, 4:44 PM

## 2016-08-30 NOTE — ED Notes (Signed)
Best EKG messy in triage, will need new EKG

## 2016-08-31 ENCOUNTER — Observation Stay (HOSPITAL_COMMUNITY): Payer: Medicaid Other

## 2016-08-31 DIAGNOSIS — J42 Unspecified chronic bronchitis: Secondary | ICD-10-CM | POA: Diagnosis not present

## 2016-08-31 LAB — RESPIRATORY PANEL BY PCR
ADENOVIRUS-RVPPCR: NOT DETECTED
BORDETELLA PERTUSSIS-RVPCR: NOT DETECTED
CHLAMYDOPHILA PNEUMONIAE-RVPPCR: NOT DETECTED
CORONAVIRUS NL63-RVPPCR: NOT DETECTED
Coronavirus 229E: NOT DETECTED
Coronavirus HKU1: NOT DETECTED
Coronavirus OC43: NOT DETECTED
INFLUENZA A-RVPPCR: NOT DETECTED
Influenza B: NOT DETECTED
METAPNEUMOVIRUS-RVPPCR: NOT DETECTED
Mycoplasma pneumoniae: NOT DETECTED
PARAINFLUENZA VIRUS 2-RVPPCR: NOT DETECTED
PARAINFLUENZA VIRUS 3-RVPPCR: NOT DETECTED
PARAINFLUENZA VIRUS 4-RVPPCR: NOT DETECTED
Parainfluenza Virus 1: NOT DETECTED
RHINOVIRUS / ENTEROVIRUS - RVPPCR: NOT DETECTED
Respiratory Syncytial Virus: NOT DETECTED

## 2016-08-31 LAB — TROPONIN I
Troponin I: <0.03 ng/mL (ref <0.03)
Troponin I: <0.03 ng/mL (ref <0.03)

## 2016-08-31 MED ORDER — HYDROCHLOROTHIAZIDE 12.5 MG PO CAPS
12.5000 mg | ORAL_CAPSULE | Freq: Every day | ORAL | Status: DC
  Administered 2016-09-01: 12.5 mg via ORAL
  Filled 2016-08-31: qty 1

## 2016-08-31 MED ORDER — LISINOPRIL 10 MG PO TABS
10.0000 mg | ORAL_TABLET | Freq: Every day | ORAL | Status: DC
  Administered 2016-09-01: 10 mg via ORAL
  Filled 2016-08-31: qty 1

## 2016-08-31 MED ORDER — LISINOPRIL-HYDROCHLOROTHIAZIDE 10-12.5 MG PO TABS
1.0000 | ORAL_TABLET | Freq: Every day | ORAL | Status: DC
Start: 2016-08-31 — End: 2016-08-31

## 2016-08-31 MED ORDER — FERROUS SULFATE 325 (65 FE) MG PO TABS
325.0000 mg | ORAL_TABLET | Freq: Every day | ORAL | Status: DC
  Administered 2016-08-31 – 2016-09-01 (×2): 325 mg via ORAL
  Filled 2016-08-31 (×2): qty 1

## 2016-08-31 MED ORDER — LISINOPRIL 10 MG PO TABS
10.0000 mg | ORAL_TABLET | Freq: Every day | ORAL | Status: DC
  Administered 2016-08-31: 10 mg via ORAL
  Filled 2016-08-31 (×2): qty 1

## 2016-08-31 MED ORDER — LORAZEPAM 2 MG/ML IJ SOLN: 0.5000 mg | INTRAMUSCULAR | Status: DC | PRN

## 2016-08-31 MED ORDER — HYDROCHLOROTHIAZIDE 12.5 MG PO CAPS
12.5000 mg | ORAL_CAPSULE | Freq: Every day | ORAL | Status: DC
  Administered 2016-08-31: 12.5 mg via ORAL
  Filled 2016-08-31: qty 1

## 2016-08-31 NOTE — Care Management Note (Signed)
Case Management Note  Patient Details  Name: Vanessa Wallace MRN: [id number] Date of Birth: 11/27/1955  Subjective/Objective: 61 y/o f admitted w/ COPD. From home. +cocaine,benzo's. Hx: etoh. Has neb machine. PT cons-await recc.                   Action/Plan:d/c plan home.   Expected Discharge Date:   (unknown)               Expected Discharge Plan:  Home/Self Care  In-House Referral:     Discharge planning Services  CM Consult  Post Acute Care Choice:    Choice offered to:     DME Arranged:    DME Agency:     HH Arranged:    HH Agency:     Status of Service:  In process, will continue to follow  If discussed at Long Length of Stay Meetings, dates discussed:    Additional Comments:  Lanier ClamMahabir, Marithza Malachi, RN 08/31/2016, 11:33 AM

## 2016-08-31 NOTE — Care Management Note (Signed)
Case Management Note  Patient Details  Name: Vanessa Wallace MRN: [id number] Date of Birth: [birth year]  Subjective/Objective: Referral for COPD gold-has pcp, pharmacy,has meds, only 1 adm/past 6 months. PT-no f/u. No CM needs identified.                   Action/Plan:d/c plan home.   Expected Discharge Date:   (unknown)               Expected Discharge Plan:  Home/Self Care  In-House Referral:     Discharge planning Services  CM Consult  Post Acute Care Choice:    Choice offered to:     DME Arranged:    DME Agency:     HH Arranged:    HH Agency:     Status of Service:  In process, will continue to follow  If discussed at Long Length of Stay Meetings, dates discussed:    Additional Comments:  Lanier ClamMahabir, Yandriel Boening, RN 08/31/2016, 11:35 AM

## 2016-08-31 NOTE — Evaluation (Signed)
Occupational Therapy Evaluation Patient Details Name: Vanessa Wallace MRN: [id number] DOB: [birth year] Today's Date: 08/31/2016    History of Present Illness 61 yo female admitted with COPD exac. Hx of arthritis, HTN, COPD, cocaine use, Pna, migraines.    Clinical Impression   Pt admitted with COPD exacerbation. Pt currently with functional limitations due to the deficits listed below (see OT Problem List).  Pt will benefit from skilled OT to increase their safety and independence with ADL and functional mobility for ADL to facilitate discharge to venue listed below.      Follow Up Recommendations  No OT follow up    Equipment Recommendations  None recommended by OT       Precautions / Restrictions Precautions Precautions: Fall Restrictions Weight Bearing Restrictions: No      Mobility Bed Mobility Overal bed mobility: Independent                Transfers Overall transfer level: Independent                         ADL Overall ADL's : Needs assistance/impaired     Grooming: Set up;Sitting   Upper Body Bathing: Set up;Sitting   Lower Body Bathing: Minimal assistance;Sit to/from stand;Cueing for safety;Cueing for sequencing;Cueing for compensatory techniques   Upper Body Dressing : Set up;Sitting   Lower Body Dressing: Minimal assistance;Sit to/from stand;Cueing for safety;Cueing for sequencing;Cueing for compensatory techniques   Toilet Transfer: Supervision/safety;Ambulation;Regular Toilet   Toileting- ArchitectClothing Manipulation and Hygiene: Supervision/safety;Sit to/from stand;Cueing for safety;Cueing for sequencing;Cueing for compensatory techniques         General ADL Comments: educated pt in basic energy conservation - will benefit from further education. pt needed constant VC to slow down and be safe with transfers and transitions               Pertinent Vitals/Pain Pain Assessment: No/denies pain     Hand Dominance      Extremity/Trunk Assessment Upper Extremity Assessment Upper Extremity Assessment: Generalized weakness   Lower Extremity Assessment Lower Extremity Assessment: Generalized weakness   Cervical / Trunk Assessment Cervical / Trunk Assessment: Normal   Communication Communication Communication: No difficulties   Cognition Arousal/Alertness: Awake/alert Behavior During Therapy: Anxious Overall Cognitive Status: Within Functional Limits for tasks assessed                           Shoulder Instructions      Home Living Family/patient expects to be discharged to:: Private residence Living Arrangements: Spouse/significant other Available Help at Discharge: Family Type of Home: House       Home Layout: One level     Bathroom Shower/Tub: Chief Strategy OfficerTub/shower unit   Bathroom Toilet: Standard     Home Equipment: None          Prior Functioning/Environment Level of Independence: Independent                 OT Problem List: Decreased strength;Decreased safety awareness   OT Treatment/Interventions: Self-care/ADL training;DME and/or AE instruction;Patient/family education    OT Goals(Current goals can be found in the care plan section) Acute Rehab OT Goals Patient Stated Goal: home tomorrow OT Goal Formulation: With patient Time For Goal Achievement: 09/07/16 Potential to Achieve Goals: Good  OT Frequency: Min 2X/week              End of Session Nurse Communication: Mobility status  Activity Tolerance: Patient tolerated treatment  well Patient left: with call bell/phone within reach;in bed;with family/visitor present;with bed alarm set   Time: 4098-1191 OT Time Calculation (min): 9 min Charges:  OT General Charges $OT Visit: 1 Procedure OT Evaluation $OT Eval Moderate Complexity: 1 Procedure G-Codes:    Alba Cory 09-16-2016, 12:16 PM

## 2016-08-31 NOTE — Evaluation (Signed)
Physical Therapy Evaluation Patient Details Name: Vanessa Wallace MRN: [id number] DOB: Jun 02, 1956 Today's Date: 08/31/2016   History of Present Illness  61 yo female admitted with COPD exac. Hx of arthritis, HTN, COPD, cocaine use, Pna, migraines.   Clinical Impression  On eval, pt was supervision level assist for mobility. Pt initially declined participation but then agreed to walk in room. O2 sats 89% on RA, dyspnea 2/4 during activity. She tolerated activity fairly well. Do not anticipate any follow up PT needs at discharge. Recommend daily ambulation in hallway with nursing supervision. Would also recommend nursing reassess O2 sats prior to d/c.     Follow Up Recommendations No PT follow up;Supervision for mobility/OOB    Equipment Recommendations  None recommended by PT    Recommendations for Other Services       Precautions / Restrictions Precautions Precautions: Fall Restrictions Weight Bearing Restrictions: No      Mobility  Bed Mobility Overal bed mobility: Independent                Transfers Overall transfer level: Independent                  Ambulation/Gait Ambulation/Gait assistance: Supervision Ambulation Distance (Feet): 25 Feet (in room) Assistive device: None Gait Pattern/deviations: Step-through pattern     General Gait Details: for safety. O2 sats 89% on RA, dyspnea 2/4.   Stairs            Wheelchair Mobility    Modified Rankin (Stroke Patients Only)       Balance                                             Pertinent Vitals/Pain Pain Assessment: No/denies pain    Home Living Family/patient expects to be discharged to:: Private residence Living Arrangements: Spouse/significant other   Type of Home: House         Home Equipment: None      Prior Function Level of Independence: Independent               Hand Dominance        Extremity/Trunk Assessment   Upper Extremity  Assessment Upper Extremity Assessment: Defer to OT evaluation    Lower Extremity Assessment Lower Extremity Assessment: Generalized weakness    Cervical / Trunk Assessment Cervical / Trunk Assessment: Normal  Communication   Communication: No difficulties  Cognition Arousal/Alertness: Awake/alert Behavior During Therapy: Anxious Overall Cognitive Status: Within Functional Limits for tasks assessed                      General Comments      Exercises     Assessment/Plan    PT Assessment Patient needs continued PT services  PT Problem List Decreased mobility;Decreased activity tolerance          PT Treatment Interventions Gait training;Therapeutic activities;Therapeutic exercise;Patient/family education;Functional mobility training    PT Goals (Current goals can be found in the Care Plan section)  Acute Rehab PT Goals Patient Stated Goal: home tomorrow PT Goal Formulation: With patient Time For Goal Achievement: 09/14/16 Potential to Achieve Goals: Good    Frequency Min 3X/week   Barriers to discharge        Co-evaluation               End of Session   Activity Tolerance: Patient  tolerated treatment well Patient left: in bed;with call bell/phone within reach;with family/visitor present      Functional Assessment Tool Used: clinical judgement Functional Limitation: Mobility: Walking and moving around Mobility: Walking and Moving Around Current Status ([id number]): At least 1 percent but less than 20 percent impaired, limited or restricted Mobility: Walking and Moving Around Goal Status [phone]([id number]): At least 1 percent but less than 20 percent impaired, limited or restricted    Time: 1014-1029 PT Time Calculation (min) (ACUTE ONLY): 15 min   Charges:   PT Evaluation $PT Eval Low Complexity: 1 Procedure     PT G Codes:   PT G-Codes **NOT FOR INPATIENT CLASS** Functional Assessment Tool Used: clinical judgement Functional Limitation: Mobility:  Walking and moving around Mobility: Walking and Moving Around Current Status ([id number]([id number]): At least 1 percent but less than 20 percent impaired, limited or restricted Mobility: Walking and Moving Around Goal Status [phone]([id number]): At least 1 percent but less than 20 percent impaired, limited or restricted    Rebeca AlertJannie Holland Kotter, MPT Pager: [phone]
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
## 2016-08-31 NOTE — Progress Notes (Addendum)
Nutrition Brief Note  Patient identified per COPD Gold protocol.   Wt Readings from Last 15 Encounters:  08/31/16 126 lb (57.2 kg)  10/02/14 145 lb (65.8 kg)  09/17/13 125 lb 6.4 oz (56.9 kg)    Body mass index is 22.32 kg/m. Patient meets criteria for normal weight based on current BMI. Skin WDL. Pt with hx of cocaine abuse and UDS was positive for the same on admission.   Current diet order is Heart Healthy. Pt was discussed during AM rounds today and RN reports that pt is eating very well without any issues; flow sheet shows pt ate 100% of breakfast this AM. Labs and medications reviewed.   No nutrition interventions warranted at this time. If nutrition issues arise, please consult RD.     Vanessa GammonJessica Danyelle Brookover, MS, RD, LDN, [facility] Inpatient Clinical Dietitian Pager # [phone] After hours/weekend pager # [phone]

## 2016-09-01 DIAGNOSIS — J42 Unspecified chronic bronchitis: Secondary | ICD-10-CM | POA: Diagnosis not present

## 2016-09-01 LAB — HEMOGLOBIN A1C
HEMOGLOBIN A1C: 5.5 % (ref 4.8–5.6)
Mean Plasma Glucose: 111 mg/dL

## 2016-09-01 MED ORDER — PREDNISONE 20 MG PO TABS: 40.0000 mg | ORAL_TABLET | Freq: Every day | ORAL | 0 refills | Status: DC

## 2016-09-01 MED ORDER — BUDESONIDE-FORMOTEROL FUMARATE 160-4.5 MCG/ACT IN AERO: 2.0000 | INHALATION_SPRAY | Freq: Two times a day (BID) | RESPIRATORY_TRACT | 12 refills | Status: DC

## 2016-09-01 MED ORDER — FERROUS SULFATE 325 (65 FE) MG PO TABS: 325.0000 mg | ORAL_TABLET | Freq: Every day | ORAL | 3 refills | Status: AC

## 2016-09-01 MED ORDER — IPRATROPIUM-ALBUTEROL 0.5-2.5 (3) MG/3ML IN SOLN
3.0000 mL | Freq: Three times a day (TID) | RESPIRATORY_TRACT | Status: DC
  Filled 2016-09-01: qty 3
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
## 2017-08-07 ENCOUNTER — Inpatient Hospital Stay (HOSPITAL_COMMUNITY)
Admission: EM | Admit: 2017-08-07 | Discharge: 2017-08-15 | DRG: 190 | Disposition: A | Discharge status: Home / Self Care | Payer: Medicaid Other | Attending: Family Medicine | Admitting: Family Medicine

## 2017-08-07 ENCOUNTER — Encounter (HOSPITAL_COMMUNITY): Payer: Self-pay | Admitting: Emergency Medicine

## 2017-08-07 ENCOUNTER — Emergency Department (HOSPITAL_COMMUNITY): Payer: Medicaid Other

## 2017-08-07 DIAGNOSIS — D72829 Elevated white blood cell count, unspecified: Secondary | ICD-10-CM | POA: Diagnosis not present

## 2017-08-07 DIAGNOSIS — R0902 Hypoxemia: Secondary | ICD-10-CM

## 2017-08-07 DIAGNOSIS — J9622 Acute and chronic respiratory failure with hypercapnia: Secondary | ICD-10-CM | POA: Diagnosis present

## 2017-08-07 DIAGNOSIS — D509 Iron deficiency anemia, unspecified: Secondary | ICD-10-CM | POA: Diagnosis present

## 2017-08-07 DIAGNOSIS — F419 Anxiety disorder, unspecified: Secondary | ICD-10-CM | POA: Diagnosis present

## 2017-08-07 DIAGNOSIS — J9621 Acute and chronic respiratory failure with hypoxia: Secondary | ICD-10-CM | POA: Diagnosis present

## 2017-08-07 DIAGNOSIS — F32A Depression, unspecified: Secondary | ICD-10-CM | POA: Diagnosis present

## 2017-08-07 DIAGNOSIS — F410 Panic disorder [episodic paroxysmal anxiety] without agoraphobia: Secondary | ICD-10-CM | POA: Diagnosis present

## 2017-08-07 DIAGNOSIS — E440 Moderate protein-calorie malnutrition: Secondary | ICD-10-CM | POA: Diagnosis present

## 2017-08-07 DIAGNOSIS — Z79899 Other long term (current) drug therapy: Secondary | ICD-10-CM

## 2017-08-07 DIAGNOSIS — Z87891 Personal history of nicotine dependence: Secondary | ICD-10-CM

## 2017-08-07 DIAGNOSIS — J440 Chronic obstructive pulmonary disease with acute lower respiratory infection: Secondary | ICD-10-CM | POA: Diagnosis present

## 2017-08-07 DIAGNOSIS — Z6820 Body mass index (BMI) 20.0-20.9, adult: Secondary | ICD-10-CM

## 2017-08-07 DIAGNOSIS — R0603 Acute respiratory distress: Secondary | ICD-10-CM

## 2017-08-07 DIAGNOSIS — G9341 Metabolic encephalopathy: Secondary | ICD-10-CM | POA: Diagnosis present

## 2017-08-07 DIAGNOSIS — I10 Essential (primary) hypertension: Secondary | ICD-10-CM | POA: Diagnosis present

## 2017-08-07 DIAGNOSIS — J159 Unspecified bacterial pneumonia: Secondary | ICD-10-CM | POA: Diagnosis present

## 2017-08-07 DIAGNOSIS — J441 Chronic obstructive pulmonary disease with (acute) exacerbation: Principal | ICD-10-CM | POA: Diagnosis present

## 2017-08-07 DIAGNOSIS — Z7951 Long term (current) use of inhaled steroids: Secondary | ICD-10-CM

## 2017-08-07 DIAGNOSIS — E875 Hyperkalemia: Secondary | ICD-10-CM | POA: Diagnosis not present

## 2017-08-07 DIAGNOSIS — F329 Major depressive disorder, single episode, unspecified: Secondary | ICD-10-CM | POA: Diagnosis present

## 2017-08-07 DIAGNOSIS — Z9851 Tubal ligation status: Secondary | ICD-10-CM

## 2017-08-07 DIAGNOSIS — Z90710 Acquired absence of both cervix and uterus: Secondary | ICD-10-CM

## 2017-08-07 DIAGNOSIS — T380X5A Adverse effect of glucocorticoids and synthetic analogues, initial encounter: Secondary | ICD-10-CM | POA: Diagnosis not present

## 2017-08-07 DIAGNOSIS — J45901 Unspecified asthma with (acute) exacerbation: Secondary | ICD-10-CM | POA: Diagnosis present

## 2017-08-07 DIAGNOSIS — B349 Viral infection, unspecified: Secondary | ICD-10-CM | POA: Diagnosis present

## 2017-08-07 DIAGNOSIS — E43 Unspecified severe protein-calorie malnutrition: Secondary | ICD-10-CM | POA: Diagnosis present

## 2017-08-07 DIAGNOSIS — F1490 Cocaine use, unspecified, uncomplicated: Secondary | ICD-10-CM | POA: Diagnosis present

## 2017-08-07 HISTORY — DX: Chronic obstructive pulmonary disease, unspecified: J44.9

## 2017-08-07 LAB — CBC WITH DIFFERENTIAL/PLATELET
BASOS PCT: 0 %
Basophils Absolute: 0 10*3/uL (ref 0.0–0.1)
EOS ABS: 0 10*3/uL (ref 0.0–0.7)
Eosinophils Relative: 0 %
HCT: 27.2 % — ABNORMAL LOW (ref 36.0–46.0)
Hemoglobin: 7.8 g/dL — ABNORMAL LOW (ref 12.0–15.0)
Lymphocytes Relative: 6 %
Lymphs Abs: 0.6 10*3/uL — ABNORMAL LOW (ref 0.7–4.0)
MCH: 19 pg — AB (ref 26.0–34.0)
MCHC: 28.7 g/dL — AB (ref 30.0–36.0)
MCV: 66.2 fL — ABNORMAL LOW (ref 78.0–100.0)
MONO ABS: 0.8 10*3/uL (ref 0.1–1.0)
Monocytes Relative: 8 %
NEUTROS ABS: 8.9 10*3/uL — AB (ref 1.7–7.7)
NEUTROS PCT: 86 %
PLATELETS: 286 10*3/uL (ref 150–400)
RBC: 4.11 MIL/uL (ref 3.87–5.11)
RDW: 19.2 % — ABNORMAL HIGH (ref 11.5–15.5)
WBC: 10.3 10*3/uL (ref 4.0–10.5)

## 2017-08-07 LAB — COMPREHENSIVE METABOLIC PANEL
ALT: 22 U/L (ref 14–54)
ANION GAP: 11 (ref 5–15)
AST: 28 U/L (ref 15–41)
Albumin: 3.4 g/dL — ABNORMAL LOW (ref 3.5–5.0)
Alkaline Phosphatase: 68 U/L (ref 38–126)
BUN: 14 mg/dL (ref 6–20)
CHLORIDE: 106 mmol/L (ref 101–111)
CO2: 23 mmol/L (ref 22–32)
Calcium: 8.7 mg/dL — ABNORMAL LOW (ref 8.9–10.3)
Creatinine, Ser: 0.79 mg/dL (ref 0.44–1.00)
GFR calc non Af Amer: >60 mL/min (ref >60)
Glucose, Bld: 88 mg/dL (ref 65–99)
Potassium: 4 mmol/L (ref 3.5–5.1)
SODIUM: 140 mmol/L (ref 135–145)
Total Bilirubin: 0.6 mg/dL (ref 0.3–1.2)
Total Protein: 7.2 g/dL (ref 6.5–8.1)

## 2017-08-07 LAB — INFLUENZA PANEL BY PCR (TYPE A & B)
INFLBPCR: NEGATIVE
Influenza A By PCR: NEGATIVE

## 2017-08-07 LAB — I-STAT TROPONIN, ED: TROPONIN I, POC: 0.01 ng/mL (ref 0.00–0.08)

## 2017-08-07 LAB — I-STAT CG4 LACTIC ACID, ED: Lactic Acid, Venous: 1.02 mmol/L (ref 0.5–1.9)

## 2017-08-07 MED ORDER — IPRATROPIUM-ALBUTEROL 0.5-2.5 (3) MG/3ML IN SOLN: 3.0000 mL | Freq: Once | RESPIRATORY_TRACT | Status: DC

## 2017-08-07 MED ORDER — IPRATROPIUM-ALBUTEROL 0.5-2.5 (3) MG/3ML IN SOLN
3.0000 mL | Freq: Once | RESPIRATORY_TRACT | Status: AC
  Administered 2017-08-07: 3 mL via RESPIRATORY_TRACT
  Filled 2017-08-07: qty 3

## 2017-08-07 MED ORDER — MAGNESIUM SULFATE 2 GM/50ML IV SOLN
2.0000 g | Freq: Once | INTRAVENOUS | Status: AC
  Administered 2017-08-07: 2 g via INTRAVENOUS
  Filled 2017-08-07: qty 50

## 2017-08-07 NOTE — ED Notes (Signed)
BIPAP discontinued per Dr. Rush Landmarkegeler , O2 4 lpm/[place] applied , plan of care explained by RN to pt. and family.

## 2017-08-07 NOTE — ED Notes (Signed)
Dr. Rush Landmarkegeler explained tests results and plan of care to pt. and family , pt. given more crackers and soda .

## 2017-08-07 NOTE — ED Notes (Signed)
Nebulizer treatment in progress , O2 sat= 100% - respirations unlabored , Magnesium IV infusing . EDP notified that pt. would like to go home after her treatment .

## 2017-08-07 NOTE — ED Notes (Signed)
Patient given graham crackers with peanut butter and water 

## 2017-08-07 NOTE — ED Notes (Signed)
Patient refused blood tests ordered - EDP notified.

## 2017-08-07 NOTE — ED Triage Notes (Signed)
Pt BIB [facility] on CPAP, pt from home with increased shortness of breath starting this morning. Hx COPD. Initial SpO2 with EMS 90% on room air, RR 50, given 10mg  albuterol, 0.5mg  atrovent, and 125 mg solumedrol PTA. After CPAP and meds, RR 30. Denies chest pain.

## 2017-08-07 NOTE — ED Provider Notes (Signed)
[facility] EMERGENCY DEPARTMENT Provider Note   CSN: [id number] Arrival date & time: 08/07/17  1752     History   Chief Complaint Chief Complaint  Patient presents with  . Shortness of Breath    HPI Vanessa Wallace is a 62 y.o. female.  The history is provided by the patient and medical records. No language interpreter was used.  Shortness of Breath  This is a recurrent problem. The average episode lasts 3 days. The problem occurs continuously.The current episode started more than 2 days ago. The problem has been rapidly improving. Associated symptoms include rhinorrhea, cough and wheezing. Pertinent negatives include no fever, no headaches, no neck pain, no sputum production, no hemoptysis, no chest pain, no syncope, no vomiting, no abdominal pain, no rash, no leg pain and no leg swelling. It is unknown what precipitated the problem. She has tried nothing for the symptoms. The treatment provided no relief. She has had prior ED visits. Associated medical issues include asthma and COPD. Associated medical issues do not include pneumonia, CAD, heart failure or past MI.    Past Medical History:  Diagnosis Date  . Arthritis    "all over" (09/17/2013)  . Asthma   . CAP (community acquired pneumonia) 09/17/2013  . Chronic bronchitis (HCC)    "flares up w/my asthma" (09/17/2013)  . COPD (chronic obstructive pulmonary disease) (HCC)   . Hypertension   . Migraines    "weekly lately" (09/17/2013)    Patient Active Problem List   Diagnosis Date Noted  . COPD exacerbation (HCC) 08/30/2016  . COPD (chronic obstructive pulmonary disease) (HCC) 08/30/2016  . Asthma exacerbation 10/02/2014  . Renal insufficiency   . Chest wall pain   . Cough   . Cocaine use 09/22/2013  . Pneumonia 09/17/2013  . Community acquired pneumonia 09/17/2013    Past Surgical History:  Procedure Laterality Date  . ABDOMINAL HYSTERECTOMY     "partial"  . BUNIONECTOMY Bilateral    "right one  came back after OR" (09/17/2013)  . LACERATION REPAIR Left 06/1999   Repair FPL, radial digital nerve, intrinsics, left thumb./notes 06/29/1999 (09/17/2013)  . TUBAL LIGATION      OB History    No data available       Home Medications    Prior to Admission medications   Medication Sig Start Date End Date Taking? Authorizing Provider  albuterol (PROVENTIL HFA;VENTOLIN HFA) 108 (90 BASE) MCG/ACT inhaler Inhale 1-2 puffs into the lungs every 6 (six) hours as needed for wheezing or shortness of breath.     [provider]  albuterol (PROVENTIL) (2.5 MG/3ML) 0.083% nebulizer solution Take 2.5 mg by nebulization every 6 (six) hours as needed for wheezing or shortness of breath.     [provider]  budesonide-formoterol (SYMBICORT) 160-4.5 MCG/ACT inhaler Inhale 2 puffs into the lungs 2 (two) times daily. 09/01/16   Rhetta MuraSamtani, [name], MD  ferrous sulfate 325 (65 FE) MG tablet Take 1 tablet (325 mg total) by mouth daily with breakfast. 09/01/16   Rhetta MuraSamtani, [name], MD  guaiFENesin (MUCINEX) 600 MG 12 hr tablet Take 600 mg by mouth 2 (two) times daily as needed for cough or to loosen phlegm.    [provider]  lisinopril-hydrochlorothiazide (PRINZIDE,ZESTORETIC) 10-12.5 MG tablet Take 1 tablet by mouth daily.    [provider]  montelukast (SINGULAIR) 10 MG tablet Take 10 mg by mouth at bedtime.    [provider]  predniSONE (DELTASONE) 20 MG tablet Take 2 tablets (40  mg total) by mouth daily with supper. 09/01/16   [name], MD    Family History No family history on file.  Social History Social History   Tobacco Use  . Smoking status: Former Smoker    Packs/day: 0.30    Years: 40.00    Pack years: 12.00    Types: Cigarettes    Last attempt to quit: 09/18/2014    Years since quitting: 2.8  . Smokeless tobacco: Never Used  . Tobacco comment: 09/17/2013 "weaned down from 2 ppd of cigarettes"  Substance Use Topics  . Alcohol use:  No    Alcohol/week: 3.6 oz    Types: 6 Cans of beer per week    Comment: Quit 2 weeks  . Drug use: Yes    Types: Marijuana, Cocaine    Comment: 09/17/2013 " Previous history of cocaine abuse; "quit over 1 yr ago"     Allergies   Patient has no known allergies.   Review of Systems Review of Systems  Constitutional: Negative for chills, diaphoresis, fatigue and fever.  HENT: Positive for congestion and rhinorrhea.   Eyes: Negative for visual disturbance.  Respiratory: Positive for cough, chest tightness, shortness of breath and wheezing. Negative for hemoptysis, sputum production, choking and stridor.   Cardiovascular: Negative for chest pain, leg swelling and syncope.  Gastrointestinal: Negative for abdominal pain, constipation, diarrhea, nausea and vomiting.  Genitourinary: Negative for dysuria and flank pain.  Musculoskeletal: Negative for back pain, neck pain and neck stiffness.  Skin: Negative for rash and wound.  Neurological: Negative for light-headedness, numbness and headaches.  Psychiatric/Behavioral: Negative for agitation.  All other systems reviewed and are negative.    Physical Exam Updated Vital Signs BP (!) 163/89 (BP Location: Right Arm)   Pulse 95   Temp 98.7 F (37.1 C) (Axillary)   Resp (!) 36   SpO2 100%   Physical Exam  Constitutional: She is oriented to person, place, and time. She appears well-developed and well-nourished. No distress.  HENT:  Head: Normocephalic.  Mouth/Throat: Oropharynx is clear and moist. No oropharyngeal exudate.  Eyes: Conjunctivae and EOM are normal. Pupils are equal, round, and reactive to light.  Neck: Normal range of motion.  Cardiovascular: Intact distal pulses.  Pulmonary/Chest: No stridor. Tachypnea noted. She is in respiratory distress. She has wheezes. She has no rhonchi. She has no rales. She exhibits no tenderness.  Abdominal: Soft. She exhibits no distension. There is no tenderness.  Musculoskeletal: She exhibits  no edema or tenderness.  Neurological: She is oriented to person, place, and time. No sensory deficit. She exhibits normal muscle tone.  Skin: Capillary refill takes less than 2 seconds. No rash noted. She is not diaphoretic. No erythema.  Nursing note and vitals reviewed.    ED Treatments / Results  Labs (all labs ordered are listed, but only abnormal results are displayed) Labs Reviewed  CBC WITH DIFFERENTIAL/PLATELET - Abnormal; Notable for the following components:      Result Value   Hemoglobin 7.8 (*)    HCT 27.2 (*)    MCV 66.2 (*)    MCH 19.0 (*)    MCHC 28.7 (*)    RDW 19.2 (*)    Neutro Abs 8.9 (*)    Lymphs Abs 0.6 (*)    All other components within normal limits  COMPREHENSIVE METABOLIC PANEL - Abnormal; Notable for the following components:   Calcium 8.7 (*)    Albumin 3.4 (*)    All other components within normal limits  RAPID URINE DRUG SCREEN, HOSP PERFORMED - Abnormal; Notable for the following components:   Cocaine POSITIVE (*)    All other components within normal limits  MRSA PCR SCREENING  RESPIRATORY PANEL BY PCR  CULTURE, BLOOD (ROUTINE X 2)  CULTURE, BLOOD (ROUTINE X 2)  CULTURE, EXPECTORATED SPUTUM-ASSESSMENT  GRAM STAIN  INFLUENZA PANEL BY PCR (TYPE A & B)  HIV ANTIBODY (ROUTINE TESTING)  STREP PNEUMONIAE URINARY ANTIGEN  VITAMIN B12  FOLATE  IRON AND TIBC  FERRITIN  RETICULOCYTES  I-STAT CG4 LACTIC ACID, ED  I-STAT TROPONIN, ED  I-STAT VENOUS BLOOD GAS, ED  I-STAT CG4 LACTIC ACID, ED    EKG  EKG Interpretation  Date/Time:  Wednesday August 07 2017 17:57:03 EST Ventricular Rate:  95 PR Interval:    QRS Duration: 85 QT Interval:  378 QTC Calculation: 476 R Axis:   74 Text Interpretation:  Sinus rhythm Short PR interval LVH with secondary repolarization abnormality when compared to prior, no significant changes seen,  No STEMI Confirmed by Theda Belfast ([id number]) on 08/07/2017 6:29:46 PM       Radiology Ct Head Wo  Contrast  Result Date: 08/08/2017 CLINICAL DATA:  Worsening mental status with somnolence. EXAM: CT HEAD WITHOUT CONTRAST TECHNIQUE: Contiguous axial images were obtained from the base of the skull through the vertex without intravenous contrast. COMPARISON:  08/30/2016 FINDINGS: Brain: The brain shows generalized atrophy. No focal abnormality is seen affecting the brainstem or cerebellum. Within the cerebral hemispheres, there are areas of infarction in both thalami. The left is unchanged since 2018 in the right is newly seen, but favored to be nonacute. Moderate chronic small-vessel ischemic changes are present affecting the white matter. No cortical or large vessel territory infarction. No mass lesion, hemorrhage, hydrocephalus or extra-axial collection. Vascular: There is atherosclerotic calcification of the major vessels at the base of the brain. Skull: Normal Sinuses/Orbits: Clear/normal Other: None IMPRESSION: No acute finding by CT. Moderate chronic small-vessel ischemic changes affecting the cerebral hemispheric white matter. Bilateral thalamic infarctions. The left is definitely old on the right is favored to be old. Electronically Signed   By: Paulina Fusi M.D.   On: 08/08/2017 07:23   Dg Chest Portable 1 View  Result Date: 08/07/2017 CLINICAL DATA:  Pt BIB [facility] on CPAP, pt from home with increased shortness of breath starting this morning. Hx COPD. Pt currently on BIPAP machine. EXAM: PORTABLE CHEST 1 VIEW COMPARISON:  08/31/2016 FINDINGS: Atherosclerotic calcification of the aortic arch. Emphysema noted. Large lung volumes. The lungs appear otherwise clear. No blunting of the costophrenic angles. IMPRESSION: 1. Tapering of the peripheral pulmonary vasculature favors emphysema. 2.  Aortic Atherosclerosis (ICD10-I70.0). Electronically Signed   By: Gaylyn Rong M.D.   On: 08/07/2017 18:52    Procedures Procedures (including critical care time)  CRITICAL CARE Performed by: Canary Brim  Yoanna Jurczyk Total critical care time: 45 minutes Critical care time was exclusive of separately billable procedures and treating other patients. COPD exacerbation needing breathing treatments/steroids/mag, BiPAp, and continuous oxygen.  Critical care was necessary to treat or prevent imminent or life-threatening deterioration. Critical care was time spent personally by me on the following activities: development of treatment plan with patient and/or surrogate as well as nursing, discussions with consultants, evaluation of patient's response to treatment, examination of patient, obtaining history from patient or surrogate, ordering and performing treatments and interventions, ordering and review of laboratory studies, ordering and review of radiographic studies, pulse oximetry and re-evaluation of patient's condition.   Medications Ordered in  ED Medications  ipratropium (ATROVENT) nebulizer solution 0.5 mg (0.5 mg Nebulization Given 08/08/17 1126)  dextromethorphan-guaiFENesin (MUCINEX DM) 30-600 MG per 12 hr tablet 1 tablet (not administered)  azithromycin (ZITHROMAX) tablet 500 mg (500 mg Oral Given 08/08/17 0128)    Followed by  azithromycin (ZITHROMAX) tablet 250 mg (not administered)  enoxaparin (LOVENOX) injection 40 mg (40 mg Subcutaneous Given 08/08/17 1310)  ondansetron (ZOFRAN) injection 4 mg (not administered)  hydrALAZINE (APRESOLINE) injection 5 mg (5 mg Intravenous Given 08/08/17 0309)  acetaminophen (TYLENOL) tablet 650 mg (not administered)  levalbuterol (XOPENEX) nebulizer solution 1.25 mg (1.25 mg Nebulization Given 08/08/17 1126)  budesonide (PULMICORT) nebulizer solution 0.5 mg (0.5 mg Nebulization Given 08/08/17 0709)  arformoterol (BROVANA) nebulizer solution 15 mcg (15 mcg Nebulization Given 08/08/17 0755)  methylPREDNISolone sodium succinate (SOLU-MEDROL) 125 mg/2 mL injection 60 mg (60 mg Intravenous Given 08/08/17 1307)  ipratropium-albuterol (DUONEB) 0.5-2.5 (3) MG/3ML nebulizer  solution 3 mL (3 mLs Nebulization Given 08/07/17 1834)  ipratropium-albuterol (DUONEB) 0.5-2.5 (3) MG/3ML nebulizer solution 3 mL (3 mLs Nebulization Given 08/07/17 2148)  magnesium sulfate IVPB 2 g 50 mL (0 g Intravenous Stopped 08/07/17 2214)  sodium chloride 0.9 % bolus 1,000 mL (0 mLs Intravenous Stopped 08/08/17 0230)  LORazepam (ATIVAN) injection 1 mg (1 mg Intravenous Given 08/08/17 0329)  naloxone (NARCAN) injection 0.4 mg (0.4 mg Intravenous Given 08/08/17 0526)     Initial Impression / Assessment and Plan / ED Course  I have reviewed the triage vital signs and the nursing notes.  Pertinent labs & imaging results that were available during my care of the patient were reviewed by me and considered in my medical decision making (see chart for details).     Vanessa Wallace is a 62 y.o. female with a past medical history significant for asthma, COPD, hypertension, migraines, prior pneumonia who presents with chills, congestion, productive cough, and worsening shortness of breath.  Patient is accompanied by significant other who reports that over the last several days she has had some worsening shortness of breath.  Apparently today, patient had worsening shortness of breath prompting her to call for help.  Patient had significant wheezing on EMS arrival and a breathing treatment as well as Solu-Medrol.  Patient was placed on CPAP prior to arrival during transportation.  Patient was tachypneic and had an oxygen saturation in the 90% range according to nursing.  Patient denies any chest pain, palpitations, nausea, or vomiting.  She denies any urinary symptoms or GI symptoms.  Her significant other reports that he has had a cold recently with runny nose and congestion.  On exam, patient had wheezing in the lungs.  No significant crackles or rhonchi.  Chest was nontender.  Back was nontender.  No CVA tenderness.  Abdomen nontender.  Legs were not significantly edematous.  Based on symptoms and exam I  am concerned about COPD/asthma exacerbation.  Patient was given another breathing treatment.  Patient was transitioned to BiPAP on arrival to the ED.  We will attempt to wean her if she continues to appear well.  She will have workup including imaging to look for pneumonia given the productive cough and a flu test will be taken given the cold-like symptoms her chronic lung disease, and the respiratory support she is currently receiving.  Anticipate admission for COPD exacerbation versus pneumonia after workup.       Diagnostic testing results are seen above.  Flu test negative.  Troponin not elevated.  Lactic acid not elevated.  CBC shows  no leukocytosis but patient does have a slightly decreased hemoglobin from prior.  Patient denies any bleeding recently.  Metabolic panel overall reassuring.  Chest x-ray shows evidence of emphysema but no pneumonia.  Next  Continue to suspect COPD exacerbation.  Patient was given magnesium and a DuoNeb treatment.  After treatments, patient felt that she was better and wanted to be discharged however when she tried to ambulate to the bathroom, she felt lightheaded, very short of breath, and was tachypneic again.  Patient needed to be placed on 4 L which is currently on.  Patient does not take oxygen at home.    Given the continued oxygen requirement and shortness of breath when she tried to ablate, I do not feel patient is safe for discharge home in the setting of exacerbation of her COPD/asthma.  Team will be called for admission.   Final Clinical Impressions(s) / ED Diagnoses   Final diagnoses:  COPD exacerbation (HCC)  Hypoxia    Clinical Impression: 1. COPD exacerbation (HCC)   2. Hypoxia     Disposition: Admit  This note was prepared with assistance of Dragon voice recognition software. Occasional wrong-word or sound-a-like substitutions may have occurred due to the inherent limitations of voice recognition software.     Brahm Barbeau, Canary Brim,  MD 08/08/17 1355

## 2017-08-08 ENCOUNTER — Inpatient Hospital Stay (HOSPITAL_COMMUNITY): Payer: Medicaid Other

## 2017-08-08 ENCOUNTER — Encounter (HOSPITAL_COMMUNITY): Payer: Self-pay | Admitting: Internal Medicine

## 2017-08-08 DIAGNOSIS — T380X5A Adverse effect of glucocorticoids and synthetic analogues, initial encounter: Secondary | ICD-10-CM | POA: Diagnosis not present

## 2017-08-08 DIAGNOSIS — J441 Chronic obstructive pulmonary disease with (acute) exacerbation: Principal | ICD-10-CM

## 2017-08-08 DIAGNOSIS — F329 Major depressive disorder, single episode, unspecified: Secondary | ICD-10-CM | POA: Diagnosis present

## 2017-08-08 DIAGNOSIS — Z9851 Tubal ligation status: Secondary | ICD-10-CM | POA: Diagnosis not present

## 2017-08-08 DIAGNOSIS — Z7951 Long term (current) use of inhaled steroids: Secondary | ICD-10-CM | POA: Diagnosis not present

## 2017-08-08 DIAGNOSIS — E875 Hyperkalemia: Secondary | ICD-10-CM | POA: Diagnosis not present

## 2017-08-08 DIAGNOSIS — J9622 Acute and chronic respiratory failure with hypercapnia: Secondary | ICD-10-CM

## 2017-08-08 DIAGNOSIS — Z87891 Personal history of nicotine dependence: Secondary | ICD-10-CM | POA: Diagnosis not present

## 2017-08-08 DIAGNOSIS — G9341 Metabolic encephalopathy: Secondary | ICD-10-CM | POA: Diagnosis present

## 2017-08-08 DIAGNOSIS — F410 Panic disorder [episodic paroxysmal anxiety] without agoraphobia: Secondary | ICD-10-CM | POA: Diagnosis present

## 2017-08-08 DIAGNOSIS — E440 Moderate protein-calorie malnutrition: Secondary | ICD-10-CM | POA: Diagnosis not present

## 2017-08-08 DIAGNOSIS — D509 Iron deficiency anemia, unspecified: Secondary | ICD-10-CM | POA: Diagnosis present

## 2017-08-08 DIAGNOSIS — I10 Essential (primary) hypertension: Secondary | ICD-10-CM | POA: Diagnosis present

## 2017-08-08 DIAGNOSIS — R0902 Hypoxemia: Secondary | ICD-10-CM | POA: Diagnosis not present

## 2017-08-08 DIAGNOSIS — Z90710 Acquired absence of both cervix and uterus: Secondary | ICD-10-CM | POA: Diagnosis not present

## 2017-08-08 DIAGNOSIS — J4541 Moderate persistent asthma with (acute) exacerbation: Secondary | ICD-10-CM

## 2017-08-08 DIAGNOSIS — J440 Chronic obstructive pulmonary disease with acute lower respiratory infection: Secondary | ICD-10-CM | POA: Diagnosis present

## 2017-08-08 DIAGNOSIS — J9621 Acute and chronic respiratory failure with hypoxia: Secondary | ICD-10-CM

## 2017-08-08 DIAGNOSIS — F1490 Cocaine use, unspecified, uncomplicated: Secondary | ICD-10-CM | POA: Diagnosis present

## 2017-08-08 DIAGNOSIS — Z6820 Body mass index (BMI) 20.0-20.9, adult: Secondary | ICD-10-CM | POA: Diagnosis not present

## 2017-08-08 DIAGNOSIS — F32A Depression, unspecified: Secondary | ICD-10-CM | POA: Diagnosis present

## 2017-08-08 DIAGNOSIS — D72829 Elevated white blood cell count, unspecified: Secondary | ICD-10-CM | POA: Diagnosis not present

## 2017-08-08 DIAGNOSIS — J45901 Unspecified asthma with (acute) exacerbation: Secondary | ICD-10-CM | POA: Diagnosis present

## 2017-08-08 DIAGNOSIS — E43 Unspecified severe protein-calorie malnutrition: Secondary | ICD-10-CM | POA: Diagnosis present

## 2017-08-08 DIAGNOSIS — F419 Anxiety disorder, unspecified: Secondary | ICD-10-CM | POA: Diagnosis present

## 2017-08-08 DIAGNOSIS — J159 Unspecified bacterial pneumonia: Secondary | ICD-10-CM | POA: Diagnosis present

## 2017-08-08 DIAGNOSIS — Z79899 Other long term (current) drug therapy: Secondary | ICD-10-CM | POA: Diagnosis not present

## 2017-08-08 DIAGNOSIS — B349 Viral infection, unspecified: Secondary | ICD-10-CM | POA: Diagnosis present

## 2017-08-08 LAB — GLUCOSE, CAPILLARY: GLUCOSE-CAPILLARY: 191 mg/dL — AB (ref 65–99)

## 2017-08-08 LAB — I-STAT VENOUS BLOOD GAS, ED
Acid-base deficit: 2 mmol/L (ref 0.0–2.0)
BICARBONATE: 24.1 mmol/L (ref 20.0–28.0)
O2 SAT: 67 %
PCO2 VEN: 48.1 mmHg (ref 44.0–60.0)
TCO2: 26 mmol/L (ref 22–32)
pH, Ven: 7.312 (ref 7.250–7.430)
pO2, Ven: 40 mmHg (ref 32.0–45.0)

## 2017-08-08 LAB — RAPID URINE DRUG SCREEN, HOSP PERFORMED
Amphetamines: NOT DETECTED
Barbiturates: NOT DETECTED
Benzodiazepines: NOT DETECTED
Cocaine: POSITIVE — AB
OPIATES: NOT DETECTED
Tetrahydrocannabinol: NOT DETECTED

## 2017-08-08 LAB — MRSA PCR SCREENING: MRSA by PCR: NEGATIVE

## 2017-08-08 LAB — HIV ANTIBODY (ROUTINE TESTING W REFLEX): HIV SCREEN 4TH GENERATION: NONREACTIVE

## 2017-08-08 MED ORDER — BUDESONIDE 0.5 MG/2ML IN SUSP
0.5000 mg | Freq: Two times a day (BID) | RESPIRATORY_TRACT | Status: DC
Start: 2017-08-08 — End: 2017-08-15
  Administered 2017-08-08 – 2017-08-15 (×15): 0.5 mg via RESPIRATORY_TRACT
  Filled 2017-08-08 (×15): qty 2

## 2017-08-08 MED ORDER — METHYLPREDNISOLONE SODIUM SUCC 125 MG IJ SOLR: 60.0000 mg | Freq: Three times a day (TID) | INTRAMUSCULAR | Status: DC

## 2017-08-08 MED ORDER — IPRATROPIUM-ALBUTEROL 0.5-2.5 (3) MG/3ML IN SOLN
RESPIRATORY_TRACT | Status: AC
  Filled 2017-08-08: qty 3

## 2017-08-08 MED ORDER — SODIUM CHLORIDE 0.9 % IV BOLUS (SEPSIS)
1000.0000 mL | Freq: Once | INTRAVENOUS | Status: AC
  Administered 2017-08-08: 1000 mL via INTRAVENOUS

## 2017-08-08 MED ORDER — IPRATROPIUM BROMIDE 0.02 % IN SOLN
0.5000 mg | RESPIRATORY_TRACT | Status: DC
  Administered 2017-08-08 – 2017-08-10 (×15): 0.5 mg via RESPIRATORY_TRACT
  Filled 2017-08-08 (×16): qty 2.5

## 2017-08-08 MED ORDER — LORAZEPAM 2 MG/ML IJ SOLN
1.0000 mg | Freq: Once | INTRAMUSCULAR | Status: AC
  Administered 2017-08-08: 1 mg via INTRAVENOUS

## 2017-08-08 MED ORDER — OXYCODONE HCL 5 MG PO TABS
5.0000 mg | ORAL_TABLET | ORAL | Status: DC | PRN
  Administered 2017-08-08 – 2017-08-14 (×3): 5 mg via ORAL
  Filled 2017-08-08 (×3): qty 1

## 2017-08-08 MED ORDER — LEVALBUTEROL HCL 1.25 MG/0.5ML IN NEBU
1.2500 mg | INHALATION_SOLUTION | RESPIRATORY_TRACT | Status: DC
  Administered 2017-08-08 – 2017-08-10 (×14): 1.25 mg via RESPIRATORY_TRACT
  Filled 2017-08-08 (×16): qty 0.5

## 2017-08-08 MED ORDER — LORAZEPAM 2 MG/ML IJ SOLN
INTRAMUSCULAR | Status: AC
  Filled 2017-08-08: qty 1

## 2017-08-08 MED ORDER — ARFORMOTEROL TARTRATE 15 MCG/2ML IN NEBU
15.0000 ug | INHALATION_SOLUTION | Freq: Two times a day (BID) | RESPIRATORY_TRACT | Status: DC
  Administered 2017-08-08 – 2017-08-15 (×15): 15 ug via RESPIRATORY_TRACT
  Filled 2017-08-08 (×16): qty 2

## 2017-08-08 MED ORDER — ENSURE ENLIVE PO LIQD
237.0000 mL | Freq: Two times a day (BID) | ORAL | Status: DC
  Administered 2017-08-09 (×2): 237 mL via ORAL

## 2017-08-08 MED ORDER — NALOXONE HCL 0.4 MG/ML IJ SOLN
INTRAMUSCULAR | Status: AC
  Filled 2017-08-08: qty 1

## 2017-08-08 MED ORDER — AZITHROMYCIN 500 MG PO TABS
250.0000 mg | ORAL_TABLET | Freq: Every day | ORAL | Status: DC
  Administered 2017-08-09: 250 mg via ORAL
  Filled 2017-08-08: qty 1

## 2017-08-08 MED ORDER — ACETAMINOPHEN 325 MG PO TABS: 650.0000 mg | ORAL_TABLET | Freq: Four times a day (QID) | ORAL | Status: DC | PRN

## 2017-08-08 MED ORDER — SODIUM CHLORIDE 0.9 % IV SOLN
INTRAVENOUS | Status: DC
  Administered 2017-08-08: 02:00:00 via INTRAVENOUS

## 2017-08-08 MED ORDER — LEVALBUTEROL HCL 1.25 MG/0.5ML IN NEBU
1.2500 mg | INHALATION_SOLUTION | Freq: Four times a day (QID) | RESPIRATORY_TRACT | Status: DC
Start: 2017-08-08 — End: 2017-08-08
  Administered 2017-08-08: 1.25 mg via RESPIRATORY_TRACT
  Filled 2017-08-08 (×2): qty 0.5

## 2017-08-08 MED ORDER — LISINOPRIL 10 MG PO TABS: 5.0000 mg | ORAL_TABLET | Freq: Every day | ORAL | Status: DC

## 2017-08-08 MED ORDER — ENOXAPARIN SODIUM 40 MG/0.4ML ~~LOC~~ SOLN
40.0000 mg | Freq: Every day | SUBCUTANEOUS | Status: DC
  Administered 2017-08-08 – 2017-08-15 (×7): 40 mg via SUBCUTANEOUS
  Filled 2017-08-08 (×8): qty 0.4

## 2017-08-08 MED ORDER — ZOLPIDEM TARTRATE 5 MG PO TABS
5.0000 mg | ORAL_TABLET | Freq: Every evening | ORAL | Status: DC | PRN
Start: 2017-08-08 — End: 2017-08-08

## 2017-08-08 MED ORDER — ONDANSETRON HCL 4 MG/2ML IJ SOLN
4.0000 mg | Freq: Three times a day (TID) | INTRAMUSCULAR | Status: DC | PRN
  Administered 2017-08-12: 4 mg via INTRAVENOUS
  Filled 2017-08-08: qty 2

## 2017-08-08 MED ORDER — DM-GUAIFENESIN ER 30-600 MG PO TB12
1.0000 | ORAL_TABLET | Freq: Two times a day (BID) | ORAL | Status: DC | PRN
  Administered 2017-08-13: 1 via ORAL
  Filled 2017-08-08 (×2): qty 1

## 2017-08-08 MED ORDER — MONTELUKAST SODIUM 10 MG PO TABS: 10.0000 mg | ORAL_TABLET | Freq: Every day | ORAL | Status: DC

## 2017-08-08 MED ORDER — DULOXETINE HCL 60 MG PO CPEP
60.0000 mg | ORAL_CAPSULE | Freq: Every day | ORAL | Status: DC
  Filled 2017-08-08: qty 1

## 2017-08-08 MED ORDER — ALBUTEROL SULFATE (2.5 MG/3ML) 0.083% IN NEBU
INHALATION_SOLUTION | RESPIRATORY_TRACT | Status: AC
  Filled 2017-08-08: qty 3

## 2017-08-08 MED ORDER — TRIAMTERENE-HCTZ 37.5-25 MG PO TABS: 0.5000 | ORAL_TABLET | Freq: Every day | ORAL | Status: DC

## 2017-08-08 MED ORDER — NALOXONE HCL 0.4 MG/ML IJ SOLN
0.4000 mg | Freq: Once | INTRAMUSCULAR | Status: AC
  Administered 2017-08-08: 0.4 mg via INTRAVENOUS

## 2017-08-08 MED ORDER — HYDRALAZINE HCL 20 MG/ML IJ SOLN
5.0000 mg | INTRAMUSCULAR | Status: DC | PRN
  Administered 2017-08-08 – 2017-08-09 (×2): 5 mg via INTRAVENOUS
  Filled 2017-08-08 (×2): qty 1

## 2017-08-08 MED ORDER — METHYLPREDNISOLONE SODIUM SUCC 125 MG IJ SOLR
60.0000 mg | Freq: Four times a day (QID) | INTRAMUSCULAR | Status: DC
  Administered 2017-08-08: 60 mg via INTRAVENOUS
  Administered 2017-08-08: 06:00:00 via INTRAVENOUS
  Administered 2017-08-08 (×2): 60 mg via INTRAVENOUS
  Filled 2017-08-08 (×5): qty 2

## 2017-08-08 MED ORDER — HYDROCODONE-ACETAMINOPHEN 7.5-325 MG PO TABS: 1.0000 | ORAL_TABLET | ORAL | Status: DC | PRN

## 2017-08-08 MED ORDER — AZITHROMYCIN 250 MG PO TABS
500.0000 mg | ORAL_TABLET | Freq: Every day | ORAL | Status: AC
  Administered 2017-08-08: 500 mg via ORAL
  Filled 2017-08-08: qty 2

## 2017-08-08 NOTE — ED Notes (Signed)
Dr. Clyde LundborgNiu  notified on pt.'s hypertension .

## 2017-08-08 NOTE — ED Notes (Signed)
BIPAP reapplied by RT ordered by Dr. Clyde LundborgNiu due to sudden onset SOB /air hunger , received Ativan 1 mg IV  For comfort . O2 sat= 100%.

## 2017-08-08 NOTE — ED Notes (Signed)
Dr. Clyde LundborgNiu ( admitting MD ) notified that pt. refused blood tests ( [facility]).

## 2017-08-08 NOTE — Progress Notes (Signed)
Spoke with patient about obtaining arterial blood gas.  She continues to refuse.

## 2017-08-08 NOTE — ED Notes (Signed)
Attempted to call report

## 2017-08-08 NOTE — ED Notes (Signed)
Dr Nanavati given a copy of venous blood gas 

## 2017-08-08 NOTE — ED Notes (Signed)
CT tech notified that pt. Is ready for his CT scan .

## 2017-08-08 NOTE — Plan of Care (Signed)
Patient is progressing w/respiratory status but continues to require regular breathing treatments; continues to refuse BiPAP; remains tachypneic and dyspneic at rest w/expiratory wheezes.  Diet advanced to Heart Healthy - patient is short of breath but continues to consume supper tray.  Will continue to monitor.

## 2017-08-08 NOTE — Progress Notes (Signed)
MD notified of pt's pain score (chronic back); called back & will come to see pt. Will continue to monitor.   [place]  Audreanna Torrisi, RN

## 2017-08-08 NOTE — Progress Notes (Signed)
PULMONARY / CRITICAL CARE MEDICINE   Name: Vanessa Wallace MRN: [id number] DOB: 1956-01-18    ADMISSION DATE:  08/07/2017 CONSULTATION DATE:  08/08/17  REFERRING MD: Dr Clyde Lundborg  CHIEF COMPLAINT: COPD exacerbation  HISTORY OF PRESENT ILLNESS:   HPI obtained from medical chart review as limited information is able to be obtained from patient who is currently on BiPAP with increased work of breathing.    62yoF with hx COPD, HTN, Migraines, Anemia, Cocaine and Marijuana abuse, presents to the ER tonight with COPD exacerbation. History if per patient's friend and per ER as patient is refusing to answer my questions. Patient's friend says patient ran out of one of her inhalers, but he isn't sure which one. He says she has been c/o SOB, Wheezing, and Productive cough (yellow sputum) x 1 day (since 1/23 AM). No fever that he's aware of.   On arrival to the ER she was in respiratory distress with RR 50 and Pox 90% on RA. Patient's friend thinks she is on home O2 but isn't sure.  Started on CPAP with EMS and given nebs and solumedrol.  In ER, was switched to BiPAP with improvement in work of breathing and therefore transitioned off.  However, patient again developed increased work of breathing and placed back on BiPAP.  Patient afebrile, tachycardic, tachypneic, and hypertensive.  WBC 10.3, lactic 1.02, negative troponin, CXR without acute process, flu PCR negative.  Patient is being admitted to SDU by [facility] and requesting PCCM consult for concern of respiratory failure and need of possible intubation.  However patient continues to refuse any more lab work or ABG.    At time of my exam patient is acting like she is asleep and is refusing to answer any questions yet promptly awakens when RN extends her arm to look for a vein to draw blood. Even once awakened she is still refusing to answer any of my questions.   SUBJECTIVE:  More awake, no distress  Objective:  BP 105/60   Pulse 95   Temp 98.6 F (37 C)  (Axillary)   Resp 15   Ht 5\' 2"  (1.575 m)   Wt 113 lb 12.1 oz (51.6 kg)   SpO2 100%   BMI 20.81 kg/m   Intake/Output Summary (Last 24 hours) at 08/08/2017 0931 Last data filed at 08/08/2017 0453 Gross per 24 hour  Intake 1450 ml  Output 600 ml  Net 850 ml   General: This is a 62 year old female, she is frail, currently on BiPAP.  She is in no distress. HEENT: BiPAP mask in place some jugular venous distention appreciated Pulmonary: Some accessory muscle use noted.  Diminished throughout Cardiac: Regular rate and rhythm Abdomen: Soft nontender no organomegaly Extremities: Warm dry brisk cap refill no edema Neuro: Opens eyes to request, follows commands, interactive at times.  She is refusing lab work  CBC Recent Labs    08/07/17 1825  WBC 10.3  HGB 7.8*  HCT 27.2*  PLT 286    Coag's No results for input(s): APTT, INR in the last 72 hours.  BMET Recent Labs    08/07/17 1825  NA 140  K 4.0  CL 106  CO2 23  BUN 14  CREATININE 0.79  GLUCOSE 88    Electrolytes Recent Labs    08/07/17 1825  CALCIUM 8.7*    Sepsis Markers No results for input(s): PROCALCITON, O2SATVEN in the last 72 hours.  Invalid input(s): LACTICACIDVEN  ABG No results for input(s): PHART, PCO2ART, PO2ART in the  last 72 hours.  Liver Enzymes Recent Labs    08/07/17 1825  AST 28  ALT 22  ALKPHOS 68  BILITOT 0.6  ALBUMIN 3.4*    Cardiac Enzymes No results for input(s): TROPONINI, PROBNP in the last 72 hours.  Glucose No results for input(s): GLUCAP in the last 72 hours.  Imaging Ct Head Wo Contrast  Result Date: 08/08/2017 CLINICAL DATA:  Worsening mental status with somnolence. EXAM: CT HEAD WITHOUT CONTRAST TECHNIQUE: Contiguous axial images were obtained from the base of the skull through the vertex without intravenous contrast. COMPARISON:  08/30/2016 FINDINGS: Brain: The brain shows generalized atrophy. No focal abnormality is seen affecting the brainstem or cerebellum.  Within the cerebral hemispheres, there are areas of infarction in both thalami. The left is unchanged since 2018 in the right is newly seen, but favored to be nonacute. Moderate chronic small-vessel ischemic changes are present affecting the white matter. No cortical or large vessel territory infarction. No mass lesion, hemorrhage, hydrocephalus or extra-axial collection. Vascular: There is atherosclerotic calcification of the major vessels at the base of the brain. Skull: Normal Sinuses/Orbits: Clear/normal Other: None IMPRESSION: No acute finding by CT. Moderate chronic small-vessel ischemic changes affecting the cerebral hemispheric white matter. Bilateral thalamic infarctions. The left is definitely old on the right is favored to be old. Electronically Signed   By: Paulina FusiMark  Shogry M.D.   On: 08/08/2017 07:23   Dg Chest Portable 1 View  Result Date: 08/07/2017 CLINICAL DATA:  Pt BIB [facility] on CPAP, pt from home with increased shortness of breath starting this morning. Hx COPD. Pt currently on BIPAP machine. EXAM: PORTABLE CHEST 1 VIEW COMPARISON:  08/31/2016 FINDINGS: Atherosclerotic calcification of the aortic arch. Emphysema noted. Large lung volumes. The lungs appear otherwise clear. No blunting of the costophrenic angles. IMPRESSION: 1. Tapering of the peripheral pulmonary vasculature favors emphysema. 2.  Aortic Atherosclerosis (ICD10-I70.0). Electronically Signed   By: Gaylyn RongWalter  Liebkemann M.D.   On: 08/07/2017 18:52     SIGNIFICANT EVENTS  1/23: presented to ER in COPD exacerbation and placed on BIPAP  STUDIES:  CXR (08/07/17): Atherosclerotic calcification of the aortic arch. Emphysema noted. Large lung volumes. The lungs appear otherwise clear. No blunting of the costophrenic angles. IMPRESSION: 1. Tapering of the peripheral pulmonary vasculature favors emphysema. 2.  Aortic Atherosclerosis (ICD10-I70.0).  ASSESSMENT / PLAN: 62yoF with hx COPD, HTN, Migraines, Anemia, Cocaine and Marijuana  abuse, presents to the ER tonight with COPD exacerbation.  Acute hypercarbic respiratory failure d/t COPD exacerbation: -Appears comfortable on BiPAP no distress awakening a little more easily Plan Continue scheduled short acting beta agonist as well as Atrovent every 4 hours Continuing Solu-Medrol 60 mg every 6 hours Follow-up respiratory viral panel Continue BiPAP as needed Continue a azithromycin day 1 of 5  HTN:   will hold her home antihypertensive meds (lisinopril, triamterene-HCTZ) given her level of somnolence Plan:  PRN hydralazine for hypertension Avoid beta blockade given positive cocaine and UDS  Chronic Anemia - Hgb 7.8 down from baseline of 9.0 in 08/2016; no clinically obvious sources of blood loss; continue  Plan Continue to monitor  Acute encephalopathy; Polysubstance abuse: - only very mild acute hypercapneic respiratory failure, which would not be causing this somnolence given how mild it it.  -Now more easily awake and appropriate Plan Continue supportive care  Simonne MartinetPeter E Babcock ACNP-BC [facility] Pulmonary/Critical Care Pager # [phone] OR # [phone] if no answer  Attending Note:  62 year old female with COPD history presenting with  hypercarbic respiratory failure due to a COPD exacerbation.  On exam, she is more awake and interactive, moving all ext to command and decreased BS diffusely.  I reviewed CXR myself, hyperinflation noted.  Discussed with PCCM-NP.  Will attempt to get off BiPAP.  If successful then will consider transfer to SDU.  Continue steroids and abx.  Transfer to SDU and to [facility] with PCCM off 1/25 if off BiPAP and able to protect her airway.   The patient is critically ill with multiple organ systems failure and requires high complexity decision making for assessment and support, frequent evaluation and titration of therapies, application of advanced monitoring technologies and extensive interpretation of multiple databases.   Critical Care Time  devoted to patient care services described in this note is  40  Minutes. This time reflects time of care of this signee Dr Koren Bound. This critical care time does not reflect procedure time, or teaching time or supervisory time of PA/NP/Med student/Med Resident etc but could involve care discussion time.  Alyson Reedy, M.D. [facility] Pulmonary/Critical Care Medicine. Pager: [phone]. After hours pager: [phone].

## 2017-08-08 NOTE — ED Notes (Signed)
RT at bedside administering nebulizer treatment .  

## 2017-08-08 NOTE — ED Notes (Signed)
Dr. Jeraldine LootsHammond ( intensivist) at bedside .

## 2017-08-08 NOTE — ED Notes (Signed)
ICU MD at bedside evaluating pt.

## 2017-08-08 NOTE — ED Notes (Signed)
Critical care NP notified that pt. Has been refusing blood tests. BIPAP continues , O2 sat=100% , IV sites intact .

## 2017-08-08 NOTE — H&P (Signed)
PULMONARY / CRITICAL CARE MEDICINE   Name: Vanessa Wallace MRN: [id number] DOB: 04/12/1956    ADMISSION DATE:  08/07/2017 CONSULTATION DATE:  08/08/17  REFERRING MD: Dr Clyde Lundborg  CHIEF COMPLAINT: COPD exacerbation  HISTORY OF PRESENT ILLNESS:   HPI obtained from medical chart review as limited information is able to be obtained from patient who is currently on BiPAP with increased work of breathing.    62yoF with hx COPD, HTN, Migraines, Anemia, Cocaine and Marijuana abuse, presents to the ER tonight with COPD exacerbation. History if per patient's friend and per ER as patient is refusing to answer my questions. Patient's friend says patient ran out of one of her inhalers, but he isn't sure which one. He says she has been c/o SOB, Wheezing, and Productive cough (yellow sputum) x 1 day (since 1/23 AM). No fever that he's aware of.   On arrival to the ER she was in respiratory distress with RR 50 and Pox 90% on RA. Patient's friend thinks she is on home O2 but isn't sure.  Started on CPAP with EMS and given nebs and solumedrol.  In ER, was switched to BiPAP with improvement in work of breathing and therefore transitioned off.  However, patient again developed increased work of breathing and placed back on BiPAP.  Patient afebrile, tachycardic, tachypneic, and hypertensive.  WBC 10.3, lactic 1.02, negative troponin, CXR without acute process, flu PCR negative.  Patient is being admitted to SDU by [facility] and requesting PCCM consult for concern of respiratory failure and need of possible intubation.  However patient continues to refuse any more lab work or ABG.    At time of my exam patient is acting like she is asleep and is refusing to answer any questions yet promptly awakens when RN extends her arm to look for a vein to draw blood. Even once awakened she is still refusing to answer any of my questions.   PAST MEDICAL HISTORY :   has a past medical history of Arthritis, Asthma, CAP (community acquired  pneumonia) (09/17/2013), Chronic bronchitis (HCC), COPD (chronic obstructive pulmonary disease) (HCC), Hypertension, and Migraines.  has a past surgical history that includes Tubal ligation; Laceration repair (Left, 06/1999); Bunionectomy (Bilateral); and Abdominal hysterectomy. Prior to Admission medications   Medication Sig Start Date End Date Taking? Authorizing Provider  albuterol (PROVENTIL HFA;VENTOLIN HFA) 108 (90 BASE) MCG/ACT inhaler Inhale 1-2 puffs into the lungs every 6 (six) hours as needed for wheezing or shortness of breath.    Yes [provider]  albuterol (PROVENTIL) (2.5 MG/3ML) 0.083% nebulizer solution Take 2.5 mg by nebulization every 6 (six) hours as needed for wheezing or shortness of breath.    Yes [provider]  budesonide-formoterol (SYMBICORT) 160-4.5 MCG/ACT inhaler Inhale 2 puffs into the lungs 2 (two) times daily. 09/01/16  Yes [name], MD  DULoxetine (CYMBALTA) 60 MG capsule Take 60 mg by mouth daily. 07/22/17  Yes [provider]  HYDROcodone-acetaminophen (NORCO) 7.5-325 MG tablet Take 1 tablet by mouth every 4 (four) hours as needed for moderate pain.  07/25/17  Yes [provider]  lisinopril (PRINIVIL,ZESTRIL) 5 MG tablet Take 5 mg by mouth daily. 07/13/17  Yes [provider]  montelukast (SINGULAIR) 10 MG tablet Take 10 mg by mouth at bedtime.   Yes [provider]  triamterene-hydrochlorothiazide (MAXZIDE-25) 37.5-25 MG tablet Take 0.5 tablets by mouth daily. 07/13/17  Yes [provider]  ferrous sulfate 325 (65 FE) MG tablet Take 1 tablet (325 mg total)  by mouth daily with breakfast. Patient not taking: Reported on 08/07/2017 09/01/16   Rhetta MuraSamtani, [name], MD  predniSONE (DELTASONE) 20 MG tablet Take 2 tablets (40 mg total) by mouth daily with supper. Patient not taking: Reported on 08/07/2017 09/01/16   Rhetta MuraSamtani, [name], MD   No Known Allergies  FAMILY HISTORY:  family history  includes Brain cancer in her mother. SOCIAL HISTORY:  reports that she quit smoking about 2 years ago. Her smoking use included cigarettes. She has a 12.00 pack-year smoking history. she has never used smokeless tobacco. She reports that she uses drugs. Drugs: Marijuana and Cocaine. She reports that she does not drink alcohol.  REVIEW OF SYSTEMS:   Review of Systems  Unable to perform ROS: Other  Patient uncooperative  SUBJECTIVE:  Lying on ER stretcher on BIPAP  VITAL SIGNS: Temp:  [98.7 F (37.1 C)-99.8 F (37.7 C)] 99.8 F (37.7 C) (01/23 1920) Pulse Rate:  [88-118] 113 (01/24 0400) Resp:  [16-36] 27 (01/24 0400) BP: (121-172)/(72-117) 172/117 (01/24 0400) SpO2:  [94 %-100 %] 100 % (01/24 0400) FiO2 (%):  [40 %] 40 % (01/23 1835)  PHYSICAL EXAMINATION: General: Thin adult female, lying on ER stretcher on BIPAP Neuro: awake but keeping eyes shut, spontaneously moving all extremities, refusing to cooperate with neuro exam HEENT: OP clear, MM moist Cardiovascular: RRR no m/r/g Lungs: CTA b/l, RR 22, no accessory muscle use or respiratory distress while on BIPAP Abdomen: Soft NTND, BS+ Musculoskeletal: no LE edema  Skin: no rashes; + tattoos  Recent Labs  Lab 08/07/17 1825  NA 140  K 4.0  CL 106  CO2 23  BUN 14  CREATININE 0.79  GLUCOSE 88   Recent Labs  Lab 08/07/17 1825  HGB 7.8*  HCT 27.2*  WBC 10.3  PLT 286   Dg Chest Portable 1 View  Result Date: 08/07/2017 CLINICAL DATA:  Pt BIB [facility] on CPAP, pt from home with increased shortness of breath starting this morning. Hx COPD. Pt currently on BIPAP machine. EXAM: PORTABLE CHEST 1 VIEW COMPARISON:  08/31/2016 FINDINGS: Atherosclerotic calcification of the aortic arch. Emphysema noted. Large lung volumes. The lungs appear otherwise clear. No blunting of the costophrenic angles. IMPRESSION: 1. Tapering of the peripheral pulmonary vasculature favors emphysema. 2.  Aortic Atherosclerosis (ICD10-I70.0). Electronically  Signed   By: Gaylyn RongWalter  Liebkemann M.D.   On: 08/07/2017 18:52   SIGNIFICANT EVENTS  1/23: presented to ER in COPD exacerbation and placed on BIPAP  STUDIES:  CXR (08/07/17): Atherosclerotic calcification of the aortic arch. Emphysema noted. Large lung volumes. The lungs appear otherwise clear. No blunting of the costophrenic angles. IMPRESSION: 1. Tapering of the peripheral pulmonary vasculature favors emphysema. 2.  Aortic Atherosclerosis (ICD10-I70.0).  ASSESSMENT / PLAN: 62yoF with hx COPD, HTN, Migraines, Anemia, Cocaine and Marijuana abuse, presents to the ER tonight with COPD exacerbation.  1. COPD exacerbation: - placed on BIPAP for work-of-breathing; appears comfortable on BIPAP at this time does not need intubation at this time.  - obtained VBG which shows mild acute hypercapnea. Patient finally woke up some and reports she is on chronic narcotics at home. She was still very somnolent at this time. Gave Narcan x 1 with no response in alertness. Will admit to ICU and keep on BIPAP at this time.  - continue xopenex and atrovent nebs scheduled q4hrs - add pulmicort and brovana nebs BID since patient is on an ICS/LABA at home - s/p Solumedrol 125mg  IV (given by EMS); continue 60mg  IV q6hrs - s/p  Magnesium 2g IV - CXR on my review shows pulmonary hyperinflation from her COPD but no infiltrates - obtain sputum culture if able to to rule out acute bacterial bronchitis; agree with continuing azithro until then - obtain respiratory viral panel - with the hx of cocaine abuse, am concerned this active COPD exacerbation may be related to cocaine use; will obtain UDS  2. HTN:  - will hold her home antihypertensive meds (lisinopril, triamterene-HCTZ) given her level of somnolence - start hydralazine IV PRN  3. Chronic Anemia - Hgb 7.8 down from baseline of 9.0 in 08/2016; no clinically obvious sources of blood loss; continue to monitor  4. Acute encephalopathy; Polysubstance abuse: - only  very mild acute hypercapneic respiratory failure, which would not be causing this somnolence given how mild it it.  - polysubstance abuse at home including narcotics, but no response to Narcan x 1 - obtain Head CT   60 minutes critical care time  Milana Obey, MD Pulmonary & Critical Care Medicine Pager: [phone]

## 2017-08-08 NOTE — H&P (Signed)
History and Physical    Vanessa Wallace ZOX:[id number] DOB: Mar 09, 1956 DOA: 08/07/2017  Referring MD/NP/PA:   PCP: System, Provider Not In   Patient coming from:  The patient is coming from home.  At baseline, pt is independent for most of ADL.   Chief Complaint: Cough and shortness of breath  HPI: Vanessa Wallace is a 62 y.o. female with medical history significant of hypertension, COPD, asthma, migraine headaches, depression, iron deficiency anemia substance abuse (cocaine and marijuana), who presents with cough and SOB.  Patient states that he has worsening SOB since yesterday, which has been progressively getting worse. She has productive cough with yellow colored sputum production. Patient denies chest pain, fever or chills. No runny nose or sore throat. She can barely speak in full sentence. Patient does not have nausea, vomiting, diarrhea, abdominal pain, symptoms of UTI or unilateral weakness. Patient had oxygen desaturation to 90% on room air.   ED Course: pt was found to have WBC 10.3, lactic acid is 1.02, negative troponin, negative flu PCR, electrolytes renal function okay, temperature 99.8, tachycardia, tachypnea, x-ray showed emphysema without infiltration. Patient is admitted to SDU as inpatient. PCCM was consulted.  Review of Systems:   General: no fevers, chills, no body weight gain, has poor appetite, has fatigue HEENT: no blurry vision, hearing changes or sore throat Respiratory: has dyspnea, coughing, wheezing CV: no chest pain, no palpitations GI: no nausea, vomiting, abdominal pain, diarrhea, constipation GU: no dysuria, burning on urination, increased urinary frequency, hematuria  Ext: no leg edema Neuro: no unilateral weakness, numbness, or tingling, no vision change or hearing loss Skin: no rash, no skin tear. MSK: No muscle spasm, no deformity, no limitation of range of movement in spin Heme: No easy bruising.  Travel history: No recent long distant  travel.  Allergy: No Known Allergies  Past Medical History:  Diagnosis Date  . Arthritis    "all over" (09/17/2013)  . Asthma   . CAP (community acquired pneumonia) 09/17/2013  . Chronic bronchitis (HCC)    "flares up w/my asthma" (09/17/2013)  . COPD (chronic obstructive pulmonary disease) (HCC)   . Hypertension   . Migraines    "weekly lately" (09/17/2013)    Past Surgical History:  Procedure Laterality Date  . ABDOMINAL HYSTERECTOMY     "partial"  . BUNIONECTOMY Bilateral    "right one came back after OR" (09/17/2013)  . LACERATION REPAIR Left 06/1999   Repair FPL, radial digital nerve, intrinsics, left thumb./notes 06/29/1999 (09/17/2013)  . TUBAL LIGATION      Social History:  reports that she quit smoking about 2 years ago. Her smoking use included cigarettes. She has a 12.00 pack-year smoking history. she has never used smokeless tobacco. She reports that she uses drugs. Drugs: Marijuana and Cocaine. She reports that she does not drink alcohol.  Family History:  Family History  Problem Relation Age of Onset  . Brain cancer Mother      Prior to Admission medications   Medication Sig Start Date End Date Taking? Authorizing Provider  albuterol (PROVENTIL HFA;VENTOLIN HFA) 108 (90 BASE) MCG/ACT inhaler Inhale 1-2 puffs into the lungs every 6 (six) hours as needed for wheezing or shortness of breath.    Yes [provider]  albuterol (PROVENTIL) (2.5 MG/3ML) 0.083% nebulizer solution Take 2.5 mg by nebulization every 6 (six) hours as needed for wheezing or shortness of breath.    Yes [provider]  budesonide-formoterol (SYMBICORT) 160-4.5 MCG/ACT inhaler Inhale 2 puffs into the  lungs 2 (two) times daily. 09/01/16  Yes Rhetta MuraSamtani, [name], MD  DULoxetine (CYMBALTA) 60 MG capsule Take 60 mg by mouth daily. 07/22/17  Yes [provider]  HYDROcodone-acetaminophen (NORCO) 7.5-325 MG tablet Take 1 tablet by mouth every 4 (four) hours as needed for moderate  pain.  07/25/17  Yes [provider]  lisinopril (PRINIVIL,ZESTRIL) 5 MG tablet Take 5 mg by mouth daily. 07/13/17  Yes [provider]  montelukast (SINGULAIR) 10 MG tablet Take 10 mg by mouth at bedtime.   Yes [provider]  triamterene-hydrochlorothiazide (MAXZIDE-25) 37.5-25 MG tablet Take 0.5 tablets by mouth daily. 07/13/17  Yes [provider]  ferrous sulfate 325 (65 FE) MG tablet Take 1 tablet (325 mg total) by mouth daily with breakfast. Patient not taking: Reported on 08/07/2017 09/01/16   Rhetta MuraSamtani, [name], MD  predniSONE (DELTASONE) 20 MG tablet Take 2 tablets (40 mg total) by mouth daily with supper. Patient not taking: Reported on 08/07/2017 09/01/16   Rhetta MuraSamtani, [name], MD    Physical Exam: Vitals:   08/08/17 0350 08/08/17 0400 08/08/17 0430 08/08/17 0500  BP: (!) 161/103 (!) 172/117 123/67 118/61  Pulse: 88 (!) 113 100 (!) 104  Resp: (!) 24 (!) 27 (!) 21 (!) 23  Temp:      TempSrc:      SpO2: 100% 100% 100% 100%   General: Not in acute distress HEENT:       Eyes: PERRL, EOMI, no scleral icterus.       ENT: No discharge from the ears and nose, no pharynx injection, no tonsillar enlargement.        Neck: No JVD, no bruit, no mass felt. Heme: No neck lymph node enlargement. Cardiac: S1/S2, RRR, No murmurs, No gallops or rubs. Respiratory: Severely decreased air movement bilaterally. Has mild wheezing bilaterally.  GI: Soft, nondistended, nontender, no rebound pain, no organomegaly, BS present. GU: No hematuria Ext: No pitting leg edema bilaterally. 2+DP/PT pulse bilaterally. Musculoskeletal: No joint deformities, No joint redness or warmth, no limitation of ROM in spin. Skin: No rashes.  Neuro: Alert, oriented X3, cranial nerves II-XII grossly intact, moves all extremities normally.  Psych: Patient is not psychotic, no suicidal or hemocidal ideation.  Labs on Admission: I have personally reviewed following labs and imaging  studies  CBC: Recent Labs  Lab 08/07/17 1825  WBC 10.3  NEUTROABS 8.9*  HGB 7.8*  HCT 27.2*  MCV 66.2*  PLT 286   Basic Metabolic Panel: Recent Labs  Lab 08/07/17 1825  NA 140  K 4.0  CL 106  CO2 23  GLUCOSE 88  BUN 14  CREATININE 0.79  CALCIUM 8.7*   GFR: CrCl cannot be calculated (Unknown ideal weight.). Liver Function Tests: Recent Labs  Lab 08/07/17 1825  AST 28  ALT 22  ALKPHOS 68  BILITOT 0.6  PROT 7.2  ALBUMIN 3.4*   No results for input(s): LIPASE, AMYLASE in the last 168 hours. No results for input(s): AMMONIA in the last 168 hours. Coagulation Profile: No results for input(s): INR, PROTIME in the last 168 hours. Cardiac Enzymes: No results for input(s): CKTOTAL, CKMB, CKMBINDEX, TROPONINI in the last 168 hours. BNP (last 3 results) No results for input(s): PROBNP in the last 8760 hours. HbA1C: No results for input(s): HGBA1C in the last 72 hours. CBG: No results for input(s): GLUCAP in the last 168 hours. Lipid Profile: No results for input(s): CHOL, HDL, LDLCALC, TRIG, CHOLHDL, LDLDIRECT in the last 72 hours. Thyroid Function Tests: No results  for input(s): TSH, T4TOTAL, FREET4, T3FREE, THYROIDAB in the last 72 hours. Anemia Panel: No results for input(s): VITAMINB12, FOLATE, FERRITIN, TIBC, IRON, RETICCTPCT in the last 72 hours. Urine analysis: No results found for: COLORURINE, APPEARANCEUR, LABSPEC, PHURINE, GLUCOSEU, HGBUR, BILIRUBINUR, KETONESUR, PROTEINUR, UROBILINOGEN, NITRITE, LEUKOCYTESUR Sepsis Labs: @LABRCNTIP (procalcitonin:4,lacticidven:4) )No results found for this or any previous visit (from the past 240 hour(s)).   Radiological Exams on Admission: Dg Chest Portable 1 View  Result Date: 08/07/2017 CLINICAL DATA:  Pt BIB [facility] on CPAP, pt from home with increased shortness of breath starting this morning. Hx COPD. Pt currently on BIPAP machine. EXAM: PORTABLE CHEST 1 VIEW COMPARISON:  08/31/2016 FINDINGS: Atherosclerotic  calcification of the aortic arch. Emphysema noted. Large lung volumes. The lungs appear otherwise clear. No blunting of the costophrenic angles. IMPRESSION: 1. Tapering of the peripheral pulmonary vasculature favors emphysema. 2.  Aortic Atherosclerosis (ICD10-I70.0). Electronically Signed   By: Gaylyn Rong M.D.   On: 08/07/2017 18:52     EKG: Independently reviewed.  Sinus rhythm, QTC 476, LVH, nonspecific T-wave change.   Assessment/Plan Principal Problem:   COPD exacerbation (HCC) Active Problems:   Cocaine use   Asthma exacerbation   Protein-calorie malnutrition, moderate (HCC)   Iron deficiency anemia   Depression   Acute on chronic respiratory failure with hypoxia (HCC)  Acute on chronic respiratory failure with hypoxia due to COPD/Asthma exacerbation [facility]):  Patient was initially started with CPAP and then BiPAP, with improvement, then off BiPAP, but pt developed respiratory distress again, becomes tachypnea and restless. PCCM and was consulted.  -will admit patient to SUD -will place on tele bed for obs -Nebulizers: scheduled Atrovent and prn Xopenex Nebs -Solu-Medrol 60 mg IV tid -Z pak -Mucinex for cough  -Urine S. pneumococcal antigen -Follow up blood culture x2, sputum culture, respiratory virus panel, Flu pcr -on BiPAP  Cocaine use: -will check UDS -did counseling   Protein-calorie malnutrition, moderate (HCC): -nutrition consult  Iron deficiency anemia: -Hemoglobin dropped from 9.0 on 08/30/16-7.8 -Continue iron supplement -Check anemia panel  Depression and anxiety: Stable, no suicidal or homicidal ideations. -Continue home medications: Cymbalta and prn Ativan   DVT ppx: SQ Lovenox Code Status: Full code Family Communication: Yes, patient's  husband at bed side Disposition Plan:  Anticipate discharge back to previous home environment Consults called:  none Admission status:  Inpatient/tele      Date of Service 08/08/2017    Lorretta Harp [facility] Pager [phone]  If 7PM-7AM, please contact night-coverage [email or web address] Password [facility] 08/08/2017, 5:43 AM

## 2017-08-08 NOTE — ED Notes (Signed)
Dr. Clyde LundborgNiu ( admitting MD ) notified that pt. refused blood tests.

## 2017-08-08 NOTE — ED Notes (Signed)
Patient refused blood tests .  

## 2017-08-08 NOTE — Progress Notes (Signed)
Initial Nutrition Assessment  INTERVENTION:   Encourage intake at meals and snacks  Ensure Enlive po BID, each supplement provides 350 kcal and 20 grams of protein   NUTRITION DIAGNOSIS:   Increased nutrient needs related to chronic illness(COPD) as evidenced by estimated needs.  GOAL:   Patient will meet greater than or equal to 90% of their needs  MONITOR:   PO intake  REASON FOR ASSESSMENT:   Consult Assessment of nutrition requirement/status  ASSESSMENT:   Vanessa Wallace with PMH of HTN, COPD, asthma, depression, Fe deficiency anemia, substance abuse (cocaine and marijuana) admitted with SOB/COPD exacerbation.    Attempted to speak with Vanessa Wallace x 2 however Vanessa Wallace unavailable  Vanessa Wallace now off BiPAP.  Per MD notes Vanessa Wallace with malnutrition, however unable to determine if Vanessa Wallace meets criteria at this time.   Medications reviewed and include: solumedrol Labs reviewed    NUTRITION - FOCUSED PHYSICAL EXAM:   Unable to complete Nutrition-Focused physical exam at this time.    Diet Order:  Diet Heart Room service appropriate? Yes; Fluid consistency: Thin  EDUCATION NEEDS:   Not appropriate for education at this time  Skin:  Skin Assessment: Reviewed RN Assessment  Last BM:  unknown  Height:   Ht Readings from Last 1 Encounters:  08/08/17 5\' 2"  (1.575 m)    Weight:   Wt Readings from Last 1 Encounters:  08/08/17 113 lb 12.1 oz (51.6 kg)    Ideal Body Weight:  50 kg  BMI:  Body mass index is 20.81 kg/m.  Estimated Nutritional Needs:   Kcal:  1550-1800  Protein:  75-85 grams  Fluid:  > 1.6 L/day  Kendell BaneHeather Shynice Sigel RD, LDN, CNSC [phone] Pager [phone] After Hours Pager

## 2017-08-09 ENCOUNTER — Other Ambulatory Visit: Payer: Self-pay

## 2017-08-09 DIAGNOSIS — R0902 Hypoxemia: Secondary | ICD-10-CM

## 2017-08-09 LAB — RESPIRATORY PANEL BY PCR
Adenovirus: NOT DETECTED
BORDETELLA PERTUSSIS-RVPCR: NOT DETECTED
CORONAVIRUS HKU1-RVPPCR: NOT DETECTED
CORONAVIRUS NL63-RVPPCR: NOT DETECTED
Chlamydophila pneumoniae: NOT DETECTED
Coronavirus 229E: NOT DETECTED
Coronavirus OC43: DETECTED — AB
Influenza A: NOT DETECTED
Influenza B: NOT DETECTED
Metapneumovirus: NOT DETECTED
Mycoplasma pneumoniae: NOT DETECTED
PARAINFLUENZA VIRUS 1-RVPPCR: NOT DETECTED
PARAINFLUENZA VIRUS 2-RVPPCR: NOT DETECTED
PARAINFLUENZA VIRUS 4-RVPPCR: NOT DETECTED
Parainfluenza Virus 3: NOT DETECTED
RESPIRATORY SYNCYTIAL VIRUS-RVPPCR: NOT DETECTED
RHINOVIRUS / ENTEROVIRUS - RVPPCR: NOT DETECTED

## 2017-08-09 MED ORDER — BOOST / RESOURCE BREEZE PO LIQD CUSTOM
1.0000 | Freq: Three times a day (TID) | ORAL | Status: DC
  Administered 2017-08-10 – 2017-08-14 (×8): 1 via ORAL
  Filled 2017-08-09 (×9): qty 1

## 2017-08-09 MED ORDER — METHYLPREDNISOLONE SODIUM SUCC 125 MG IJ SOLR
60.0000 mg | Freq: Two times a day (BID) | INTRAMUSCULAR | Status: DC
  Administered 2017-08-09 – 2017-08-11 (×4): 60 mg via INTRAVENOUS
  Filled 2017-08-09 (×5): qty 2

## 2017-08-09 MED ORDER — ADULT MULTIVITAMIN W/MINERALS CH
1.0000 | ORAL_TABLET | Freq: Every day | ORAL | Status: DC
  Administered 2017-08-09 – 2017-08-15 (×7): 1 via ORAL
  Filled 2017-08-09 (×7): qty 1

## 2017-08-09 MED ORDER — LISINOPRIL 5 MG PO TABS
5.0000 mg | ORAL_TABLET | Freq: Every day | ORAL | Status: DC
  Administered 2017-08-09 – 2017-08-15 (×7): 5 mg via ORAL
  Filled 2017-08-09 (×7): qty 1

## 2017-08-09 MED ORDER — TRIAMTERENE-HCTZ 37.5-25 MG PO TABS
0.5000 | ORAL_TABLET | Freq: Every day | ORAL | Status: DC
  Administered 2017-08-09 – 2017-08-15 (×7): 0.5 via ORAL
  Filled 2017-08-09 (×7): qty 0.5

## 2017-08-09 NOTE — Progress Notes (Signed)
Nutrition Follow-up  DOCUMENTATION CODES:   Severe malnutrition in context of chronic illness  INTERVENTION:   -D/c Ensure Enlive po BID, each supplement provides 350 kcal and 20 grams of protein -Boost Breeze po TID, each supplement provides 250 kcal and 9 grams of protein -MVI daily  NUTRITION DIAGNOSIS:   Severe Malnutrition related to chronic illness(COPD) as evidenced by severe muscle depletion, severe fat depletion.  Ongoing  GOAL:   Patient will meet greater than or equal to 90% of their needs  Progressing  MONITOR:   PO intake, Supplement acceptance, Labs, Weight trends, Skin, I & O's  REASON FOR ASSESSMENT:   Consult Assessment of nutrition requirement/status  ASSESSMENT:   Pt with PMH of HTN, COPD, asthma, depression, Fe deficiency anemia, substance abuse (cocaine and marijuana) admitted with SOB/COPD exacerbation.   Spoke with pt and family members at bedside. Pt reports that she is very hungry and is constantly eating. Noted meal completion 100%. Pt consumed bowl of cereal and a personal pizza at time of visit.   Pt denies any changes in appetite ("I eat all the time- whatever I want"). She shares that she experienced about a 40# wt loss over the past 4 months, which she attributes to back issues, but was unable to elaborate further, other than she required a back brace.   Reviewed wt hx; noted UBW around 125#. Pt has experienced approximately a 13# (10.3%) wt loss over the past year. This is not significant for time frame, however, concerning given hx of substance abuse and multiple chronic illnesses.   Pt is not consuming Ensure supplements. She shares that she has not consumed milk products for the past 30 years related to poor tolerance (pt disclosed that the liquid in her cereal was actually orange juice). RD will try Boost Breeze, due to non-milky consistency.   Discussed importance of good meal and supplement intake to promote healing.   Medications  reviewed and include prednisone.   Labs reviewed: CBGS: 191.   NUTRITION - FOCUSED PHYSICAL EXAM:    Most Recent Value  Orbital Region  No depletion  Upper Arm Region  Severe depletion  Thoracic and Lumbar Region  Severe depletion  Buccal Region  No depletion  Temple Region  No depletion  Clavicle Bone Region  Severe depletion  Clavicle and Acromion Bone Region  Severe depletion  Scapular Bone Region  Severe depletion  Dorsal Hand  Severe depletion  Patellar Region  Severe depletion  Anterior Thigh Region  Severe depletion  Posterior Calf Region  Severe depletion  Edema (RD Assessment)  None  Hair  Reviewed  Eyes  Reviewed  Mouth  Reviewed  Skin  Reviewed  Nails  Reviewed       Diet Order:  Diet Heart Room service appropriate? Yes; Fluid consistency: Thin  EDUCATION NEEDS:   Education needs have been addressed  Skin:  Skin Assessment: Reviewed RN Assessment  Last BM:  08/09/17  Height:   Ht Readings from Last 1 Encounters:  08/08/17 5\' 2"  (1.575 m)    Weight:   Wt Readings from Last 1 Encounters:  08/08/17 113 lb 12.1 oz (51.6 kg)    Ideal Body Weight:  50 kg  BMI:  Body mass index is 20.81 kg/m.  Estimated Nutritional Needs:   Kcal:  1550-1800  Protein:  75-85 grams  Fluid:  > 1.6 L/day    Emanuel Dowson A. Mayford KnifeWilliams, RD, LDN, CDE Pager: [phone] After hours Pager: [phone]

## 2017-08-09 NOTE — Progress Notes (Signed)
Pt sats upper 90's at rest without O2 on; she begins to panic after several minutes of high sats and drops to lower 90's. When up walking pt's sats 89%. May have more to do with anxiety than anything else. Pt resting in bed on 1L O2 for comfort.   [place]  Halia Franey, RN

## 2017-08-09 NOTE — Progress Notes (Signed)
Patient continues to refuse BiPAP; excessive accessory muscle use at this time; tachypneic; also refusing labs to be drawn.

## 2017-08-09 NOTE — Progress Notes (Addendum)
PROGRESS NOTE [facility]   Vanessa Wallace   ZOX:[id number] DOB: [birth year]  DOA: 08/07/2017 PCP: System, Provider Not In   Brief Narrative:  Vanessa Wallace is a 62 year old female with history of COPD, hypertension, migraines, polysubstance abuse and anemia who presented to the ER complaining of shortness of breath and cough.  Upon ED evaluation she was found to be in severe respiratory distress and lethargic, patient was placed on BiPAP and due to possible need of intubation patient was admitted to the ICU for further management.  Workup chest x-ray shows hyperinflation without infiltrates, vital spinal positive for coronavirus.  Patient was successfully weaned off BiPAP.  Subjective: Patient seen and examined, she wants to go home.  Remains on nasal cannula.  Denies chest pain, shortness of breath and palpitation.  Some cough.  No acute events overnight.  Patient continues to refuse lab workup.  Assessment & Plan: Acute respiratory failure with hypoxia and hypercapnia due to COPD exacerbation due to viral infection Patient initially treated with BiPAP, weaning off and now on oxygen nasal cannula 2.5 L Patient been treated with IV Solu-Medrol every 6 hours will taper down Patient was started on azithromycin, will discontinue given viral etiology (coronavirus) Continue nebulizer as needed Wean O2 as tolerated, not O2 dependent  Check O2 with pulse ox  Transfer to MedSurg  Hypertension BP medications were held during admission due to lethargy BP slightly elevated,, patient now awake and interactive will resume home medications lisinopril and traimterene-HCTZ  Continue hydralazine as needed Avoid beta-blockers as patient is a cocaine user  Chronic anemia Hemoglobin slightly down from baseline, no overt bleeding This could be due to hemodilution Patient refusing lab drawn.  Follow-up clinically  Acute metabolic encephalopathy - resolved Due to hypercapnic respiratory  failure Treat underlying cause, continue supportive care  DVT prophylaxis: Lovenox Code Status: Full code Family Communication: None at bedside Disposition Plan: Home in a.m. if remains stable   Consultants:   P CCM  Procedures:   None  Antimicrobials: Anti-infectives (From admission, onward)   Start     Dose/Rate Route Frequency Ordered Stop   08/09/17 1000  azithromycin (ZITHROMAX) tablet 250 mg  Status:  Discontinued     250 mg Oral Daily 08/08/17 0059 08/09/17 1052   08/08/17 0115  azithromycin (ZITHROMAX) tablet 500 mg     500 mg Oral Daily 08/08/17 0059 08/08/17 0128       Objective: Vitals:   08/09/17 0455 08/09/17 0750 08/09/17 0755 08/09/17 0800  BP:    (!) 150/90  Pulse:    81  Resp:    (!) 22  Temp:    98.1 F (36.7 C)  TempSrc:    Oral  SpO2: 100% 100% 100% 100%  Weight:      Height:        Intake/Output Summary (Last 24 hours) at 08/09/2017 0836 Last data filed at 08/08/2017 2329 Gross per 24 hour  Intake 346 ml  Output 950 ml  Net -604 ml   Filed Weights   08/08/17 0700  Weight: 51.6 kg (113 lb 12.1 oz)    Examination:  General exam: Appears calm and comfortable  HEENT: AC/AT, PERRLA, OP moist and clear Respiratory system: Decreased breath sounds bilaterally, no wheezing or crackles Cardiovascular system: S1 & S2 heard, RRR. No JVD, murmurs, rubs or gallops Gastrointestinal system: Abdomen is nondistended, soft and nontender. Central nervous system: Alert and oriented. No focal neurological deficits. Extremities: No pedal edema. Symmetric, strength 5/5   Skin:  No rashes, lesions or ulcers Psychiatry: Judgement and insight appear normal. Mood & affect appropriate.    Data Reviewed: I have personally reviewed following labs and imaging studies  CBC: Recent Labs  Lab 08/07/17 1825  WBC 10.3  NEUTROABS 8.9*  HGB 7.8*  HCT 27.2*  MCV 66.2*  PLT 286   Basic Metabolic Panel: Recent Labs  Lab 08/07/17 1825  NA 140  K 4.0  CL  106  CO2 23  GLUCOSE 88  BUN 14  CREATININE 0.79  CALCIUM 8.7*   GFR: Estimated Creatinine Clearance: 59.1 mL/min (by C-G formula based on SCr of 0.79 mg/dL). Liver Function Tests: Recent Labs  Lab 08/07/17 1825  AST 28  ALT 22  ALKPHOS 68  BILITOT 0.6  PROT 7.2  ALBUMIN 3.4*   No results for input(s): LIPASE, AMYLASE in the last 168 hours. No results for input(s): AMMONIA in the last 168 hours. Coagulation Profile: No results for input(s): INR, PROTIME in the last 168 hours. Cardiac Enzymes: No results for input(s): CKTOTAL, CKMB, CKMBINDEX, TROPONINI in the last 168 hours. BNP (last 3 results) No results for input(s): PROBNP in the last 8760 hours. HbA1C: No results for input(s): HGBA1C in the last 72 hours. CBG: Recent Labs  Lab 08/08/17 1610  GLUCAP 191*   Lipid Profile: No results for input(s): CHOL, HDL, LDLCALC, TRIG, CHOLHDL, LDLDIRECT in the last 72 hours. Thyroid Function Tests: No results for input(s): TSH, T4TOTAL, FREET4, T3FREE, THYROIDAB in the last 72 hours. Anemia Panel: No results for input(s): VITAMINB12, FOLATE, FERRITIN, TIBC, IRON, RETICCTPCT in the last 72 hours. Sepsis Labs: Recent Labs  Lab 08/07/17 1841  LATICACIDVEN 1.02    Recent Results (from the past 240 hour(s))  MRSA PCR Screening     Status: None   Collection Time: 08/08/17  6:58 AM  Result Value Ref Range Status   MRSA by PCR NEGATIVE NEGATIVE Final    Comment:        The GeneXpert MRSA Assay (FDA approved for NASAL specimens only), is one component of a comprehensive MRSA colonization surveillance program. It is not intended to diagnose MRSA infection nor to guide or monitor treatment for MRSA infections.   Respiratory Panel by PCR     Status: Abnormal   Collection Time: 08/08/17  1:14 PM  Result Value Ref Range Status   Adenovirus NOT DETECTED NOT DETECTED Final   Coronavirus 229E NOT DETECTED NOT DETECTED Final   Coronavirus HKU1 NOT DETECTED NOT DETECTED Final    Coronavirus NL63 NOT DETECTED NOT DETECTED Final   Coronavirus OC43 DETECTED (A) NOT DETECTED Final   Metapneumovirus NOT DETECTED NOT DETECTED Final   Rhinovirus / Enterovirus NOT DETECTED NOT DETECTED Final   Influenza A NOT DETECTED NOT DETECTED Final   Influenza B NOT DETECTED NOT DETECTED Final   Parainfluenza Virus 1 NOT DETECTED NOT DETECTED Final   Parainfluenza Virus 2 NOT DETECTED NOT DETECTED Final   Parainfluenza Virus 3 NOT DETECTED NOT DETECTED Final   Parainfluenza Virus 4 NOT DETECTED NOT DETECTED Final   Respiratory Syncytial Virus NOT DETECTED NOT DETECTED Final   Bordetella pertussis NOT DETECTED NOT DETECTED Final   Chlamydophila pneumoniae NOT DETECTED NOT DETECTED Final   Mycoplasma pneumoniae NOT DETECTED NOT DETECTED Final      Radiology Studies: Ct Head Wo Contrast  Result Date: 08/08/2017 CLINICAL DATA:  Worsening mental status with somnolence. EXAM: CT HEAD WITHOUT CONTRAST TECHNIQUE: Contiguous axial images were obtained from the base of the skull through  the vertex without intravenous contrast. COMPARISON:  08/30/2016 FINDINGS: Brain: The brain shows generalized atrophy. No focal abnormality is seen affecting the brainstem or cerebellum. Within the cerebral hemispheres, there are areas of infarction in both thalami. The left is unchanged since 2018 in the right is newly seen, but favored to be nonacute. Moderate chronic small-vessel ischemic changes are present affecting the white matter. No cortical or large vessel territory infarction. No mass lesion, hemorrhage, hydrocephalus or extra-axial collection. Vascular: There is atherosclerotic calcification of the major vessels at the base of the brain. Skull: Normal Sinuses/Orbits: Clear/normal Other: None IMPRESSION: No acute finding by CT. Moderate chronic small-vessel ischemic changes affecting the cerebral hemispheric white matter. Bilateral thalamic infarctions. The left is definitely old on the right is favored  to be old. Electronically Signed   By: Paulina Fusi M.D.   On: 08/08/2017 07:23   Dg Chest Portable 1 View  Result Date: 08/07/2017 CLINICAL DATA:  Pt BIB [facility] on CPAP, pt from home with increased shortness of breath starting this morning. Hx COPD. Pt currently on BIPAP machine. EXAM: PORTABLE CHEST 1 VIEW COMPARISON:  08/31/2016 FINDINGS: Atherosclerotic calcification of the aortic arch. Emphysema noted. Large lung volumes. The lungs appear otherwise clear. No blunting of the costophrenic angles. IMPRESSION: 1. Tapering of the peripheral pulmonary vasculature favors emphysema. 2.  Aortic Atherosclerosis (ICD10-I70.0). Electronically Signed   By: Gaylyn Rong M.D.   On: 08/07/2017 18:52      Scheduled Meds: . arformoterol  15 mcg Nebulization BID  . azithromycin  250 mg Oral Daily  . budesonide (PULMICORT) nebulizer solution  0.5 mg Nebulization BID  . enoxaparin (LOVENOX) injection  40 mg Subcutaneous Daily  . feeding supplement (ENSURE ENLIVE)  237 mL Oral BID BM  . ipratropium  0.5 mg Nebulization Q4H  . levalbuterol  1.25 mg Nebulization Q4H  . lisinopril  5 mg Oral Daily  . methylPREDNISolone (SOLU-MEDROL) injection  60 mg Intravenous Q6H  . triamterene-hydrochlorothiazide  0.5 tablet Oral Daily   Continuous Infusions:   LOS: 1 day    Time spent: Total of 25 minutes spent with pt, greater than 50% of which was spent in discussion of  treatment, counseling and coordination of care   Latrelle Dodrill, MD Pager: Text Page via [email or web address]   If 7PM-7AM, please contact night-coverage [email or web address] 08/09/2017, 8:36 AM

## 2017-08-09 NOTE — Progress Notes (Addendum)
Pt is back on BIPAP due to increased work of breathing. RR 40-50's.  Pt became diaphoretic,SOB, unable to tolerate breathing tx from RRT because she was anxious and could not tolerate mask. Pt requested to go back on BIPAP.  Will continue to monitor.   2130 Pt on BIPAP resting comfortably.   2305 MD paged to cancel bed request to med/surg floor. Will continue to monitor.

## 2017-08-10 LAB — RETICULOCYTES
RBC.: 4.25 MIL/uL (ref 3.87–5.11)
RETIC COUNT ABSOLUTE: 38.3 10*3/uL (ref 19.0–186.0)
Retic Ct Pct: 0.9 % (ref 0.4–3.1)

## 2017-08-10 LAB — IRON AND TIBC
Iron: 13 ug/dL — ABNORMAL LOW (ref 28–170)
Saturation Ratios: 2 % — ABNORMAL LOW (ref 10.4–31.8)
TIBC: 521 ug/dL — ABNORMAL HIGH (ref 250–450)
UIBC: 508 ug/dL

## 2017-08-10 LAB — BASIC METABOLIC PANEL
ANION GAP: 12 (ref 5–15)
BUN: 20 mg/dL (ref 6–20)
CALCIUM: 9 mg/dL (ref 8.9–10.3)
CO2: 27 mmol/L (ref 22–32)
Chloride: 100 mmol/L — ABNORMAL LOW (ref 101–111)
Creatinine, Ser: 0.83 mg/dL (ref 0.44–1.00)
Glucose, Bld: 99 mg/dL (ref 65–99)
Potassium: 4.5 mmol/L (ref 3.5–5.1)
SODIUM: 139 mmol/L (ref 135–145)

## 2017-08-10 LAB — FOLATE: FOLATE: 10.5 ng/mL (ref >5.9)

## 2017-08-10 LAB — PROCALCITONIN

## 2017-08-10 LAB — CBC
HCT: 29 % — ABNORMAL LOW (ref 36.0–46.0)
Hemoglobin: 8 g/dL — ABNORMAL LOW (ref 12.0–15.0)
MCH: 18.8 pg — ABNORMAL LOW (ref 26.0–34.0)
MCHC: 27.6 g/dL — ABNORMAL LOW (ref 30.0–36.0)
MCV: 68.2 fL — ABNORMAL LOW (ref 78.0–100.0)
PLATELETS: 308 10*3/uL (ref 150–400)
RBC: 4.25 MIL/uL (ref 3.87–5.11)
RDW: 20 % — AB (ref 11.5–15.5)
WBC: 10.2 10*3/uL (ref 4.0–10.5)

## 2017-08-10 LAB — VITAMIN B12: VITAMIN B 12: 383 pg/mL (ref 180–914)

## 2017-08-10 LAB — FERRITIN: Ferritin: 8 ng/mL — ABNORMAL LOW (ref 11–307)

## 2017-08-10 MED ORDER — LEVALBUTEROL HCL 1.25 MG/0.5ML IN NEBU
1.2500 mg | INHALATION_SOLUTION | Freq: Four times a day (QID) | RESPIRATORY_TRACT | Status: DC
  Administered 2017-08-11 – 2017-08-12 (×6): 1.25 mg via RESPIRATORY_TRACT
  Filled 2017-08-10 (×8): qty 0.5

## 2017-08-10 MED ORDER — LORAZEPAM 0.5 MG PO TABS
0.5000 mg | ORAL_TABLET | Freq: Four times a day (QID) | ORAL | Status: DC | PRN
  Administered 2017-08-10 – 2017-08-11 (×3): 0.5 mg via ORAL
  Filled 2017-08-10 (×3): qty 1

## 2017-08-10 MED ORDER — LORAZEPAM 2 MG/ML IJ SOLN: 0.5000 mg | Freq: Four times a day (QID) | INTRAMUSCULAR | Status: DC | PRN

## 2017-08-10 MED ORDER — DULOXETINE HCL 60 MG PO CPEP
60.0000 mg | ORAL_CAPSULE | Freq: Every day | ORAL | Status: DC
  Administered 2017-08-10 – 2017-08-15 (×6): 60 mg via ORAL
  Filled 2017-08-10 (×6): qty 1

## 2017-08-10 MED ORDER — ACETAMINOPHEN 500 MG PO TABS: 1000.0000 mg | ORAL_TABLET | Freq: Four times a day (QID) | ORAL | Status: DC | PRN

## 2017-08-10 MED ORDER — IPRATROPIUM BROMIDE 0.02 % IN SOLN
0.5000 mg | Freq: Four times a day (QID) | RESPIRATORY_TRACT | Status: DC
  Administered 2017-08-11 – 2017-08-12 (×6): 0.5 mg via RESPIRATORY_TRACT
  Filled 2017-08-10 (×5): qty 2.5

## 2017-08-10 NOTE — Progress Notes (Signed)
PROGRESS NOTE [facility]   Vanessa Wallace   RUE:[id number] DOB: 30-Mar-1956  DOA: 08/07/2017 PCP: System, Provider Not In   Brief Narrative:  Vanessa Wallace is a 62 year old female with history of COPD, hypertension, migraines, polysubstance abuse and anemia who presented to the ER complaining of shortness of breath and cough.  Upon ED evaluation she was found to be in severe respiratory distress and lethargic, patient was placed on BiPAP and due to possible need of intubation patient was admitted to the ICU for further management.  Workup chest x-ray shows hyperinflation without infiltrates, vital spinal positive for coronavirus.  Patient was successfully weaned off BiPAP.  Subjective: Overnight had increase work of breathing, placed back on BiPAP. Patient report feeling very anxious and having panic attacks. Saturation remains above 91%.   Assessment & Plan: Acute respiratory failure with hypoxia and hypercapnia due to COPD exacerbation due to viral infection Patient initially treated with BiPAP, weaning off and now on oxygen nasal cannula 2.5 L Patient been treated with IV Solu-Medrol continue q 12 hrs for now  Patient was started on azithromycin, will discontinue given viral etiology (coronavirus) Continue nebulizer as needed Wean off BiPAP as tolerated - transition to [place] keep O2 sta between 89-92%   Tachypnea likely from anxiety, patient was not on her anti-anxiety medication  Will add low dose ativan  Keep in SDU for now   Hypertension BP improved with the addition of home medications  Continue current regimen  Avoid beta-blockers as patient is a cocaine user  Chronic anemia Hemoglobin slightly down from baseline, no overt bleeding This could be due to hemodilution FOBT, check labs patient agrees for lab work up  If Hgb less 7 will transfuse   Acute metabolic encephalopathy - resolved Due to hypercapnic respiratory failure Treat underlying cause, continue  supportive care  DVT prophylaxis: Lovenox Code Status: Full code Family Communication: None at bedside Disposition Plan: Hopefully home in 1-2 days    Consultants:   PCCM  Procedures:   None  Antimicrobials: Anti-infectives (From admission, onward)   Start     Dose/Rate Route Frequency Ordered Stop   08/09/17 1000  azithromycin (ZITHROMAX) tablet 250 mg  Status:  Discontinued     250 mg Oral Daily 08/08/17 0059 08/09/17 1052   08/08/17 0115  azithromycin (ZITHROMAX) tablet 500 mg     500 mg Oral Daily 08/08/17 0059 08/08/17 0128      Objective: Vitals:   08/10/17 0715 08/10/17 0720 08/10/17 0722 08/10/17 0751  BP:    (!) 145/98  Pulse:  86  (!) 104  Resp:  17  19  Temp:      TempSrc:      SpO2: 100% 100% 100% 100%  Weight:      Height:        Intake/Output Summary (Last 24 hours) at 08/10/2017 0837 Last data filed at 08/10/2017 0400 Gross per 24 hour  Intake 840 ml  Output -  Net 840 ml   Filed Weights   08/08/17 0700  Weight: 51.6 kg (113 lb 12.1 oz)    Examination:  General: NAD, on BiPAP  Cardiovascular: RRR, S1/S2 +, no rubs, no gallops Respiratory: Tachypnea, good air entry, mild rhonchi at the bases, no wheezing or crackles  Abdominal: Soft, NT, ND, bowel sounds + Extremities: no edema, no cyanosis   Data Reviewed: I have personally reviewed following labs and imaging studies  CBC: Recent Labs  Lab 08/07/17 1825  WBC 10.3  NEUTROABS 8.9*  HGB 7.8*  HCT 27.2*  MCV 66.2*  PLT 286   Basic Metabolic Panel: Recent Labs  Lab 08/07/17 1825  NA 140  K 4.0  CL 106  CO2 23  GLUCOSE 88  BUN 14  CREATININE 0.79  CALCIUM 8.7*   GFR: Estimated Creatinine Clearance: 59.1 mL/min (by C-G formula based on SCr of 0.79 mg/dL). Liver Function Tests: Recent Labs  Lab 08/07/17 1825  AST 28  ALT 22  ALKPHOS 68  BILITOT 0.6  PROT 7.2  ALBUMIN 3.4*   No results for input(s): LIPASE, AMYLASE in the last 168 hours. No results for input(s):  AMMONIA in the last 168 hours. Coagulation Profile: No results for input(s): INR, PROTIME in the last 168 hours. Cardiac Enzymes: No results for input(s): CKTOTAL, CKMB, CKMBINDEX, TROPONINI in the last 168 hours. BNP (last 3 results) No results for input(s): PROBNP in the last 8760 hours. HbA1C: No results for input(s): HGBA1C in the last 72 hours. CBG: Recent Labs  Lab 08/08/17 1610  GLUCAP 191*   Lipid Profile: No results for input(s): CHOL, HDL, LDLCALC, TRIG, CHOLHDL, LDLDIRECT in the last 72 hours. Thyroid Function Tests: No results for input(s): TSH, T4TOTAL, FREET4, T3FREE, THYROIDAB in the last 72 hours. Anemia Panel: No results for input(s): VITAMINB12, FOLATE, FERRITIN, TIBC, IRON, RETICCTPCT in the last 72 hours. Sepsis Labs: Recent Labs  Lab 08/07/17 1841  LATICACIDVEN 1.02    Recent Results (from the past 240 hour(s))  Culture, blood (routine x 2) Call MD if unable to obtain prior to antibiotics being given     Status: None (Preliminary result)   Collection Time: 08/08/17  5:00 AM  Result Value Ref Range Status   Specimen Description BLOOD RIGHT HAND  Final   Special Requests   Final    BOTTLES DRAWN AEROBIC AND ANAEROBIC Blood Culture adequate volume   Culture NO GROWTH 1 DAY  Final   Report Status PENDING  Incomplete  Culture, blood (routine x 2) Call MD if unable to obtain prior to antibiotics being given     Status: None (Preliminary result)   Collection Time: 08/08/17  5:10 AM  Result Value Ref Range Status   Specimen Description BLOOD RIGHT HAND  Final   Special Requests IN PEDIATRIC BOTTLE Blood Culture adequate volume  Final   Culture NO GROWTH 1 DAY  Final   Report Status PENDING  Incomplete  MRSA PCR Screening     Status: None   Collection Time: 08/08/17  6:58 AM  Result Value Ref Range Status   MRSA by PCR NEGATIVE NEGATIVE Final    Comment:        The GeneXpert MRSA Assay (FDA approved for NASAL specimens only), is one component of  a comprehensive MRSA colonization surveillance program. It is not intended to diagnose MRSA infection nor to guide or monitor treatment for MRSA infections.   Respiratory Panel by PCR     Status: Abnormal   Collection Time: 08/08/17  1:14 PM  Result Value Ref Range Status   Adenovirus NOT DETECTED NOT DETECTED Final   Coronavirus 229E NOT DETECTED NOT DETECTED Final   Coronavirus HKU1 NOT DETECTED NOT DETECTED Final   Coronavirus NL63 NOT DETECTED NOT DETECTED Final   Coronavirus OC43 DETECTED (A) NOT DETECTED Final   Metapneumovirus NOT DETECTED NOT DETECTED Final   Rhinovirus / Enterovirus NOT DETECTED NOT DETECTED Final   Influenza A NOT DETECTED NOT DETECTED Final   Influenza B NOT DETECTED NOT DETECTED Final  Parainfluenza Virus 1 NOT DETECTED NOT DETECTED Final   Parainfluenza Virus 2 NOT DETECTED NOT DETECTED Final   Parainfluenza Virus 3 NOT DETECTED NOT DETECTED Final   Parainfluenza Virus 4 NOT DETECTED NOT DETECTED Final   Respiratory Syncytial Virus NOT DETECTED NOT DETECTED Final   Bordetella pertussis NOT DETECTED NOT DETECTED Final   Chlamydophila pneumoniae NOT DETECTED NOT DETECTED Final   Mycoplasma pneumoniae NOT DETECTED NOT DETECTED Final      Radiology Studies: No results found.    Scheduled Meds: . arformoterol  15 mcg Nebulization BID  . budesonide (PULMICORT) nebulizer solution  0.5 mg Nebulization BID  . enoxaparin (LOVENOX) injection  40 mg Subcutaneous Daily  . feeding supplement  1 Container Oral TID BM  . ipratropium  0.5 mg Nebulization Q4H  . levalbuterol  1.25 mg Nebulization Q4H  . lisinopril  5 mg Oral Daily  . methylPREDNISolone (SOLU-MEDROL) injection  60 mg Intravenous Q12H  . multivitamin with minerals  1 tablet Oral Daily  . triamterene-hydrochlorothiazide  0.5 tablet Oral Daily   Continuous Infusions:   LOS: 2 days    Time spent: Total of 25 minutes spent with pt, greater than 50% of which was spent in discussion of   treatment, counseling and coordination of care   Latrelle Dodrill, MD Pager: Text Page via [email or web address]   If 7PM-7AM, please contact night-coverage [email or web address] 08/10/2017, 8:37 AM

## 2017-08-11 ENCOUNTER — Inpatient Hospital Stay (HOSPITAL_COMMUNITY): Payer: Medicaid Other

## 2017-08-11 MED ORDER — LORAZEPAM 0.5 MG PO TABS
0.5000 mg | ORAL_TABLET | Freq: Four times a day (QID) | ORAL | Status: DC | PRN
  Administered 2017-08-13 – 2017-08-14 (×3): 0.5 mg via ORAL
  Filled 2017-08-11 (×3): qty 1

## 2017-08-11 MED ORDER — LORAZEPAM 2 MG/ML IJ SOLN: 0.5000 mg | Freq: Once | INTRAMUSCULAR | Status: AC

## 2017-08-11 MED ORDER — LORAZEPAM 2 MG/ML IJ SOLN
INTRAMUSCULAR | Status: AC
  Administered 2017-08-11: 0.5 mg
  Filled 2017-08-11: qty 1

## 2017-08-11 MED ORDER — SODIUM CHLORIDE 0.9 % IV SOLN
125.0000 mg | Freq: Once | INTRAVENOUS | Status: AC
  Administered 2017-08-11: 125 mg via INTRAVENOUS
  Filled 2017-08-11: qty 10

## 2017-08-11 MED ORDER — SODIUM CHLORIDE 0.9 % IV SOLN: 25.0000 mg | Freq: Once | INTRAVENOUS | Status: DC

## 2017-08-11 MED ORDER — METHYLPREDNISOLONE SODIUM SUCC 125 MG IJ SOLR
60.0000 mg | Freq: Two times a day (BID) | INTRAMUSCULAR | Status: AC
  Administered 2017-08-11: 60 mg via INTRAVENOUS

## 2017-08-11 MED ORDER — PREDNISONE 50 MG PO TABS: 60.0000 mg | ORAL_TABLET | Freq: Every day | ORAL | Status: DC

## 2017-08-11 MED ORDER — LORAZEPAM 2 MG/ML IJ SOLN: 0.5000 mg | Freq: Four times a day (QID) | INTRAMUSCULAR | Status: DC | PRN

## 2017-08-11 NOTE — Progress Notes (Signed)
PROGRESS NOTE [facility]   LAM BJORKLUND   ZOX:[id number] DOB: [birth year]  DOA: 08/07/2017 PCP: System, Provider Not In   Brief Narrative:  Vanessa Wallace is a 62 year old female with history of COPD, hypertension, migraines, polysubstance abuse and anemia who presented to the ER complaining of shortness of breath and cough.  Upon ED evaluation she was found to be in severe respiratory distress and lethargic, patient was placed on BiPAP and due to possible need of intubation patient was admitted to the ICU for further management.  Workup chest x-ray shows hyperinflation without infiltrates, vital spinal positive for coronavirus.  Patient was successfully weaned off BiPAP.  Subjective: Patient seen and examined, she is off BiPAP, although had an panic attack and was placed on BiPAP again. Patient saturation was 89.   Assessment & Plan: Acute respiratory failure with hypoxia and hypercapnia due to COPD exacerbation due to viral infection Patient initially treated with BiPAP, successfully weaned off Taper solumedrol - transition to Prednisone in AM  Patient was started on azithromycin, will discontinue given viral etiology (coronavirus) Continue nebulizer as needed Will d/c BiPAP, this is not indicated for panic attacks, O2 saturation ok between 88-92% on COPD patient. Tachypnea is from anxiety continue low dose ativan she may benefit from Klonipin will continue to monitor.   Transfer to Medsurg   Hypertension BP stable  Continue current regimen  Avoid beta-blockers as patient is a cocaine user  Chronic anemia - Iron deficiency  Hgb stable  Give IV iron  Will add iron supplementation upon d/c  If Hgb less 7 will transfuse  Monitor CBC in AM   Acute metabolic encephalopathy - resolved Due to hypercapnic respiratory failure Treat underlying cause, continue supportive care  DVT prophylaxis: Lovenox Code Status: Full code Family Communication: None at bedside Disposition  Plan: Home in AM    Consultants:   PCCM  Procedures:   None  Antimicrobials: Anti-infectives (From admission, onward)   Start     Dose/Rate Route Frequency Ordered Stop   08/09/17 1000  azithromycin (ZITHROMAX) tablet 250 mg  Status:  Discontinued     250 mg Oral Daily 08/08/17 0059 08/09/17 1052   08/08/17 0115  azithromycin (ZITHROMAX) tablet 500 mg     500 mg Oral Daily 08/08/17 0059 08/08/17 0128      Objective: Vitals:   08/11/17 1304 08/11/17 1308 08/11/17 1336 08/11/17 1600  BP:  (!) 165/94    Pulse:  94 (!) 105 97  Resp:  (!) 21 (!) 21 19  Temp:  98.9 F (37.2 C)    TempSrc:  Axillary    SpO2: 100% 100% 99% 94%  Weight:      Height:        Intake/Output Summary (Last 24 hours) at 08/11/2017 1721 Last data filed at 08/11/2017 0300 Gross per 24 hour  Intake 240 ml  Output 375 ml  Net -135 ml   Filed Weights   08/08/17 0700  Weight: 51.6 kg (113 lb 12.1 oz)    Examination:  General: NAD  Cardiovascular: RRR, S1/S2 +, no rubs, no gallops Respiratory: Good air entry, mild rhonchi at bases, significantly improved. No wheezing  Abdominal: Soft, NT, ND, bowel sounds  Extremities: no edema, no cyanosis  Data Reviewed: I have personally reviewed following labs and imaging studies  CBC: Recent Labs  Lab 08/07/17 1825 08/10/17 0845  WBC 10.3 10.2  NEUTROABS 8.9*  --   HGB 7.8* 8.0*  HCT 27.2* 29.0*  MCV 66.2* 68.2*  PLT 286 308   Basic Metabolic Panel: Recent Labs  Lab 08/07/17 1825 08/10/17 0845  NA 140 139  K 4.0 4.5  CL 106 100*  CO2 23 27  GLUCOSE 88 99  BUN 14 20  CREATININE 0.79 0.83  CALCIUM 8.7* 9.0   GFR: Estimated Creatinine Clearance: 57 mL/min (by C-G formula based on SCr of 0.83 mg/dL). Liver Function Tests: Recent Labs  Lab 08/07/17 1825  AST 28  ALT 22  ALKPHOS 68  BILITOT 0.6  PROT 7.2  ALBUMIN 3.4*   No results for input(s): LIPASE, AMYLASE in the last 168 hours. No results for input(s): AMMONIA in the last  168 hours. Coagulation Profile: No results for input(s): INR, PROTIME in the last 168 hours. Cardiac Enzymes: No results for input(s): CKTOTAL, CKMB, CKMBINDEX, TROPONINI in the last 168 hours. BNP (last 3 results) No results for input(s): PROBNP in the last 8760 hours. HbA1C: No results for input(s): HGBA1C in the last 72 hours. CBG: Recent Labs  Lab 08/08/17 1610  GLUCAP 191*   Lipid Profile: No results for input(s): CHOL, HDL, LDLCALC, TRIG, CHOLHDL, LDLDIRECT in the last 72 hours. Thyroid Function Tests: No results for input(s): TSH, T4TOTAL, FREET4, T3FREE, THYROIDAB in the last 72 hours. Anemia Panel: Recent Labs    08/10/17 0845  VITAMINB12 383  FOLATE 10.5  FERRITIN 8*  TIBC 521*  IRON 13*  RETICCTPCT 0.9   Sepsis Labs: Recent Labs  Lab 08/07/17 1841 08/10/17 0845  PROCALCITON  --  <0.10  LATICACIDVEN 1.02  --     Recent Results (from the past 240 hour(s))  Culture, blood (routine x 2) Call MD if unable to obtain prior to antibiotics being given     Status: None (Preliminary result)   Collection Time: 08/08/17  5:00 AM  Result Value Ref Range Status   Specimen Description BLOOD RIGHT HAND  Final   Special Requests   Final    BOTTLES DRAWN AEROBIC AND ANAEROBIC Blood Culture adequate volume   Culture NO GROWTH 3 DAYS  Final   Report Status PENDING  Incomplete  Culture, blood (routine x 2) Call MD if unable to obtain prior to antibiotics being given     Status: None (Preliminary result)   Collection Time: 08/08/17  5:10 AM  Result Value Ref Range Status   Specimen Description BLOOD RIGHT HAND  Final   Special Requests IN PEDIATRIC BOTTLE Blood Culture adequate volume  Final   Culture NO GROWTH 3 DAYS  Final   Report Status PENDING  Incomplete  MRSA PCR Screening     Status: None   Collection Time: 08/08/17  6:58 AM  Result Value Ref Range Status   MRSA by PCR NEGATIVE NEGATIVE Final    Comment:        The GeneXpert MRSA Assay (FDA approved for NASAL  specimens only), is one component of a comprehensive MRSA colonization surveillance program. It is not intended to diagnose MRSA infection nor to guide or monitor treatment for MRSA infections.   Respiratory Panel by PCR     Status: Abnormal   Collection Time: 08/08/17  1:14 PM  Result Value Ref Range Status   Adenovirus NOT DETECTED NOT DETECTED Final   Coronavirus 229E NOT DETECTED NOT DETECTED Final   Coronavirus HKU1 NOT DETECTED NOT DETECTED Final   Coronavirus NL63 NOT DETECTED NOT DETECTED Final   Coronavirus OC43 DETECTED (A) NOT DETECTED Final   Metapneumovirus NOT DETECTED NOT DETECTED Final   Rhinovirus / Enterovirus  NOT DETECTED NOT DETECTED Final   Influenza A NOT DETECTED NOT DETECTED Final   Influenza B NOT DETECTED NOT DETECTED Final   Parainfluenza Virus 1 NOT DETECTED NOT DETECTED Final   Parainfluenza Virus 2 NOT DETECTED NOT DETECTED Final   Parainfluenza Virus 3 NOT DETECTED NOT DETECTED Final   Parainfluenza Virus 4 NOT DETECTED NOT DETECTED Final   Respiratory Syncytial Virus NOT DETECTED NOT DETECTED Final   Bordetella pertussis NOT DETECTED NOT DETECTED Final   Chlamydophila pneumoniae NOT DETECTED NOT DETECTED Final   Mycoplasma pneumoniae NOT DETECTED NOT DETECTED Final      Radiology Studies: No results found.    Scheduled Meds: . arformoterol  15 mcg Nebulization BID  . budesonide (PULMICORT) nebulizer solution  0.5 mg Nebulization BID  . DULoxetine  60 mg Oral Daily  . enoxaparin (LOVENOX) injection  40 mg Subcutaneous Daily  . feeding supplement  1 Container Oral TID BM  . ipratropium  0.5 mg Nebulization Q6H  . levalbuterol  1.25 mg Nebulization Q6H  . lisinopril  5 mg Oral Daily  . methylPREDNISolone (SOLU-MEDROL) injection  60 mg Intravenous Q12H  . multivitamin with minerals  1 tablet Oral Daily  . triamterene-hydrochlorothiazide  0.5 tablet Oral Daily   Continuous Infusions:   LOS: 3 days    Time spent: Total of 25 minutes  spent with pt, greater than 50% of which was spent in discussion of  treatment, counseling and coordination of care   Latrelle Dodrill, MD Pager: Text Page via [email or web address]   If 7PM-7AM, please contact night-coverage [email or web address] 08/11/2017, 5:21 PM

## 2017-08-11 NOTE — Progress Notes (Signed)
RT came to patient room to deliver patient nebulizer treatments. Patient is back on the bipap. She states "I went to the bathroom by myself and got anxious and I started hyperventilating". RT will talk to nurse about anxiety medication as patient does not appear to be having respiratory issues but anxiety issues. Vitals are stable and sats are 100%. RT will continue to monitor.

## 2017-08-11 NOTE — Progress Notes (Signed)
During shift change RN was informed during handoff that patient's labored breathing is related to panic and anxiety attacks which was being treated with Ativan. Patient at the time was asleep with some labored breathing in her sleep however, RN was informed that this is the patients baseline. Patient was not in distress while asleep.Patient was transferred from [facility] to 5N prior to shift change. RT informed RN that when she went to administer nebulizer treatments that the patients breathing was significantly labored and that she called [facility]. [facility] came to the bedside at 5N and the patient was transferred back to [facility].

## 2017-08-11 NOTE — Significant Event (Addendum)
Rapid Response Event Note  Overview: Called by RT about patient being in respiratory distress  Initial Focused Assessment: Upon arrival, patient was labored and in distress, wheezing throughout, RT administered scheduled nebs and gave a xopenox treatment as well and symptoms did not improved, patient's anxious, retracting, and diaphoretic. Sats 93% on 2L, after treatments patient was placed on NRB 15L.  Hypertensive 179/111 (120), HR 99, and RR 28-32 range. Ativan 0.5mg  IV was given and anxiety did not improved and patient still had increased WOB and SOB.   Interventions: -- Ativan 0.5mg  IV -- CXR -- ABG - PCO2 59.8 and PAO2 65.8 -- BIPAP - placed on arrival to SDU 10/5 tolerating well  -- Transfer to SDU -- Ativan orders switched to IV for tonight while on BIPAP  Plan of Care (if not transferred): -- Tx to 2C10  Event Summary: Call Time 2108 Arrival Time 2110 End Time 2210  Minnie Shi R

## 2017-08-12 ENCOUNTER — Encounter (HOSPITAL_COMMUNITY): Payer: Self-pay | Admitting: Radiology

## 2017-08-12 ENCOUNTER — Inpatient Hospital Stay (HOSPITAL_COMMUNITY): Payer: Medicaid Other

## 2017-08-12 DIAGNOSIS — E43 Unspecified severe protein-calorie malnutrition: Secondary | ICD-10-CM

## 2017-08-12 LAB — CBC
HCT: 30.4 % — ABNORMAL LOW (ref 36.0–46.0)
HEMOGLOBIN: 8.5 g/dL — AB (ref 12.0–15.0)
MCH: 18.7 pg — AB (ref 26.0–34.0)
MCHC: 28 g/dL — AB (ref 30.0–36.0)
MCV: 67 fL — AB (ref 78.0–100.0)
Platelets: 320 10*3/uL (ref 150–400)
RBC: 4.54 MIL/uL (ref 3.87–5.11)
RDW: 19.2 % — ABNORMAL HIGH (ref 11.5–15.5)
WBC: 15.7 10*3/uL — ABNORMAL HIGH (ref 4.0–10.5)

## 2017-08-12 LAB — BLOOD GAS, ARTERIAL
Acid-Base Excess: 7.3 mmol/L — ABNORMAL HIGH (ref 0.0–2.0)
Acid-Base Excess: 7.1 mmol/L — ABNORMAL HIGH (ref 0.0–2.0)
Bicarbonate: 32.8 mmol/L — ABNORMAL HIGH (ref 20.0–28.0)
Bicarbonate: 32 mmol/L — ABNORMAL HIGH (ref 20.0–28.0)
Drawn by: 51155
Drawn by: 313941
O2 Content: 2 L/min
O2 Content: 4 L/min
O2 Saturation: 90.8 %
O2 Saturation: 90.9 %
PCO2 ART: 52.7 mmHg — AB (ref 32.0–48.0)
PH ART: 7.398 (ref 7.350–7.450)
Patient temperature: 97.5
Patient temperature: 98
pCO2 arterial: 59.8 mmHg — ABNORMAL HIGH (ref 32.0–48.0)
pH, Arterial: 7.355 (ref 7.350–7.450)
pO2, Arterial: 65.8 mmHg — ABNORMAL LOW (ref 83.0–108.0)
pO2, Arterial: 64.3 mmHg — ABNORMAL LOW (ref 83.0–108.0)

## 2017-08-12 LAB — BASIC METABOLIC PANEL
Anion gap: 12 (ref 5–15)
BUN: 33 mg/dL — AB (ref 6–20)
CO2: 29 mmol/L (ref 22–32)
CREATININE: 1 mg/dL (ref 0.44–1.00)
Calcium: 9.2 mg/dL (ref 8.9–10.3)
Chloride: 96 mmol/L — ABNORMAL LOW (ref 101–111)
GFR calc Af Amer: >60 mL/min (ref >60)
GFR calc non Af Amer: 60 mL/min — ABNORMAL LOW (ref >60)
GLUCOSE: 96 mg/dL (ref 65–99)
Potassium: 4.9 mmol/L (ref 3.5–5.1)
Sodium: 137 mmol/L (ref 135–145)

## 2017-08-12 MED ORDER — LEVALBUTEROL HCL 1.25 MG/0.5ML IN NEBU
1.2500 mg | INHALATION_SOLUTION | Freq: Three times a day (TID) | RESPIRATORY_TRACT | Status: DC
  Administered 2017-08-13 – 2017-08-15 (×8): 1.25 mg via RESPIRATORY_TRACT
  Filled 2017-08-12 (×8): qty 0.5

## 2017-08-12 MED ORDER — BUPROPION HCL ER (XL) 150 MG PO TB24
150.0000 mg | ORAL_TABLET | Freq: Every day | ORAL | Status: DC
  Administered 2017-08-12 – 2017-08-15 (×4): 150 mg via ORAL
  Filled 2017-08-12 (×4): qty 1

## 2017-08-12 MED ORDER — METOPROLOL TARTRATE 5 MG/5ML IV SOLN: 5.0000 mg | INTRAVENOUS | Status: DC | PRN

## 2017-08-12 MED ORDER — METHYLPREDNISOLONE SODIUM SUCC 125 MG IJ SOLR
60.0000 mg | Freq: Four times a day (QID) | INTRAMUSCULAR | Status: DC
  Administered 2017-08-12 – 2017-08-13 (×4): 60 mg via INTRAVENOUS
  Filled 2017-08-12 (×4): qty 2

## 2017-08-12 MED ORDER — IPRATROPIUM BROMIDE 0.02 % IN SOLN
0.5000 mg | Freq: Three times a day (TID) | RESPIRATORY_TRACT | Status: DC
  Administered 2017-08-13 – 2017-08-15 (×8): 0.5 mg via RESPIRATORY_TRACT
  Filled 2017-08-12 (×8): qty 2.5

## 2017-08-12 MED ORDER — LEVALBUTEROL HCL 1.25 MG/0.5ML IN NEBU
1.2500 mg | INHALATION_SOLUTION | RESPIRATORY_TRACT | Status: DC | PRN
  Administered 2017-08-12: 1.25 mg via RESPIRATORY_TRACT
  Filled 2017-08-12: qty 0.5

## 2017-08-12 MED ORDER — IOPAMIDOL (ISOVUE-370) INJECTION 76%
INTRAVENOUS | Status: AC
  Administered 2017-08-12: 100 mL
  Filled 2017-08-12: qty 100

## 2017-08-12 MED ORDER — IPRATROPIUM BROMIDE 0.02 % IN SOLN
0.5000 mg | RESPIRATORY_TRACT | Status: DC
  Administered 2017-08-12 (×2): 0.5 mg via RESPIRATORY_TRACT
  Filled 2017-08-12 (×3): qty 2.5

## 2017-08-12 MED ORDER — HYDROXYZINE HCL 25 MG PO TABS
25.0000 mg | ORAL_TABLET | Freq: Four times a day (QID) | ORAL | Status: DC | PRN
  Filled 2017-08-12: qty 1

## 2017-08-12 MED ORDER — LEVALBUTEROL HCL 1.25 MG/0.5ML IN NEBU
1.2500 mg | INHALATION_SOLUTION | RESPIRATORY_TRACT | Status: DC
  Administered 2017-08-12 (×2): 1.25 mg via RESPIRATORY_TRACT
  Filled 2017-08-12 (×3): qty 0.5

## 2017-08-12 NOTE — Progress Notes (Addendum)
PROGRESS NOTE [facility]   Vanessa Wallace   ZOX:[id number] DOB: 11/20/1955  DOA: 08/07/2017 PCP: System, Provider Not In   Brief Narrative:  Vanessa Wallace is a 62 year old female with history of COPD, hypertension, migraines, polysubstance abuse and anemia who presented to the ER complaining of shortness of breath and cough.  Upon ED evaluation she was found to be in severe respiratory distress and lethargic, patient was placed on BiPAP and due to possible need of intubation patient was admitted to the ICU for further management.  Workup chest x-ray shows hyperinflation without infiltrates, vital spinal positive for coronavirus.  .  Subjective: Patient seen and examined, she was again on BiPAP.  Overnight patient had other panic attack rapid response was called.  Patient was wheezing, with labored breathing but saturations were okay.  Rapid response team decided to place BiPAP again after Ativan was given with no significant improvement.  This morning patient is removed from BiPAP and placed on nasal cannula, she is mildly tachypneic but more relaxed.  She reports that she does not know why she started to be panicky, but she feels that she can note.  Anxiety kicking syndrome she feels dizzy and with palpitations.  She denies chest pain and weakness.  Assessment & Plan: Acute respiratory failure with hypoxia and hypercapnia due to COPD exacerbation due to viral infection  Patient initially treated with azithromycin, was discontinued given viral etiology (coronavirus) Patient continues to be wheezy, her ABG show mild elevated pCO2 but is compensated therefore is chronic. Will increase steroids to IV 60 mg q 60, agressive nebulizer treatement Given recurrent of events and intermittent hypoxia will get CTA to r/o PE.  Will augment treatment for anxiety see below  Will continue to monitor overnight   Anxiety/Panic Attacks Please do not treat panic attacks with BiPAP if patient is not  desating try conservative measures first She is max out on Cymbalta, will add Wellbutrin 150 mg daily, add Vistaril for PRN for panic attacks, will try to avoid benzo as patient is positive for cocaine, also decrease respiratory drive.  Hypertension BP stable  Continue current regimen  Avoid beta-blockers as patient is a cocaine user  Chronic anemia - Iron deficiency  Hgb stable  IV iron given x 1  Will add iron supplementation upon d/c  If Hgb less 7 will transfuse  Monitor CBC in AM   Acute metabolic encephalopathy - resolved Due to hypercapnic respiratory failure Treat underlying cause, continue supportive care  Leukocytosis due to steroid use   DVT prophylaxis: Lovenox Code Status: Full code Family Communication: None at bedside Disposition Plan: Home in AM    Consultants:   PCCM  Procedures:   None  Antimicrobials: Anti-infectives (From admission, onward)   Start     Dose/Rate Route Frequency Ordered Stop   08/09/17 1000  azithromycin (ZITHROMAX) tablet 250 mg  Status:  Discontinued     250 mg Oral Daily 08/08/17 0059 08/09/17 1052   08/08/17 0115  azithromycin (ZITHROMAX) tablet 500 mg     500 mg Oral Daily 08/08/17 0059 08/08/17 0128      Objective: Vitals:   08/12/17 0806 08/12/17 0807 08/12/17 0918 08/12/17 1141  BP:   112/65   Pulse:      Resp:      Temp:      TempSrc:      SpO2: 100% 100%  100%  Weight:      Height:        Intake/Output Summary (  Last 24 hours) at 08/12/2017 1148 Last data filed at 08/12/2017 1134 Gross per 24 hour  Intake 100 ml  Output 420 ml  Net -320 ml   Filed Weights   08/08/17 0700 08/11/17 2157  Weight: 51.6 kg (113 lb 12.1 oz) 43.7 kg (96 lb 5.5 oz)    Examination:  General: NAD  Cardiovascular: Tachycardia, S1/S2 +, no rubs, no gallops Respiratory: Air entry diminished, diffuse expiratory wheezy, mild tachypnea @ 26. Breathing labored  Abdominal: Soft, NT, ND, bowel sounds + Extremities: no edema  Data  Reviewed: I have personally reviewed following labs and imaging studies  CBC: Recent Labs  Lab 08/07/17 1825 08/10/17 0845 08/12/17 0207  WBC 10.3 10.2 15.7*  NEUTROABS 8.9*  --   --   HGB 7.8* 8.0* 8.5*  HCT 27.2* 29.0* 30.4*  MCV 66.2* 68.2* 67.0*  PLT 286 308 320   Basic Metabolic Panel: Recent Labs  Lab 08/07/17 1825 08/10/17 0845 08/12/17 0207  NA 140 139 137  K 4.0 4.5 4.9  CL 106 100* 96*  CO2 23 27 29   GLUCOSE 88 99 96  BUN 14 20 33*  CREATININE 0.79 0.83 1.00  CALCIUM 8.7* 9.0 9.2   GFR: Estimated Creatinine Clearance: 41.3 mL/min (by C-G formula based on SCr of 1 mg/dL). Liver Function Tests: Recent Labs  Lab 08/07/17 1825  AST 28  ALT 22  ALKPHOS 68  BILITOT 0.6  PROT 7.2  ALBUMIN 3.4*   No results for input(s): LIPASE, AMYLASE in the last 168 hours. No results for input(s): AMMONIA in the last 168 hours. Coagulation Profile: No results for input(s): INR, PROTIME in the last 168 hours. Cardiac Enzymes: No results for input(s): CKTOTAL, CKMB, CKMBINDEX, TROPONINI in the last 168 hours. BNP (last 3 results) No results for input(s): PROBNP in the last 8760 hours. HbA1C: No results for input(s): HGBA1C in the last 72 hours. CBG: Recent Labs  Lab 08/08/17 1610  GLUCAP 191*   Lipid Profile: No results for input(s): CHOL, HDL, LDLCALC, TRIG, CHOLHDL, LDLDIRECT in the last 72 hours. Thyroid Function Tests: No results for input(s): TSH, T4TOTAL, FREET4, T3FREE, THYROIDAB in the last 72 hours. Anemia Panel: Recent Labs    08/10/17 0845  VITAMINB12 383  FOLATE 10.5  FERRITIN 8*  TIBC 521*  IRON 13*  RETICCTPCT 0.9   Sepsis Labs: Recent Labs  Lab 08/07/17 1841 08/10/17 0845  PROCALCITON  --  <0.10  LATICACIDVEN 1.02  --     Recent Results (from the past 240 hour(s))  Culture, blood (routine x 2) Call MD if unable to obtain prior to antibiotics being given     Status: None (Preliminary result)   Collection Time: 08/08/17  5:00 AM    Result Value Ref Range Status   Specimen Description BLOOD RIGHT HAND  Final   Special Requests   Final    BOTTLES DRAWN AEROBIC AND ANAEROBIC Blood Culture adequate volume   Culture NO GROWTH 3 DAYS  Final   Report Status PENDING  Incomplete  Culture, blood (routine x 2) Call MD if unable to obtain prior to antibiotics being given     Status: None (Preliminary result)   Collection Time: 08/08/17  5:10 AM  Result Value Ref Range Status   Specimen Description BLOOD RIGHT HAND  Final   Special Requests IN PEDIATRIC BOTTLE Blood Culture adequate volume  Final   Culture NO GROWTH 3 DAYS  Final   Report Status PENDING  Incomplete  MRSA PCR Screening  Status: None   Collection Time: 08/08/17  6:58 AM  Result Value Ref Range Status   MRSA by PCR NEGATIVE NEGATIVE Final    Comment:        The GeneXpert MRSA Assay (FDA approved for NASAL specimens only), is one component of a comprehensive MRSA colonization surveillance program. It is not intended to diagnose MRSA infection nor to guide or monitor treatment for MRSA infections.   Respiratory Panel by PCR     Status: Abnormal   Collection Time: 08/08/17  1:14 PM  Result Value Ref Range Status   Adenovirus NOT DETECTED NOT DETECTED Final   Coronavirus 229E NOT DETECTED NOT DETECTED Final   Coronavirus HKU1 NOT DETECTED NOT DETECTED Final   Coronavirus NL63 NOT DETECTED NOT DETECTED Final   Coronavirus OC43 DETECTED (A) NOT DETECTED Final   Metapneumovirus NOT DETECTED NOT DETECTED Final   Rhinovirus / Enterovirus NOT DETECTED NOT DETECTED Final   Influenza A NOT DETECTED NOT DETECTED Final   Influenza B NOT DETECTED NOT DETECTED Final   Parainfluenza Virus 1 NOT DETECTED NOT DETECTED Final   Parainfluenza Virus 2 NOT DETECTED NOT DETECTED Final   Parainfluenza Virus 3 NOT DETECTED NOT DETECTED Final   Parainfluenza Virus 4 NOT DETECTED NOT DETECTED Final   Respiratory Syncytial Virus NOT DETECTED NOT DETECTED Final   Bordetella  pertussis NOT DETECTED NOT DETECTED Final   Chlamydophila pneumoniae NOT DETECTED NOT DETECTED Final   Mycoplasma pneumoniae NOT DETECTED NOT DETECTED Final      Radiology Studies: Dg Chest Port 1 View  Result Date: 08/11/2017 CLINICAL DATA:  Respiratory distress. Patient is on BiPAP. Desaturations. History of COPD. EXAM: PORTABLE CHEST 1 VIEW COMPARISON:  08/07/2017 FINDINGS: Diffuse emphysematous changes in the lungs. Scattered interstitial pattern with peribronchial thickening suggesting chronic bronchitis. No airspace disease or consolidation. No blunting of costophrenic angles. No pneumothorax. Normal heart size and pulmonary vascularity. Calcification of the aorta. IMPRESSION: Emphysematous and chronic bronchitic changes in the lungs. No evidence of active pulmonary disease. Aortic atherosclerosis. Electronically Signed   By: Burman Nieves M.D.   On: 08/11/2017 22:32      Scheduled Meds: . arformoterol  15 mcg Nebulization BID  . budesonide (PULMICORT) nebulizer solution  0.5 mg Nebulization BID  . DULoxetine  60 mg Oral Daily  . enoxaparin (LOVENOX) injection  40 mg Subcutaneous Daily  . feeding supplement  1 Container Oral TID BM  . iopamidol      . ipratropium  0.5 mg Nebulization Q4H  . levalbuterol  1.25 mg Nebulization Q4H  . lisinopril  5 mg Oral Daily  . methylPREDNISolone (SOLU-MEDROL) injection  60 mg Intravenous Q6H  . multivitamin with minerals  1 tablet Oral Daily  . triamterene-hydrochlorothiazide  0.5 tablet Oral Daily   Continuous Infusions:   LOS: 4 days    Time spent: Total of 25 minutes spent with pt, greater than 50% of which was spent in discussion of  treatment, counseling and coordination of care   Latrelle Dodrill, MD Pager: Text Page via [email or web address]   If 7PM-7AM, please contact night-coverage [email or web address] 08/12/2017, 11:48 AM

## 2017-08-13 LAB — BASIC METABOLIC PANEL
ANION GAP: 10 (ref 5–15)
BUN: 46 mg/dL — ABNORMAL HIGH (ref 6–20)
CHLORIDE: 97 mmol/L — AB (ref 101–111)
CO2: 30 mmol/L (ref 22–32)
Calcium: 9.8 mg/dL (ref 8.9–10.3)
Creatinine, Ser: 1 mg/dL (ref 0.44–1.00)
GFR calc non Af Amer: 60 mL/min — ABNORMAL LOW (ref >60)
GLUCOSE: 119 mg/dL — AB (ref 65–99)
Potassium: 5.4 mmol/L — ABNORMAL HIGH (ref 3.5–5.1)
Sodium: 137 mmol/L (ref 135–145)

## 2017-08-13 LAB — CBC WITH DIFFERENTIAL/PLATELET
BASOS ABS: 0 10*3/uL (ref 0.0–0.1)
Basophils Relative: 0 %
Eosinophils Absolute: 0 10*3/uL (ref 0.0–0.7)
Eosinophils Relative: 0 %
HCT: 31.2 % — ABNORMAL LOW (ref 36.0–46.0)
Hemoglobin: 9 g/dL — ABNORMAL LOW (ref 12.0–15.0)
LYMPHS ABS: 0.6 10*3/uL — AB (ref 0.7–4.0)
LYMPHS PCT: 3 %
MCH: 19.1 pg — ABNORMAL LOW (ref 26.0–34.0)
MCHC: 28.8 g/dL — ABNORMAL LOW (ref 30.0–36.0)
MCV: 66.4 fL — AB (ref 78.0–100.0)
MONO ABS: 0.4 10*3/uL (ref 0.1–1.0)
Monocytes Relative: 2 %
NEUTROS PCT: 95 %
Neutro Abs: 19.5 10*3/uL — ABNORMAL HIGH (ref 1.7–7.7)
PLATELETS: 303 10*3/uL (ref 150–400)
RBC: 4.7 MIL/uL (ref 3.87–5.11)
RDW: 19 % — AB (ref 11.5–15.5)
WBC: 20.5 10*3/uL — ABNORMAL HIGH (ref 4.0–10.5)

## 2017-08-13 LAB — CULTURE, BLOOD (ROUTINE X 2)
CULTURE: NO GROWTH
Culture: NO GROWTH
SPECIAL REQUESTS: ADEQUATE
SPECIAL REQUESTS: ADEQUATE

## 2017-08-13 MED ORDER — FERROUS SULFATE 325 (65 FE) MG PO TABS
325.0000 mg | ORAL_TABLET | Freq: Two times a day (BID) | ORAL | Status: DC
  Administered 2017-08-13 – 2017-08-15 (×3): 325 mg via ORAL
  Filled 2017-08-13 (×3): qty 1

## 2017-08-13 MED ORDER — METHYLPREDNISOLONE SODIUM SUCC 125 MG IJ SOLR
60.0000 mg | Freq: Two times a day (BID) | INTRAMUSCULAR | Status: DC
  Administered 2017-08-13 – 2017-08-14 (×3): 60 mg via INTRAVENOUS
  Filled 2017-08-13 (×3): qty 2

## 2017-08-13 MED ORDER — AMOXICILLIN-POT CLAVULANATE 875-125 MG PO TABS
1.0000 | ORAL_TABLET | Freq: Two times a day (BID) | ORAL | Status: DC
  Administered 2017-08-13 – 2017-08-15 (×5): 1 via ORAL
  Filled 2017-08-13 (×7): qty 1

## 2017-08-13 MED ORDER — SODIUM CHLORIDE 0.9 % IV BOLUS (SEPSIS)
500.0000 mL | Freq: Once | INTRAVENOUS | Status: AC
  Administered 2017-08-13: 500 mL via INTRAVENOUS

## 2017-08-13 NOTE — Progress Notes (Signed)
PROGRESS NOTE [facility]   Vanessa Wallace   ZOX:[id number] DOB: August 15, 1955  DOA: 08/07/2017 PCP: System, Provider Not In   Brief Narrative:  Vanessa Wallace is a 62 year old female with history of COPD, hypertension, migraines, polysubstance abuse and anemia who presented to the ER complaining of shortness of breath and cough.  Upon ED evaluation she was found to be in severe respiratory distress and lethargic, patient was placed on BiPAP and due to possible need of intubation patient was admitted to the ICU for further management.  Workup chest x-ray shows hyperinflation without infiltrates, vital spinal positive for coronavirus. CTA shows multifocal PNA. Negative for PE   Subjective: Patient seen and examined, she has been off BiPAP since yesterday. Wheezing improved but still present. No acute events overnight. Remains afebrile   Assessment & Plan: Acute respiratory failure with hypoxia and hypercapnia due to COPD exacerbation due to viral infection (Coronavirus) with superimposed ? Bacterial pneumonia   Patient initially treated with azithromycin, was discontinued given viral etiology (coronavirus) Patient continues to be wheezy, her ABG show mild elevated pCO2 but is compensated therefore is chronic.  CT shows there is a multifocal PNA ? Bacterial (Procalcitonin was negative) vs Aspiration probably from BiPAP, Started on Augmentin to complete 8 days  CTA negative for PE  Taper solumedrol to 60 q12, transition to prednisone and continue taper for 14 days If patient remains stable in AM possible d/c to complete therapy at home  Check pulse ox with ambulation, she may need home oxygen  Anxiety improved   Anxiety/Panic Attacks Please do not treat panic attacks with BiPAP if patient is not desating try conservative measures first She is max out on Cymbalta, added Wellbutrin 150 mg daily, add Vistaril  PRN for panic attacks, will try to avoid benzo as patient is positive for  cocaine, also will decrease respiratory drive.  Hypertension BP stable  Continue current regimen  Avoid beta-blockers as patient is a cocaine user  Chronic anemia - Iron deficiency  Hgb stable  IV iron given x 1  Ferrous sulfate 325 mg BID  If Hgb less 7 will transfuse  Monitor CBC in AM   Acute metabolic encephalopathy - resolved Due to hypercapnic respiratory failure Treat underlying cause, continue supportive care  Leukocytosis due to high dose steroid   Mild Hyperkalemia Will give IV bolus 500 cc Monitor in AM   DVT prophylaxis: Lovenox Code Status: Full code Family Communication: None at bedside Disposition Plan: Home in AM    Consultants:   PCCM  Procedures:   None  Antimicrobials: Anti-infectives (From admission, onward)   Start     Dose/Rate Route Frequency Ordered Stop   08/13/17 1000  amoxicillin-clavulanate (AUGMENTIN) 875-125 MG per tablet 1 tablet     1 tablet Oral Every 12 hours 08/13/17 0847     08/09/17 1000  azithromycin (ZITHROMAX) tablet 250 mg  Status:  Discontinued     250 mg Oral Daily 08/08/17 0059 08/09/17 1052   08/08/17 0115  azithromycin (ZITHROMAX) tablet 500 mg     500 mg Oral Daily 08/08/17 0059 08/08/17 0128      Objective: Vitals:   08/12/17 2319 08/13/17 0403 08/13/17 0723 08/13/17 0747  BP: 134/90 137/83    Pulse: 83 89    Resp: 15 14    Temp: 98.5 F (36.9 C) 98.2 F (36.8 C) 98.1 F (36.7 C)   TempSrc: Oral Oral Oral   SpO2: 100% 96%  99%  Weight:  Height:        Intake/Output Summary (Last 24 hours) at 08/13/2017 0847 Last data filed at 08/13/2017 1324 Gross per 24 hour  Intake 702 ml  Output 1250 ml  Net -548 ml   Filed Weights   08/08/17 0700 08/11/17 2157  Weight: 51.6 kg (113 lb 12.1 oz) 43.7 kg (96 lb 5.5 oz)    Examination:  General: Pt is alert, awake, not in acute distress Cardiovascular: RRR, S1/S2 +, no rubs, no gallops Respiratory: Air entry improved, mild diffuse expiratory wheezing.  No crackles, Non labored  Abdominal: Soft, NT, ND, bowel sounds + Extremities: no edema  Data Reviewed: I have personally reviewed following labs and imaging studies  CBC: Recent Labs  Lab 08/07/17 1825 08/10/17 0845 08/12/17 0207  WBC 10.3 10.2 15.7*  NEUTROABS 8.9*  --   --   HGB 7.8* 8.0* 8.5*  HCT 27.2* 29.0* 30.4*  MCV 66.2* 68.2* 67.0*  PLT 286 308 320   Basic Metabolic Panel: Recent Labs  Lab 08/07/17 1825 08/10/17 0845 08/12/17 0207  NA 140 139 137  K 4.0 4.5 4.9  CL 106 100* 96*  CO2 23 27 29   GLUCOSE 88 99 96  BUN 14 20 33*  CREATININE 0.79 0.83 1.00  CALCIUM 8.7* 9.0 9.2   GFR: Estimated Creatinine Clearance: 41.3 mL/min (by C-G formula based on SCr of 1 mg/dL). Liver Function Tests: Recent Labs  Lab 08/07/17 1825  AST 28  ALT 22  ALKPHOS 68  BILITOT 0.6  PROT 7.2  ALBUMIN 3.4*   No results for input(s): LIPASE, AMYLASE in the last 168 hours. No results for input(s): AMMONIA in the last 168 hours. Coagulation Profile: No results for input(s): INR, PROTIME in the last 168 hours. Cardiac Enzymes: No results for input(s): CKTOTAL, CKMB, CKMBINDEX, TROPONINI in the last 168 hours. BNP (last 3 results) No results for input(s): PROBNP in the last 8760 hours. HbA1C: No results for input(s): HGBA1C in the last 72 hours. CBG: Recent Labs  Lab 08/08/17 1610  GLUCAP 191*   Lipid Profile: No results for input(s): CHOL, HDL, LDLCALC, TRIG, CHOLHDL, LDLDIRECT in the last 72 hours. Thyroid Function Tests: No results for input(s): TSH, T4TOTAL, FREET4, T3FREE, THYROIDAB in the last 72 hours. Anemia Panel: No results for input(s): VITAMINB12, FOLATE, FERRITIN, TIBC, IRON, RETICCTPCT in the last 72 hours. Sepsis Labs: Recent Labs  Lab 08/07/17 1841 08/10/17 0845  PROCALCITON  --  <0.10  LATICACIDVEN 1.02  --     Recent Results (from the past 240 hour(s))  Culture, blood (routine x 2) Call MD if unable to obtain prior to antibiotics being given      Status: None (Preliminary result)   Collection Time: 08/08/17  5:00 AM  Result Value Ref Range Status   Specimen Description BLOOD RIGHT HAND  Final   Special Requests   Final    BOTTLES DRAWN AEROBIC AND ANAEROBIC Blood Culture adequate volume   Culture NO GROWTH 4 DAYS  Final   Report Status PENDING  Incomplete  Culture, blood (routine x 2) Call MD if unable to obtain prior to antibiotics being given     Status: None (Preliminary result)   Collection Time: 08/08/17  5:10 AM  Result Value Ref Range Status   Specimen Description BLOOD RIGHT HAND  Final   Special Requests IN PEDIATRIC BOTTLE Blood Culture adequate volume  Final   Culture NO GROWTH 4 DAYS  Final   Report Status PENDING  Incomplete  MRSA PCR  Screening     Status: None   Collection Time: 08/08/17  6:58 AM  Result Value Ref Range Status   MRSA by PCR NEGATIVE NEGATIVE Final    Comment:        The GeneXpert MRSA Assay (FDA approved for NASAL specimens only), is one component of a comprehensive MRSA colonization surveillance program. It is not intended to diagnose MRSA infection nor to guide or monitor treatment for MRSA infections.   Respiratory Panel by PCR     Status: Abnormal   Collection Time: 08/08/17  1:14 PM  Result Value Ref Range Status   Adenovirus NOT DETECTED NOT DETECTED Final   Coronavirus 229E NOT DETECTED NOT DETECTED Final   Coronavirus HKU1 NOT DETECTED NOT DETECTED Final   Coronavirus NL63 NOT DETECTED NOT DETECTED Final   Coronavirus OC43 DETECTED (A) NOT DETECTED Final   Metapneumovirus NOT DETECTED NOT DETECTED Final   Rhinovirus / Enterovirus NOT DETECTED NOT DETECTED Final   Influenza A NOT DETECTED NOT DETECTED Final   Influenza B NOT DETECTED NOT DETECTED Final   Parainfluenza Virus 1 NOT DETECTED NOT DETECTED Final   Parainfluenza Virus 2 NOT DETECTED NOT DETECTED Final   Parainfluenza Virus 3 NOT DETECTED NOT DETECTED Final   Parainfluenza Virus 4 NOT DETECTED NOT DETECTED Final    Respiratory Syncytial Virus NOT DETECTED NOT DETECTED Final   Bordetella pertussis NOT DETECTED NOT DETECTED Final   Chlamydophila pneumoniae NOT DETECTED NOT DETECTED Final   Mycoplasma pneumoniae NOT DETECTED NOT DETECTED Final      Radiology Studies: Ct Angio Chest Pe W Or Wo Contrast  Result Date: 08/12/2017 CLINICAL DATA:  Shortness of breath. EXAM: CT ANGIOGRAPHY CHEST WITH CONTRAST TECHNIQUE: Multidetector CT imaging of the chest was performed using the standard protocol during bolus administration of intravenous contrast. Multiplanar CT image reconstructions and MIPs were obtained to evaluate the vascular anatomy. CONTRAST:  100mL ISOVUE-370 IOPAMIDOL (ISOVUE-370) INJECTION 76% COMPARISON:  None. FINDINGS: Cardiovascular: Normal heart size. Aortic atherosclerosis. Calcification within the LAD, RCA and left circumflex coronary arteries. No pericardial effusion. The main pulmonary artery is patent. No saddle embolus. No lobar or segmental pulmonary artery filling defects identified Mediastinum/Nodes: The trachea appears patent and is midline. Normal appearance of the esophagus. No mediastinal adenopathy. Bilateral enlarged hilar lymph nodes identified. Index left hilar node measures 1.3 cm, image 144 of series 7. Right hilar node measures 1.9 cm, image 127 of series 2. Lungs/Pleura: No pleural effusion. Moderate to advanced changes of emphysema. Subpleural airspace consolidation within the posterior and posteromedial left lower lobe is identified, image 108 of series 6. Mild subpleural consolidation within the posterior and posteromedial right lower lobe noted, image 90 of series 6. Upper Abdomen: There is reflux of contrast material from the right atrium into the hepatic veins. No acute abnormality noted within the upper abdomen. Musculoskeletal: T7 compression deformity is age indeterminate with loss of approximately 20% of the superior endplate. No suspicious bone lesions identified. Review of the  MIP images confirms the above findings. IMPRESSION: 1. No evidence for acute pulmonary embolus. 2. Bilateral lower lobe subpleural consolidation is identified which may represent multifocal pneumonia and/or aspiration. 3. Aortic Atherosclerosis (ICD10-I70.0) and Emphysema (ICD10-J43.9). 4. Three vessel coronary artery calcifications noted. 5. Age-indeterminate T7 compression deformity. Electronically Signed   By: Signa Kellaylor  Stroud M.D.   On: 08/12/2017 12:56   Dg Chest Port 1 View  Result Date: 08/11/2017 CLINICAL DATA:  Respiratory distress. Patient is on BiPAP. Desaturations. History of COPD. EXAM: PORTABLE  CHEST 1 VIEW COMPARISON:  08/07/2017 FINDINGS: Diffuse emphysematous changes in the lungs. Scattered interstitial pattern with peribronchial thickening suggesting chronic bronchitis. No airspace disease or consolidation. No blunting of costophrenic angles. No pneumothorax. Normal heart size and pulmonary vascularity. Calcification of the aorta. IMPRESSION: Emphysematous and chronic bronchitic changes in the lungs. No evidence of active pulmonary disease. Aortic atherosclerosis. Electronically Signed   By: Burman Nieves M.D.   On: 08/11/2017 22:32      Scheduled Meds: . amoxicillin-clavulanate  1 tablet Oral Q12H  . arformoterol  15 mcg Nebulization BID  . budesonide (PULMICORT) nebulizer solution  0.5 mg Nebulization BID  . buPROPion  150 mg Oral Daily  . DULoxetine  60 mg Oral Daily  . enoxaparin (LOVENOX) injection  40 mg Subcutaneous Daily  . feeding supplement  1 Container Oral TID BM  . ipratropium  0.5 mg Nebulization TID  . levalbuterol  1.25 mg Nebulization TID  . lisinopril  5 mg Oral Daily  . methylPREDNISolone (SOLU-MEDROL) injection  60 mg Intravenous Q6H  . multivitamin with minerals  1 tablet Oral Daily  . triamterene-hydrochlorothiazide  0.5 tablet Oral Daily   Continuous Infusions:   LOS: 5 days    Time spent: Total of 25 minutes spent with pt, greater than 50% of  which was spent in discussion of  treatment, counseling and coordination of care   Latrelle Dodrill, MD Pager: Text Page via [email or web address]   If 7PM-7AM, please contact night-coverage [email or web address] 08/13/2017, 8:47 AM

## 2017-08-13 NOTE — Progress Notes (Signed)
Pt refused ambulation to check sats. Will try again later.  [place]  Destynee Stringfellow, RN

## 2017-08-13 NOTE — Care Management Note (Signed)
Case Management Note  Patient Details  Name: Vanessa Wallace MRN: [id number] Date of Birth: Jan 11, 1956  Subjective/Objective:     Pt admitted with SOB and cough               Action/Plan:  PTA from home.     Expected Discharge Date:                  Expected Discharge Plan:  Home/Self Care  In-House Referral:     Discharge planning Services  CM Consult  Post Acute Care Choice:    Choice offered to:     DME Arranged:    DME Agency:     HH Arranged:    HH Agency:     Status of Service:  In process, will continue to follow  If discussed at Long Length of Stay Meetings, dates discussed:    Additional Comments:  Cherylann ParrClaxton, Colie Josten S, RN 08/13/2017, 10:28 AM

## 2017-08-13 NOTE — Progress Notes (Signed)
Transfer Note:   Traveling Method: WC Transferring Unit: 2C Mental Orientation: A&O X4 Telemetry: Per MD orders Assessment: Completed Skin: Warm, dry and intact. IV: Clean, dry and intact. Pain: Denies Safety Measures: Safety Fall Prevention Plan has been given, discussed and signed Admission: Completed 5MW Orientation: Patient has been orientated to the room, unit and staff.    Orders have been reviewed and implemented. Will continue to monitor the patient. Call light has been placed within reach and bed alarm has been activated.   Britt BologneseAnisha Mabe BSN, RN [facility] Phone: [phone]

## 2017-08-14 LAB — BASIC METABOLIC PANEL
ANION GAP: 9 (ref 5–15)
BUN: 44 mg/dL — ABNORMAL HIGH (ref 6–20)
CHLORIDE: 100 mmol/L — AB (ref 101–111)
CO2: 29 mmol/L (ref 22–32)
CREATININE: 0.89 mg/dL (ref 0.44–1.00)
Calcium: 9.4 mg/dL (ref 8.9–10.3)
GFR calc non Af Amer: >60 mL/min (ref >60)
Glucose, Bld: 82 mg/dL (ref 65–99)
Potassium: 5.1 mmol/L (ref 3.5–5.1)
Sodium: 138 mmol/L (ref 135–145)

## 2017-08-14 LAB — CBC WITH DIFFERENTIAL/PLATELET
Basophils Absolute: 0 10*3/uL (ref 0.0–0.1)
Basophils Relative: 0 %
Eosinophils Absolute: 0 10*3/uL (ref 0.0–0.7)
Eosinophils Relative: 0 %
HCT: 28.9 % — ABNORMAL LOW (ref 36.0–46.0)
HEMOGLOBIN: 8.3 g/dL — AB (ref 12.0–15.0)
LYMPHS ABS: 2.2 10*3/uL (ref 0.7–4.0)
Lymphocytes Relative: 11 %
MCH: 19.2 pg — AB (ref 26.0–34.0)
MCHC: 28.7 g/dL — AB (ref 30.0–36.0)
MCV: 66.7 fL — ABNORMAL LOW (ref 78.0–100.0)
MONOS PCT: 5 %
Monocytes Absolute: 1 10*3/uL (ref 0.1–1.0)
NEUTROS ABS: 16.7 10*3/uL — AB (ref 1.7–7.7)
NEUTROS PCT: 84 %
Platelets: 314 10*3/uL (ref 150–400)
RBC: 4.33 MIL/uL (ref 3.87–5.11)
RDW: 19.7 % — ABNORMAL HIGH (ref 11.5–15.5)
WBC: 19.8 10*3/uL — ABNORMAL HIGH (ref 4.0–10.5)

## 2017-08-14 LAB — STREP PNEUMONIAE URINARY ANTIGEN: Strep Pneumo Urinary Antigen: NEGATIVE

## 2017-08-14 MED ORDER — METHYLPREDNISOLONE SODIUM SUCC 125 MG IJ SOLR: 60.0000 mg | INTRAMUSCULAR | Status: DC

## 2017-08-14 NOTE — Care Management Note (Addendum)
Case Management Note  Patient Details  Name: Vanessa Wallace MRN: [id number] Date of Birth: 24-Jan-1956  Subjective/Objective:   Admitted w/ hx COPD for severe respiratory distress.        Action/Plan: Put on Bipap; chest x-ray shows hyperinflation without infiltrates, vital spinal positive for coronavirus. CTA shows multifocal PNA. initially treated with azithromycin, was discontinued Started on Augmentin 8 days.Taper solumedrol to 60 q12, transition to prednisone.  Remains on 2L O2 .  Expected Discharge Date:                  Expected Discharge Plan:  Home/Self Care  Discharge planning Services  CM Consult  Status of Service:  In process, will continue to follow  If discussed at Long Length of Stay Meetings, dates discussed:    Additional Comments: In to speak with patient.  Patient states she had appointment today at [facility] at 3:15pm. Patient gave verbal permission for NCM to cancel appointment. NCM called [facility] and  Spoke with Darcie, canceled patient appointment; advised them patient in hospital. Prior to admission patient lived at home alone.  Uses [facility] on [address] [place]  PCP is  Dr. Clide DeutscherBlunt.  Home DME: cane, nebulizer.  [facility] if services are needed at discharge.  Patient takes the bus to shop and medical appointments.  Patient denies inability to afford food or medications.  Patient states she does smoke and if oxygen is suggested, she does not want any tanks in her home.  NCM will continue to monitor for discharge transition.  Yancey FlemingsKimberly R Ishanvi Mcquitty, RN  Nurse case manager [phone] 08/14/2017, 2:44 PM

## 2017-08-14 NOTE — Progress Notes (Signed)
PROGRESS NOTE [facility]   [name]   XLK:[id number] DOB: 1955-11-04  DOA: 08/07/2017 PCP: System, Provider Not In   Brief Narrative:  Vanessa Wallace is a 62 year old female with history of COPD, hypertension, migraines, polysubstance abuse and anemia who presented to the ER complaining of shortness of breath and cough.  Upon ED evaluation she was found to be in severe respiratory distress and lethargic, patient was placed on BiPAP and due to possible need of intubation patient was admitted to the ICU for further management.  Workup chest x-ray shows hyperinflation without infiltrates, vital spinal positive for coronavirus. CTA shows multifocal PNA. Negative for PE   Subjective: No new complaints  Assessment & Plan: Acute respiratory failure with hypoxia and hypercapnia due to COPD exacerbation due to viral infection (Coronavirus) with superimposed ? Bacterial pneumonia   Patient initially treated with azithromycin, was discontinued given viral etiology (coronavirus) Patient continues to be wheezy, her ABG show mild elevated pCO2 but is compensated therefore is chronic.  CT shows there is a multifocal PNA ? Bacterial (Procalcitonin was negative) vs Aspiration probably from BiPAP, Started on Augmentin to complete 8 days  CTA negative for PE  Taper solumedrol to 60 q day then transition to prednisone and continue taper for 14 days Pt on oxygen but not on oxygen at home. Will place order to wean to room air.  Anxiety/Panic Attacks Please do not treat panic attacks with BiPAP if patient is not desating try conservative measures first She is max out on Cymbalta, added Wellbutrin 150 mg daily, add Vistaril  PRN for panic attacks, will try to avoid benzo as patient is positive for cocaine, also will decrease respiratory drive.  Hypertension BP stable  Continue current regimen  Avoid beta-blockers as patient is a cocaine user  Chronic anemia - Iron deficiency  Hgb stable  IV  iron given x 1  Ferrous sulfate 325 mg BID  If Hgb less 7 will transfuse  Monitor CBC in AM   Acute metabolic encephalopathy - resolved Due to hypercapnic respiratory failure Treat underlying cause, continue supportive care  Leukocytosis due to high dose steroid   Mild Hyperkalemia Will give IV bolus 500 cc Monitor in AM   DVT prophylaxis: Lovenox Code Status: Full code Family Communication: None at bedside Disposition Plan: Home in AM    Consultants:   PCCM  Procedures:   None  Antimicrobials: Anti-infectives (From admission, onward)   Start     Dose/Rate Route Frequency Ordered Stop   08/13/17 1000  amoxicillin-clavulanate (AUGMENTIN) 875-125 MG per tablet 1 tablet     1 tablet Oral Every 12 hours 08/13/17 0847     08/09/17 1000  azithromycin (ZITHROMAX) tablet 250 mg  Status:  Discontinued     250 mg Oral Daily 08/08/17 0059 08/09/17 1052   08/08/17 0115  azithromycin (ZITHROMAX) tablet 500 mg     500 mg Oral Daily 08/08/17 0059 08/08/17 0128      Objective: Vitals:   08/14/17 0851 08/14/17 0855 08/14/17 1357 08/14/17 1546  BP:  (!) 186/91  128/72  Pulse:  92  84  Resp:  18  18  Temp:  98.2 F (36.8 C)  98.7 F (37.1 C)  TempSrc:  Oral  Oral  SpO2: 94% 98% 93% 95%  Weight:      Height:        Intake/Output Summary (Last 24 hours) at 08/14/2017 1719 Last data filed at 08/14/2017 1300 Gross per 24 hour  Intake 960  ml  Output 900 ml  Net 60 ml   Filed Weights   08/08/17 0700 08/11/17 2157  Weight: 51.6 kg (113 lb 12.1 oz) 43.7 kg (96 lb 5.5 oz)    Examination:  General: Pt is alert, awake, not in no acute distress Cardiovascular: RRR, S1/S2 +, no rubs, no gallops Respiratory: Air entry improved, mild diffuse expiratory wheezing. No crackles, Non labored  Abdominal: Soft, NT, ND, bowel sounds + Extremities: no edema  Data Reviewed: I have personally reviewed following labs and imaging studies  CBC: Recent Labs  Lab 08/07/17 1825  08/10/17 0845 08/12/17 0207 08/13/17 0804 08/14/17 0550  WBC 10.3 10.2 15.7* 20.5* 19.8*  NEUTROABS 8.9*  --   --  19.5* 16.7*  HGB 7.8* 8.0* 8.5* 9.0* 8.3*  HCT 27.2* 29.0* 30.4* 31.2* 28.9*  MCV 66.2* 68.2* 67.0* 66.4* 66.7*  PLT 286 308 320 303 314   Basic Metabolic Panel: Recent Labs  Lab 08/07/17 1825 08/10/17 0845 08/12/17 0207 08/13/17 0804 08/14/17 0550  NA 140 139 137 137 138  K 4.0 4.5 4.9 5.4* 5.1  CL 106 100* 96* 97* 100*  CO2 23 27 29 30 29   GLUCOSE 88 99 96 119* 82  BUN 14 20 33* 46* 44*  CREATININE 0.79 0.83 1.00 1.00 0.89  CALCIUM 8.7* 9.0 9.2 9.8 9.4   GFR: Estimated Creatinine Clearance: 46.4 mL/min (by C-G formula based on SCr of 0.89 mg/dL). Liver Function Tests: Recent Labs  Lab 08/07/17 1825  AST 28  ALT 22  ALKPHOS 68  BILITOT 0.6  PROT 7.2  ALBUMIN 3.4*   No results for input(s): LIPASE, AMYLASE in the last 168 hours. No results for input(s): AMMONIA in the last 168 hours. Coagulation Profile: No results for input(s): INR, PROTIME in the last 168 hours. Cardiac Enzymes: No results for input(s): CKTOTAL, CKMB, CKMBINDEX, TROPONINI in the last 168 hours. BNP (last 3 results) No results for input(s): PROBNP in the last 8760 hours. HbA1C: No results for input(s): HGBA1C in the last 72 hours. CBG: Recent Labs  Lab 08/08/17 1610  GLUCAP 191*   Lipid Profile: No results for input(s): CHOL, HDL, LDLCALC, TRIG, CHOLHDL, LDLDIRECT in the last 72 hours. Thyroid Function Tests: No results for input(s): TSH, T4TOTAL, FREET4, T3FREE, THYROIDAB in the last 72 hours. Anemia Panel: No results for input(s): VITAMINB12, FOLATE, FERRITIN, TIBC, IRON, RETICCTPCT in the last 72 hours. Sepsis Labs: Recent Labs  Lab 08/07/17 1841 08/10/17 0845  PROCALCITON  --  <0.10  LATICACIDVEN 1.02  --     Recent Results (from the past 240 hour(s))  Culture, blood (routine x 2) Call MD if unable to obtain prior to antibiotics being given     Status: None    Collection Time: 08/08/17  5:00 AM  Result Value Ref Range Status   Specimen Description BLOOD RIGHT HAND  Final   Special Requests   Final    BOTTLES DRAWN AEROBIC AND ANAEROBIC Blood Culture adequate volume   Culture NO GROWTH 5 DAYS  Final   Report Status 08/13/2017 FINAL  Final  Culture, blood (routine x 2) Call MD if unable to obtain prior to antibiotics being given     Status: None   Collection Time: 08/08/17  5:10 AM  Result Value Ref Range Status   Specimen Description BLOOD RIGHT HAND  Final   Special Requests IN PEDIATRIC BOTTLE Blood Culture adequate volume  Final   Culture NO GROWTH 5 DAYS  Final   Report Status 08/13/2017  FINAL  Final  MRSA PCR Screening     Status: None   Collection Time: 08/08/17  6:58 AM  Result Value Ref Range Status   MRSA by PCR NEGATIVE NEGATIVE Final    Comment:        The GeneXpert MRSA Assay (FDA approved for NASAL specimens only), is one component of a comprehensive MRSA colonization surveillance program. It is not intended to diagnose MRSA infection nor to guide or monitor treatment for MRSA infections.   Respiratory Panel by PCR     Status: Abnormal   Collection Time: 08/08/17  1:14 PM  Result Value Ref Range Status   Adenovirus NOT DETECTED NOT DETECTED Final   Coronavirus 229E NOT DETECTED NOT DETECTED Final   Coronavirus HKU1 NOT DETECTED NOT DETECTED Final   Coronavirus NL63 NOT DETECTED NOT DETECTED Final   Coronavirus OC43 DETECTED (A) NOT DETECTED Final   Metapneumovirus NOT DETECTED NOT DETECTED Final   Rhinovirus / Enterovirus NOT DETECTED NOT DETECTED Final   Influenza A NOT DETECTED NOT DETECTED Final   Influenza B NOT DETECTED NOT DETECTED Final   Parainfluenza Virus 1 NOT DETECTED NOT DETECTED Final   Parainfluenza Virus 2 NOT DETECTED NOT DETECTED Final   Parainfluenza Virus 3 NOT DETECTED NOT DETECTED Final   Parainfluenza Virus 4 NOT DETECTED NOT DETECTED Final   Respiratory Syncytial Virus NOT DETECTED NOT  DETECTED Final   Bordetella pertussis NOT DETECTED NOT DETECTED Final   Chlamydophila pneumoniae NOT DETECTED NOT DETECTED Final   Mycoplasma pneumoniae NOT DETECTED NOT DETECTED Final      Radiology Studies: No results found.    Scheduled Meds: . amoxicillin-clavulanate  1 tablet Oral Q12H  . arformoterol  15 mcg Nebulization BID  . budesonide (PULMICORT) nebulizer solution  0.5 mg Nebulization BID  . buPROPion  150 mg Oral Daily  . DULoxetine  60 mg Oral Daily  . enoxaparin (LOVENOX) injection  40 mg Subcutaneous Daily  . feeding supplement  1 Container Oral TID BM  . ferrous sulfate  325 mg Oral BID WC  . ipratropium  0.5 mg Nebulization TID  . levalbuterol  1.25 mg Nebulization TID  . lisinopril  5 mg Oral Daily  . methylPREDNISolone (SOLU-MEDROL) injection  60 mg Intravenous Q12H  . multivitamin with minerals  1 tablet Oral Daily  . triamterene-hydrochlorothiazide  0.5 tablet Oral Daily   Continuous Infusions:   LOS: 6 days    Time spent: Total of 20 minutes spent with pt, greater than 50% of which was spent in discussion of  treatment, counseling and coordination of care   Penny Pia, MD Pager: Text Page via [email or web address]   If 7PM-7AM, please contact night-coverage [email or web address] 08/14/2017, 5:19 PM

## 2017-08-15 ENCOUNTER — Encounter: Payer: Self-pay | Admitting: Specialist

## 2017-08-15 LAB — BASIC METABOLIC PANEL
ANION GAP: 9 (ref 5–15)
BUN: 35 mg/dL — AB (ref 6–20)
CALCIUM: 9.7 mg/dL (ref 8.9–10.3)
CO2: 30 mmol/L (ref 22–32)
Chloride: 100 mmol/L — ABNORMAL LOW (ref 101–111)
Creatinine, Ser: 0.76 mg/dL (ref 0.44–1.00)
GFR calc Af Amer: >60 mL/min (ref >60)
GLUCOSE: 87 mg/dL (ref 65–99)
Potassium: 4.8 mmol/L (ref 3.5–5.1)
Sodium: 139 mmol/L (ref 135–145)

## 2017-08-15 LAB — CBC
HCT: 30.1 % — ABNORMAL LOW (ref 36.0–46.0)
Hemoglobin: 8.6 g/dL — ABNORMAL LOW (ref 12.0–15.0)
MCH: 19.1 pg — AB (ref 26.0–34.0)
MCHC: 28.6 g/dL — AB (ref 30.0–36.0)
MCV: 66.7 fL — AB (ref 78.0–100.0)
Platelets: 328 10*3/uL (ref 150–400)
RBC: 4.51 MIL/uL (ref 3.87–5.11)
RDW: 19.5 % — AB (ref 11.5–15.5)
WBC: 15 10*3/uL — ABNORMAL HIGH (ref 4.0–10.5)

## 2017-08-15 MED ORDER — BUPROPION HCL ER (XL) 150 MG PO TB24: 150.0000 mg | ORAL_TABLET | Freq: Every day | ORAL | 0 refills | Status: DC

## 2017-08-15 MED ORDER — PREDNISONE 10 MG PO TABS: 10.0000 mg | ORAL_TABLET | ORAL | 0 refills | Status: DC

## 2017-08-15 MED ORDER — AMOXICILLIN-POT CLAVULANATE 875-125 MG PO TABS: 1.0000 | ORAL_TABLET | Freq: Two times a day (BID) | ORAL | 0 refills | Status: DC

## 2017-08-15 MED ORDER — ALBUTEROL SULFATE HFA 108 (90 BASE) MCG/ACT IN AERS: 1.0000 | INHALATION_SPRAY | Freq: Four times a day (QID) | RESPIRATORY_TRACT | 0 refills | Status: DC | PRN

## 2017-08-15 NOTE — Discharge Summary (Signed)
Physician Discharge Summary  Vanessa Wallace JXB:[id number] DOB: January 23, 1956 DOA: 08/07/2017  PCP: System, Provider Not In  Admit date: 08/07/2017 Discharge date: 08/15/2017  Time spent: > 35 minutes  Recommendations for Outpatient Follow-up:  1. Please be sure to encourage tobacco use cessation 2. Pt required supplemental oxygen on d/c. Assess for needs moving forward with improvement in condition   Discharge Diagnoses:  Principal Problem:   COPD exacerbation (HCC) Active Problems:   Cocaine use   Asthma exacerbation   Protein-calorie malnutrition, moderate (HCC)   Iron deficiency anemia   Depression   Acute on chronic respiratory failure with hypoxia (HCC)   Protein-calorie malnutrition, severe   Discharge Condition: stable  Diet recommendation: Heart healthy  Filed Weights   08/08/17 0700 08/11/17 2157  Weight: 51.6 kg (113 lb 12.1 oz) 43.7 kg (96 lb 5.5 oz)    History of present illness:  62yoF with hx COPD, HTN, Migraines, Anemia, Cocaine and Marijuana abuse, presents to the ER tonight with COPD exacerbation  Hospital Course:  Acute respiratory failure with hypoxia and hypercapnia due to COPD exacerbation due to viral infection (Coronavirus) with superimposed ? Bacterial pneumonia   On Augmentin to complete 7 days total CTA negative for PE  Discharge on prednisone taper  Anxiety/Panic Attacks Please do not treat panic attacks with BiPAP if patient is not desating try conservative measures first She is max out on Cymbalta, added Wellbutrin 150 mg daily  Hypertension BP stable  Continue current regimen  Avoid beta-blockers as patient is a cocaine user  Chronic anemia - Iron deficiency  Hgb stable   Acute metabolic encephalopathy - resolved Due to hypercapnic respiratory failure  Procedures:  none  Consultations:  None  Discharge Exam: Vitals:   08/14/17 2240 08/15/17 0543  BP: (!) 150/81 137/87  Pulse: 97 74  Resp: 18 16  Temp: 98.9 F (37.2  C) 98.9 F (37.2 C)  SpO2: 94% 93%    General: Pt in nad, alert and awake Cardiovascular: rrr, no rubs Respiratory: no increased wob, no wheezes  Discharge Instructions   Discharge Instructions    Call MD for:  difficulty breathing, headache or visual disturbances   Complete by:  As directed    Call MD for:  temperature >100.4   Complete by:  As directed    Diet - low sodium heart healthy   Complete by:  As directed    Discharge instructions   Complete by:  As directed    Please avoid smoking while on supplemental oxygen or all together. Follow up with your primary care physician or pulmonologist.   Increase activity slowly   Complete by:  As directed      Allergies as of 08/15/2017   No Known Allergies     Medication List    TAKE these medications   albuterol (2.5 MG/3ML) 0.083% nebulizer solution Commonly known as:  PROVENTIL Take 2.5 mg by nebulization every 6 (six) hours as needed for wheezing or shortness of breath.   albuterol 108 (90 Base) MCG/ACT inhaler Commonly known as:  PROVENTIL HFA;VENTOLIN HFA Inhale 1-2 puffs into the lungs every 6 (six) hours as needed for wheezing or shortness of breath.   amoxicillin-clavulanate 875-125 MG tablet Commonly known as:  AUGMENTIN Take 1 tablet by mouth every 12 (twelve) hours.   budesonide-formoterol 160-4.5 MCG/ACT inhaler Commonly known as:  SYMBICORT Inhale 2 puffs into the lungs 2 (two) times daily.   buPROPion 150 MG 24 hr tablet Commonly known as:  WELLBUTRIN XL  Take 1 tablet (150 mg total) by mouth daily. Start taking on:  08/16/2017   DULoxetine 60 MG capsule Commonly known as:  CYMBALTA Take 60 mg by mouth daily.   ferrous sulfate 325 (65 FE) MG tablet Take 1 tablet (325 mg total) by mouth daily with breakfast.   HYDROcodone-acetaminophen 7.5-325 MG tablet Commonly known as:  NORCO Take 1 tablet by mouth every 4 (four) hours as needed for moderate pain.   lisinopril 5 MG tablet Commonly known as:   PRINIVIL,ZESTRIL Take 5 mg by mouth daily.   montelukast 10 MG tablet Commonly known as:  SINGULAIR Take 10 mg by mouth at bedtime.   predniSONE 10 MG tablet Commonly known as:  DELTASONE Take 1 tablet (10 mg total) by mouth as directed. Take 40 mg for the next 2 days, then 30 mg po for the next 2 days, then take 20 mg po daily for the next 2 days, then take 10 mg po for the next 2 days. What changed:    medication strength  how much to take  when to take this  additional instructions   triamterene-hydrochlorothiazide 37.5-25 MG tablet Commonly known as:  MAXZIDE-25 Take 0.5 tablets by mouth daily.            Durable Medical Equipment  (From admission, onward)        Start     Ordered   08/15/17 1500  DME Oxygen  Once    Question Answer Comment  Mode or (Route) Nasal cannula   Liters per Minute 2   Frequency Continuous (stationary and portable oxygen unit needed)   Oxygen delivery system Gas      08/15/17 1500     No Known Allergies    The results of significant diagnostics from this hospitalization (including imaging, microbiology, ancillary and laboratory) are listed below for reference.    Significant Diagnostic Studies: Ct Head Wo Contrast  Result Date: 08/08/2017 CLINICAL DATA:  Worsening mental status with somnolence. EXAM: CT HEAD WITHOUT CONTRAST TECHNIQUE: Contiguous axial images were obtained from the base of the skull through the vertex without intravenous contrast. COMPARISON:  08/30/2016 FINDINGS: Brain: The brain shows generalized atrophy. No focal abnormality is seen affecting the brainstem or cerebellum. Within the cerebral hemispheres, there are areas of infarction in both thalami. The left is unchanged since 2018 in the right is newly seen, but favored to be nonacute. Moderate chronic small-vessel ischemic changes are present affecting the white matter. No cortical or large vessel territory infarction. No mass lesion, hemorrhage, hydrocephalus  or extra-axial collection. Vascular: There is atherosclerotic calcification of the major vessels at the base of the brain. Skull: Normal Sinuses/Orbits: Clear/normal Other: None IMPRESSION: No acute finding by CT. Moderate chronic small-vessel ischemic changes affecting the cerebral hemispheric white matter. Bilateral thalamic infarctions. The left is definitely old on the right is favored to be old. Electronically Signed   By: Paulina Fusi M.D.   On: 08/08/2017 07:23   Ct Angio Chest Pe W Or Wo Contrast  Result Date: 08/12/2017 CLINICAL DATA:  Shortness of breath. EXAM: CT ANGIOGRAPHY CHEST WITH CONTRAST TECHNIQUE: Multidetector CT imaging of the chest was performed using the standard protocol during bolus administration of intravenous contrast. Multiplanar CT image reconstructions and MIPs were obtained to evaluate the vascular anatomy. CONTRAST:  ISOVUE-370 IOPAMIDOL (ISOVUE-370) INJECTION 76% COMPARISON:  None. FINDINGS: Cardiovascular: Normal heart size. Aortic atherosclerosis. Calcification within the LAD, RCA and left circumflex coronary arteries. No pericardial effusion. The main pulmonary artery  is patent. No saddle embolus. No lobar or segmental pulmonary artery filling defects identified Mediastinum/Nodes: The trachea appears patent and is midline. Normal appearance of the esophagus. No mediastinal adenopathy. Bilateral enlarged hilar lymph nodes identified. Index left hilar node measures 1.3 cm, image 144 of series 7. Right hilar node measures 1.9 cm, image 127 of series 2. Lungs/Pleura: No pleural effusion. Moderate to advanced changes of emphysema. Subpleural airspace consolidation within the posterior and posteromedial left lower lobe is identified, image 108 of series 6. Mild subpleural consolidation within the posterior and posteromedial right lower lobe noted, image 90 of series 6. Upper Abdomen: There is reflux of contrast material from the right atrium into the hepatic veins. No acute  abnormality noted within the upper abdomen. Musculoskeletal: T7 compression deformity is age indeterminate with loss of approximately 20% of the superior endplate. No suspicious bone lesions identified. Review of the MIP images confirms the above findings. IMPRESSION: 1. No evidence for acute pulmonary embolus. 2. Bilateral lower lobe subpleural consolidation is identified which may represent multifocal pneumonia and/or aspiration. 3. Aortic Atherosclerosis (ICD10-I70.0) and Emphysema (ICD10-J43.9). 4. Three vessel coronary artery calcifications noted. 5. Age-indeterminate T7 compression deformity. Electronically Signed   By: Signa Kell M.D.   On: 08/12/2017 12:56   Dg Chest Port 1 View  Result Date: 08/11/2017 CLINICAL DATA:  Respiratory distress. Patient is on BiPAP. Desaturations. History of COPD. EXAM: PORTABLE CHEST 1 VIEW COMPARISON:  08/07/2017 FINDINGS: Diffuse emphysematous changes in the lungs. Scattered interstitial pattern with peribronchial thickening suggesting chronic bronchitis. No airspace disease or consolidation. No blunting of costophrenic angles. No pneumothorax. Normal heart size and pulmonary vascularity. Calcification of the aorta. IMPRESSION: Emphysematous and chronic bronchitic changes in the lungs. No evidence of active pulmonary disease. Aortic atherosclerosis. Electronically Signed   By: Burman Nieves M.D.   On: 08/11/2017 22:32   Dg Chest Portable 1 View  Result Date: 08/07/2017 CLINICAL DATA:  Pt BIB [facility] on CPAP, pt from home with increased shortness of breath starting this morning. Hx COPD. Pt currently on BIPAP machine. EXAM: PORTABLE CHEST 1 VIEW COMPARISON:  08/31/2016 FINDINGS: Atherosclerotic calcification of the aortic arch. Emphysema noted. Large lung volumes. The lungs appear otherwise clear. No blunting of the costophrenic angles. IMPRESSION: 1. Tapering of the peripheral pulmonary vasculature favors emphysema. 2.  Aortic Atherosclerosis (ICD10-I70.0).  Electronically Signed   By: Gaylyn Rong M.D.   On: 08/07/2017 18:52    Microbiology: Recent Results (from the past 240 hour(s))  Culture, blood (routine x 2) Call MD if unable to obtain prior to antibiotics being given     Status: None   Collection Time: 08/08/17  5:00 AM  Result Value Ref Range Status   Specimen Description BLOOD RIGHT HAND  Final   Special Requests   Final    BOTTLES DRAWN AEROBIC AND ANAEROBIC Blood Culture adequate volume   Culture NO GROWTH 5 DAYS  Final   Report Status 08/13/2017 FINAL  Final  Culture, blood (routine x 2) Call MD if unable to obtain prior to antibiotics being given     Status: None   Collection Time: 08/08/17  5:10 AM  Result Value Ref Range Status   Specimen Description BLOOD RIGHT HAND  Final   Special Requests IN PEDIATRIC BOTTLE Blood Culture adequate volume  Final   Culture NO GROWTH 5 DAYS  Final   Report Status 08/13/2017 FINAL  Final  MRSA PCR Screening     Status: None   Collection Time: 08/08/17  6:58  AM  Result Value Ref Range Status   MRSA by PCR NEGATIVE NEGATIVE Final    Comment:        The GeneXpert MRSA Assay (FDA approved for NASAL specimens only), is one component of a comprehensive MRSA colonization surveillance program. It is not intended to diagnose MRSA infection nor to guide or monitor treatment for MRSA infections.   Respiratory Panel by PCR     Status: Abnormal   Collection Time: 08/08/17  1:14 PM  Result Value Ref Range Status   Adenovirus NOT DETECTED NOT DETECTED Final   Coronavirus 229E NOT DETECTED NOT DETECTED Final   Coronavirus HKU1 NOT DETECTED NOT DETECTED Final   Coronavirus NL63 NOT DETECTED NOT DETECTED Final   Coronavirus OC43 DETECTED (A) NOT DETECTED Final   Metapneumovirus NOT DETECTED NOT DETECTED Final   Rhinovirus / Enterovirus NOT DETECTED NOT DETECTED Final   Influenza A NOT DETECTED NOT DETECTED Final   Influenza B NOT DETECTED NOT DETECTED Final   Parainfluenza Virus 1 NOT  DETECTED NOT DETECTED Final   Parainfluenza Virus 2 NOT DETECTED NOT DETECTED Final   Parainfluenza Virus 3 NOT DETECTED NOT DETECTED Final   Parainfluenza Virus 4 NOT DETECTED NOT DETECTED Final   Respiratory Syncytial Virus NOT DETECTED NOT DETECTED Final   Bordetella pertussis NOT DETECTED NOT DETECTED Final   Chlamydophila pneumoniae NOT DETECTED NOT DETECTED Final   Mycoplasma pneumoniae NOT DETECTED NOT DETECTED Final     Labs: Basic Metabolic Panel: Recent Labs  Lab 08/10/17 0845 08/12/17 0207 08/13/17 0804 08/14/17 0550 08/15/17 0629  NA 139 137 137 138 139  K 4.5 4.9 5.4* 5.1 4.8  CL 100* 96* 97* 100* 100*  CO2 27 29 30 29 30   GLUCOSE 99 96 119* 82 87  BUN 20 33* 46* 44* 35*  CREATININE 0.83 1.00 1.00 0.89 0.76  CALCIUM 9.0 9.2 9.8 9.4 9.7   Liver Function Tests: No results for input(s): AST, ALT, ALKPHOS, BILITOT, PROT, ALBUMIN in the last 168 hours. No results for input(s): LIPASE, AMYLASE in the last 168 hours. No results for input(s): AMMONIA in the last 168 hours. CBC: Recent Labs  Lab 08/10/17 0845 08/12/17 0207 08/13/17 0804 08/14/17 0550 08/15/17 0629  WBC 10.2 15.7* 20.5* 19.8* 15.0*  NEUTROABS  --   --  19.5* 16.7*  --   HGB 8.0* 8.5* 9.0* 8.3* 8.6*  HCT 29.0* 30.4* 31.2* 28.9* 30.1*  MCV 68.2* 67.0* 66.4* 66.7* 66.7*  PLT 308 320 303 314 328   Cardiac Enzymes: No results for input(s): CKTOTAL, CKMB, CKMBINDEX, TROPONINI in the last 168 hours. BNP: BNP (last 3 results) No results for input(s): BNP in the last 8760 hours.  ProBNP (last 3 results) No results for input(s): PROBNP in the last 8760 hours.  CBG: Recent Labs  Lab 08/08/17 1610  GLUCAP 191*       Signed:  Penny Piarlando Noeli Lavery MD.  [facility] 08/15/2017, 3:00 PM

## 2017-08-15 NOTE — Progress Notes (Signed)
Discharge instructions discussed and reviewed with pt and son at bedside, verbalized understanding. Copy of AVS given as well. Pt refused to take oxygen at home. CM aware. Pt was escorted out of the unit in wheelchair. Took all belongings with them.

## 2017-08-15 NOTE — Care Management Note (Addendum)
Case Management Note  Patient Details  Name: Eliot FordVivian A Kerman MRN: [id number] Date of Birth: 03/10/1956  Expected Discharge Date:  08/15/17               Expected Discharge Plan:  Home/Self Care  Discharge planning Services  CM Consult  Post Acute Care Choice:  Durable Medical Equipment Choice offered to:  Patient, Adult Children  DME Arranged:  Patient refused services(oxygen) DME Agency:  NA  Status of Service:  Completed, signed off  Additional Comments: Patient with discharge orders for today.  In to speak with patient, son/friend at bedside. Permission given to speak in front of son and friend. Discussed recommendation for DME: oxygen.  Patient refused does not think she needs; states she will get it later if she changes her mind she will get it.  Encouraged patient to reconsider her decision.  Also informed PCP can order if she wants to reconsider post discharge, patient verbalized understanding.  Notified Dr. Cena BentonVega patient has refused order for oxygen  Yancey FlemingsKimberly R Becton, RN  5[employer] Nurse case manager [phone] 08/15/2017, 3:36 PM

## 2017-09-25 ENCOUNTER — Institutional Professional Consult (permissible substitution): Payer: Medicaid Other | Admitting: Internal Medicine

## 2017-11-08 ENCOUNTER — Institutional Professional Consult (permissible substitution): Payer: Medicaid Other | Admitting: Internal Medicine

## 2018-01-21 ENCOUNTER — Institutional Professional Consult (permissible substitution): Payer: Medicaid Other | Admitting: Pulmonary Disease

## 2018-03-25 ENCOUNTER — Inpatient Hospital Stay (HOSPITAL_COMMUNITY)
Admission: EM | Admit: 2018-03-25 | Discharge: 2018-03-29 | DRG: 291 | Disposition: A | Discharge status: Home / Self Care | Payer: Medicaid Other | Attending: Nephrology | Admitting: Nephrology

## 2018-03-25 ENCOUNTER — Other Ambulatory Visit: Payer: Self-pay

## 2018-03-25 ENCOUNTER — Emergency Department (HOSPITAL_COMMUNITY): Payer: Medicaid Other

## 2018-03-25 ENCOUNTER — Encounter (HOSPITAL_COMMUNITY): Payer: Self-pay

## 2018-03-25 DIAGNOSIS — G8929 Other chronic pain: Secondary | ICD-10-CM | POA: Diagnosis present

## 2018-03-25 DIAGNOSIS — J9621 Acute and chronic respiratory failure with hypoxia: Secondary | ICD-10-CM | POA: Diagnosis not present

## 2018-03-25 DIAGNOSIS — F32A Depression, unspecified: Secondary | ICD-10-CM | POA: Diagnosis present

## 2018-03-25 DIAGNOSIS — Z79899 Other long term (current) drug therapy: Secondary | ICD-10-CM | POA: Diagnosis not present

## 2018-03-25 DIAGNOSIS — J4541 Moderate persistent asthma with (acute) exacerbation: Secondary | ICD-10-CM | POA: Diagnosis not present

## 2018-03-25 DIAGNOSIS — Z716 Tobacco abuse counseling: Secondary | ICD-10-CM

## 2018-03-25 DIAGNOSIS — K761 Chronic passive congestion of liver: Secondary | ICD-10-CM | POA: Diagnosis present

## 2018-03-25 DIAGNOSIS — R0603 Acute respiratory distress: Secondary | ICD-10-CM

## 2018-03-25 DIAGNOSIS — Z66 Do not resuscitate: Secondary | ICD-10-CM | POA: Diagnosis present

## 2018-03-25 DIAGNOSIS — R0600 Dyspnea, unspecified: Secondary | ICD-10-CM | POA: Diagnosis not present

## 2018-03-25 DIAGNOSIS — J449 Chronic obstructive pulmonary disease, unspecified: Secondary | ICD-10-CM | POA: Diagnosis present

## 2018-03-25 DIAGNOSIS — F329 Major depressive disorder, single episode, unspecified: Secondary | ICD-10-CM | POA: Diagnosis present

## 2018-03-25 DIAGNOSIS — I5031 Acute diastolic (congestive) heart failure: Secondary | ICD-10-CM | POA: Diagnosis not present

## 2018-03-25 DIAGNOSIS — J9601 Acute respiratory failure with hypoxia: Secondary | ICD-10-CM | POA: Diagnosis present

## 2018-03-25 DIAGNOSIS — J441 Chronic obstructive pulmonary disease with (acute) exacerbation: Secondary | ICD-10-CM | POA: Diagnosis not present

## 2018-03-25 DIAGNOSIS — Z7151 Drug abuse counseling and surveillance of drug abuser: Secondary | ICD-10-CM

## 2018-03-25 DIAGNOSIS — I509 Heart failure, unspecified: Secondary | ICD-10-CM | POA: Diagnosis not present

## 2018-03-25 DIAGNOSIS — I10 Essential (primary) hypertension: Secondary | ICD-10-CM | POA: Diagnosis not present

## 2018-03-25 DIAGNOSIS — Z7982 Long term (current) use of aspirin: Secondary | ICD-10-CM

## 2018-03-25 DIAGNOSIS — J9622 Acute and chronic respiratory failure with hypercapnia: Secondary | ICD-10-CM | POA: Diagnosis not present

## 2018-03-25 DIAGNOSIS — R64 Cachexia: Secondary | ICD-10-CM | POA: Diagnosis present

## 2018-03-25 DIAGNOSIS — Z681 Body mass index (BMI) 19 or less, adult: Secondary | ICD-10-CM

## 2018-03-25 DIAGNOSIS — I5021 Acute systolic (congestive) heart failure: Secondary | ICD-10-CM | POA: Diagnosis present

## 2018-03-25 DIAGNOSIS — F17210 Nicotine dependence, cigarettes, uncomplicated: Secondary | ICD-10-CM | POA: Diagnosis present

## 2018-03-25 DIAGNOSIS — J45901 Unspecified asthma with (acute) exacerbation: Secondary | ICD-10-CM | POA: Diagnosis present

## 2018-03-25 DIAGNOSIS — I272 Pulmonary hypertension, unspecified: Secondary | ICD-10-CM | POA: Diagnosis present

## 2018-03-25 DIAGNOSIS — F410 Panic disorder [episodic paroxysmal anxiety] without agoraphobia: Secondary | ICD-10-CM | POA: Diagnosis present

## 2018-03-25 DIAGNOSIS — I110 Hypertensive heart disease with heart failure: Principal | ICD-10-CM | POA: Diagnosis present

## 2018-03-25 DIAGNOSIS — Z7951 Long term (current) use of inhaled steroids: Secondary | ICD-10-CM

## 2018-03-25 DIAGNOSIS — E8770 Fluid overload, unspecified: Secondary | ICD-10-CM | POA: Diagnosis not present

## 2018-03-25 DIAGNOSIS — R0902 Hypoxemia: Secondary | ICD-10-CM

## 2018-03-25 DIAGNOSIS — Z90710 Acquired absence of both cervix and uterus: Secondary | ICD-10-CM | POA: Diagnosis not present

## 2018-03-25 DIAGNOSIS — F1490 Cocaine use, unspecified, uncomplicated: Secondary | ICD-10-CM | POA: Diagnosis not present

## 2018-03-25 DIAGNOSIS — E8779 Other fluid overload: Secondary | ICD-10-CM | POA: Diagnosis not present

## 2018-03-25 DIAGNOSIS — F1410 Cocaine abuse, uncomplicated: Secondary | ICD-10-CM | POA: Diagnosis present

## 2018-03-25 DIAGNOSIS — M7989 Other specified soft tissue disorders: Secondary | ICD-10-CM | POA: Diagnosis not present

## 2018-03-25 HISTORY — DX: Cocaine abuse, uncomplicated: F14.10

## 2018-03-25 HISTORY — DX: Anxiety disorder, unspecified: F41.9

## 2018-03-25 LAB — COMPREHENSIVE METABOLIC PANEL
ALT: 66 U/L — ABNORMAL HIGH (ref 0–44)
AST: 53 U/L — ABNORMAL HIGH (ref 15–41)
Albumin: 3.4 g/dL — ABNORMAL LOW (ref 3.5–5.0)
Alkaline Phosphatase: 67 U/L (ref 38–126)
Anion gap: 8 (ref 5–15)
BUN: 25 mg/dL — ABNORMAL HIGH (ref 6–20)
CO2: 30 mmol/L (ref 22–32)
Calcium: 8.9 mg/dL (ref 8.9–10.3)
Chloride: 104 mmol/L (ref 98–111)
Creatinine, Ser: 1.14 mg/dL — ABNORMAL HIGH (ref 0.44–1.00)
GFR calc Af Amer: 59 mL/min — ABNORMAL LOW (ref >60)
GFR calc non Af Amer: 51 mL/min — ABNORMAL LOW (ref >60)
Glucose, Bld: 90 mg/dL (ref 70–99)
Potassium: 3.8 mmol/L (ref 3.5–5.1)
Sodium: 142 mmol/L (ref 135–145)
Total Bilirubin: 0.5 mg/dL (ref 0.3–1.2)
Total Protein: 6.9 g/dL (ref 6.5–8.1)

## 2018-03-25 LAB — CBC WITH DIFFERENTIAL/PLATELET
Basophils Absolute: 0 10*3/uL (ref 0.0–0.1)
Basophils Relative: 0 %
Eosinophils Absolute: 0.1 10*3/uL (ref 0.0–0.7)
Eosinophils Relative: 1 %
HCT: 44.4 % (ref 36.0–46.0)
Hemoglobin: 14.3 g/dL (ref 12.0–15.0)
Lymphocytes Relative: 37 %
Lymphs Abs: 2.1 10*3/uL (ref 0.7–4.0)
MCH: 30 pg (ref 26.0–34.0)
MCHC: 32.2 g/dL (ref 30.0–36.0)
MCV: 93.1 fL (ref 78.0–100.0)
Monocytes Absolute: 0.4 10*3/uL (ref 0.1–1.0)
Monocytes Relative: 7 %
Neutro Abs: 3.2 10*3/uL (ref 1.7–7.7)
Neutrophils Relative %: 55 %
Platelets: 306 10*3/uL (ref 150–400)
RBC: 4.77 MIL/uL (ref 3.87–5.11)
RDW: 13.9 % (ref 11.5–15.5)
WBC: 5.8 10*3/uL (ref 4.0–10.5)

## 2018-03-25 LAB — I-STAT CHEM 8, ED
BUN: 25 mg/dL — ABNORMAL HIGH (ref 6–20)
Calcium, Ion: 1.21 mmol/L (ref 1.15–1.40)
Chloride: 103 mmol/L (ref 98–111)
Creatinine, Ser: 1.2 mg/dL — ABNORMAL HIGH (ref 0.44–1.00)
Glucose, Bld: 87 mg/dL (ref 70–99)
HCT: 46 % (ref 36.0–46.0)
Hemoglobin: 15.6 g/dL — ABNORMAL HIGH (ref 12.0–15.0)
Potassium: 3.6 mmol/L (ref 3.5–5.1)
Sodium: 142 mmol/L (ref 135–145)
TCO2: 28 mmol/L (ref 22–32)

## 2018-03-25 LAB — BRAIN NATRIURETIC PEPTIDE: B Natriuretic Peptide: 1063.9 pg/mL — ABNORMAL HIGH (ref 0.0–100.0)

## 2018-03-25 LAB — TROPONIN I: TROPONIN I: 0.03 ng/mL — AB (ref <0.03)

## 2018-03-25 LAB — RAPID URINE DRUG SCREEN, HOSP PERFORMED
Amphetamines: NOT DETECTED
Barbiturates: NOT DETECTED
Benzodiazepines: NOT DETECTED
Cocaine: POSITIVE — AB
Opiates: NOT DETECTED
Tetrahydrocannabinol: NOT DETECTED

## 2018-03-25 MED ORDER — PANTOPRAZOLE SODIUM 40 MG PO TBEC
40.0000 mg | DELAYED_RELEASE_TABLET | Freq: Every day | ORAL | Status: DC
  Administered 2018-03-26 – 2018-03-29 (×4): 40 mg via ORAL
  Filled 2018-03-25 (×4): qty 1

## 2018-03-25 MED ORDER — ALBUTEROL SULFATE (2.5 MG/3ML) 0.083% IN NEBU
5.0000 mg | INHALATION_SOLUTION | Freq: Once | RESPIRATORY_TRACT | Status: DC
  Filled 2018-03-25: qty 6

## 2018-03-25 MED ORDER — LISINOPRIL 5 MG PO TABS
5.0000 mg | ORAL_TABLET | Freq: Every day | ORAL | Status: DC
  Administered 2018-03-26: 5 mg via ORAL
  Filled 2018-03-25: qty 1

## 2018-03-25 MED ORDER — FUROSEMIDE 10 MG/ML IJ SOLN
40.0000 mg | Freq: Two times a day (BID) | INTRAMUSCULAR | Status: DC
  Administered 2018-03-26 – 2018-03-27 (×3): 40 mg via INTRAVENOUS
  Filled 2018-03-25 (×3): qty 4

## 2018-03-25 MED ORDER — IPRATROPIUM-ALBUTEROL 0.5-2.5 (3) MG/3ML IN SOLN
3.0000 mL | Freq: Three times a day (TID) | RESPIRATORY_TRACT | Status: DC
  Administered 2018-03-26 – 2018-03-28 (×8): 3 mL via RESPIRATORY_TRACT
  Filled 2018-03-25 (×8): qty 3

## 2018-03-25 MED ORDER — CLONAZEPAM 0.5 MG PO TABS: 0.5000 mg | ORAL_TABLET | Freq: Two times a day (BID) | ORAL | Status: DC | PRN

## 2018-03-25 MED ORDER — ASPIRIN EC 81 MG PO TBEC
81.0000 mg | DELAYED_RELEASE_TABLET | Freq: Every day | ORAL | Status: DC
  Administered 2018-03-26 – 2018-03-29 (×4): 81 mg via ORAL
  Filled 2018-03-25 (×4): qty 1

## 2018-03-25 MED ORDER — IPRATROPIUM-ALBUTEROL 0.5-2.5 (3) MG/3ML IN SOLN
3.0000 mL | Freq: Three times a day (TID) | RESPIRATORY_TRACT | Status: DC
  Administered 2018-03-25: 3 mL via RESPIRATORY_TRACT
  Filled 2018-03-25: qty 3

## 2018-03-25 MED ORDER — ACETAMINOPHEN 650 MG RE SUPP
650.0000 mg | Freq: Four times a day (QID) | RECTAL | Status: DC | PRN
  Filled 2018-03-25: qty 1

## 2018-03-25 MED ORDER — METHYLPREDNISOLONE SODIUM SUCC 125 MG IJ SOLR
80.0000 mg | Freq: Once | INTRAMUSCULAR | Status: AC
  Administered 2018-03-25: 80 mg via INTRAVENOUS
  Filled 2018-03-25: qty 2

## 2018-03-25 MED ORDER — FUROSEMIDE 10 MG/ML IJ SOLN
40.0000 mg | Freq: Once | INTRAMUSCULAR | Status: AC
  Administered 2018-03-25: 40 mg via INTRAVENOUS
  Filled 2018-03-25: qty 4

## 2018-03-25 MED ORDER — MONTELUKAST SODIUM 10 MG PO TABS
10.0000 mg | ORAL_TABLET | Freq: Every day | ORAL | Status: DC
  Administered 2018-03-26 – 2018-03-28 (×3): 10 mg via ORAL
  Filled 2018-03-25 (×3): qty 1

## 2018-03-25 MED ORDER — ENOXAPARIN SODIUM 30 MG/0.3ML ~~LOC~~ SOLN
30.0000 mg | Freq: Every day | SUBCUTANEOUS | Status: DC
  Filled 2018-03-25: qty 0.3

## 2018-03-25 MED ORDER — DULOXETINE HCL 60 MG PO CPEP
60.0000 mg | ORAL_CAPSULE | Freq: Every day | ORAL | Status: DC
  Administered 2018-03-26 – 2018-03-29 (×4): 60 mg via ORAL
  Filled 2018-03-25 (×4): qty 1

## 2018-03-25 MED ORDER — ACETAMINOPHEN 325 MG PO TABS
650.0000 mg | ORAL_TABLET | Freq: Four times a day (QID) | ORAL | Status: DC | PRN
  Administered 2018-03-27: 650 mg via ORAL
  Filled 2018-03-25: qty 2

## 2018-03-25 MED ORDER — IPRATROPIUM-ALBUTEROL 0.5-2.5 (3) MG/3ML IN SOLN
3.0000 mL | Freq: Once | RESPIRATORY_TRACT | Status: AC
  Administered 2018-03-25: 3 mL via RESPIRATORY_TRACT
  Filled 2018-03-25: qty 3

## 2018-03-25 MED ORDER — POLYETHYLENE GLYCOL 3350 17 G PO PACK
17.0000 g | PACK | Freq: Every day | ORAL | Status: DC | PRN
  Filled 2018-03-25: qty 1

## 2018-03-25 MED ORDER — MOMETASONE FURO-FORMOTEROL FUM 200-5 MCG/ACT IN AERO
2.0000 | INHALATION_SPRAY | Freq: Two times a day (BID) | RESPIRATORY_TRACT | Status: DC
  Administered 2018-03-26: 2 via RESPIRATORY_TRACT
  Filled 2018-03-25: qty 8.8

## 2018-03-25 NOTE — Progress Notes (Signed)
Lovenox per Pharmacy for DVT Prophylaxis    Pharmacy has been consulted from dosing enoxaparin (lovenox) in this patient for DVT prophylaxis.  The pharmacist has reviewed pertinent labs (Hgb _14.3__; PLT_306__), patient weight (_44.9__kg) and renal function (CrCl_35__mL/min) and decided that enoxaparin 30__mg SQ Q24Hrs is appropriate for this patient.  The pharmacy department will sign off at this time.  Please reconsult pharmacy if status changes or for further issues.  Thank you  Luetta Nutting PharmD, BCPS  03/25/2018, 11:13 PM

## 2018-03-25 NOTE — ED Notes (Addendum)
Pt increased to 4L of O2 after returning from bathroom. O2 sat was 85 on room air, and 87 after being put back on 2L. Currently sat is 98

## 2018-03-25 NOTE — ED Notes (Signed)
Admit Provider at bedside. 

## 2018-03-25 NOTE — ED Triage Notes (Signed)
Pt arrived by POV from home. Pt reports that she started feeling short of breath a couple hours prior to arrival. Pt has hx of asthma. Pt attempted to use inhaler and breathing treatments at home with no relief. Pt oxygen saturation at 75% on room air in triage.

## 2018-03-25 NOTE — ED Provider Notes (Signed)
[facility]-EMERGENCY DEPT Provider Note   CSN: [id number] Arrival date & time: 03/25/18  1858     History   Chief Complaint Chief Complaint  Patient presents with  . Shortness of Breath    HPI Vanessa Wallace is a 62 y.o. female with history of COPD, asthma, hypertension who presents with acute onset shortness of breath that began today.  Patient reports having a panic attack prior to arrival, but that has resolved.  Patient reports a few day history of bilateral lower extremity edema.  She denies any chest pain.  She has no history of leg swelling.  She reports she thinks her leg swelling started after she was on a medicine for ringworm.  She denies any recent long trips, surgeries, known cancer, exogenous estrogen use, history of blood clots.  Patient admits to using cocaine 2 days ago to help with her chronic pain and anxiety.  HPI  Past Medical History:  Diagnosis Date  . Anxiety   . Arthritis    "all over" (09/17/2013)  . Asthma   . CAP (community acquired pneumonia) 09/17/2013  . Chronic bronchitis (HCC)    "flares up w/my asthma" (09/17/2013)  . COPD (chronic obstructive pulmonary disease) (HCC)   . Hypertension   . Migraines    "weekly lately" (09/17/2013)    Patient Active Problem List   Diagnosis Date Noted  . Protein-calorie malnutrition, severe 08/12/2017  . Protein-calorie malnutrition, moderate (HCC) 08/08/2017  . Iron deficiency anemia 08/08/2017  . Depression 08/08/2017  . Acute on chronic respiratory failure with hypoxia (HCC) 08/08/2017  . COPD exacerbation (HCC) 08/30/2016  . COPD (chronic obstructive pulmonary disease) (HCC) 08/30/2016  . Asthma exacerbation 10/02/2014  . Renal insufficiency   . Chest wall pain   . Cough   . Cocaine use 09/22/2013  . Pneumonia 09/17/2013  . Community acquired pneumonia 09/17/2013    Past Surgical History:  Procedure Laterality Date  . ABDOMINAL HYSTERECTOMY     "partial"  . BUNIONECTOMY  Bilateral    "right one came back after OR" (09/17/2013)  . LACERATION REPAIR Left 06/1999   Repair FPL, radial digital nerve, intrinsics, left thumb./notes 06/29/1999 (09/17/2013)  . TUBAL LIGATION       OB History   None      Home Medications    Prior to Admission medications   Medication Sig Start Date End Date Taking? Authorizing Provider  albuterol (PROVENTIL HFA;VENTOLIN HFA) 108 (90 Base) MCG/ACT inhaler Inhale 1-2 puffs into the lungs every 6 (six) hours as needed for wheezing or shortness of breath. 08/15/17  Yes Penny Pia, MD  albuterol (PROVENTIL) (2.5 MG/3ML) 0.083% nebulizer solution Take 2.5 mg by nebulization every 6 (six) hours as needed for wheezing or shortness of breath.    Yes [provider]  aspirin 81 MG tablet Take 81 mg by mouth daily.   Yes [provider]  budesonide-formoterol (SYMBICORT) 160-4.5 MCG/ACT inhaler Inhale 2 puffs into the lungs 2 (two) times daily. 09/01/16  Yes [name], MD  clonazePAM (KLONOPIN) 0.5 MG tablet Take 1 tablet by mouth 2 (two) times daily as needed for anxiety. 01/31/18  Yes [provider]  DULoxetine (CYMBALTA) 60 MG capsule Take 60 mg by mouth daily. 07/22/17  Yes [provider]  ferrous sulfate 325 (65 FE) MG tablet Take 1 tablet (325 mg total) by mouth daily with breakfast. 09/01/16  Yes [name], MD  lisinopril (PRINIVIL,ZESTRIL) 5 MG tablet Take 5 mg by mouth daily. 07/13/17  Yes [provider]  montelukast (SINGULAIR) 10 MG tablet Take 10 mg by mouth at bedtime.   Yes [provider]  omeprazole (PRILOSEC) 20 MG capsule Take 20 mg by mouth daily.   Yes [provider]  amoxicillin-clavulanate (AUGMENTIN) 875-125 MG tablet Take 1 tablet by mouth every 12 (twelve) hours. Patient not taking: Reported on 03/25/2018 08/15/17   Penny Pia, MD  buPROPion (WELLBUTRIN XL) 150 MG 24 hr tablet Take 1 tablet (150 mg total) by mouth daily. Patient not  taking: Reported on 03/25/2018 08/16/17   Penny Pia, MD  predniSONE (DELTASONE) 10 MG tablet Take 1 tablet (10 mg total) by mouth as directed. Take 40 mg for the next 2 days, then 30 mg po for the next 2 days, then take 20 mg po daily for the next 2 days, then take 10 mg po for the next 2 days. Patient not taking: Reported on 03/25/2018 08/15/17   Penny Pia, MD    Family History Family History  Problem Relation Age of Onset  . Brain cancer Mother     Social History Social History   Tobacco Use  . Smoking status: Former Smoker    Packs/day: 0.30    Years: 40.00    Pack years: 12.00    Types: Cigarettes    Last attempt to quit: 09/18/2014    Years since quitting: 3.5  . Smokeless tobacco: Never Used  . Tobacco comment: 09/17/2013 "weaned down from 2 ppd of cigarettes"  Substance Use Topics  . Alcohol use: No    Alcohol/week: 6.0 standard drinks    Types: 6 Cans of beer per week    Comment: Quit 2 weeks  . Drug use: Yes    Types: Marijuana, Cocaine    Comment: 09/17/2013 " Previous history of cocaine abuse; "quit over 1 yr ago"     Allergies   Patient has no known allergies.   Review of Systems Review of Systems  Constitutional: Negative for chills and fever.  HENT: Negative for facial swelling and sore throat.   Respiratory: Positive for shortness of breath.   Cardiovascular: Positive for leg swelling. Negative for chest pain.  Gastrointestinal: Negative for abdominal pain, nausea and vomiting.  Genitourinary: Negative for dysuria.  Musculoskeletal: Negative for back pain.  Skin: Negative for rash and wound.  Neurological: Negative for headaches.  Psychiatric/Behavioral: The patient is not nervous/anxious.      Physical Exam Updated Vital Signs BP (!) 151/98 (BP Location: Right Arm)   Pulse 95   Temp 98.2 F (36.8 C) (Oral)   Resp 16   Ht 5\' 2"  (1.575 m)   Wt 44.9 kg   SpO2 98%   BMI 18.11 kg/m   Physical Exam  Constitutional: She appears well-developed  and well-nourished. No distress.  HENT:  Head: Normocephalic and atraumatic.  Mouth/Throat: Oropharynx is clear and moist. No oropharyngeal exudate.  Eyes: Pupils are equal, round, and reactive to light. Conjunctivae are normal. Right eye exhibits no discharge. Left eye exhibits no discharge. No scleral icterus.  Neck: Normal range of motion. Neck supple. No thyromegaly present.  Cardiovascular: Normal rate, regular rhythm, normal heart sounds and intact distal pulses. Exam reveals no gallop and no friction rub.  No murmur heard. Pulmonary/Chest: Effort normal. No stridor. No respiratory distress. She has decreased breath sounds. She has wheezes (end expiratory wheezes, faint, bilateral bases). She has rales (bilateral bases).  Patient unable to speak in complete sentences 100% on 2L [place], initially 74% on RA  Abdominal: Soft. Bowel sounds are normal. She exhibits no distension. There is no tenderness. There is no rebound and no guarding.  Musculoskeletal:       Right lower leg: She exhibits edema. She exhibits no tenderness.       Left lower leg: She exhibits edema. She exhibits no tenderness.  2+ pitting edema bilaterally, symmetric; no tenderness  Lymphadenopathy:    She has no cervical adenopathy.  Neurological: She is alert. Coordination normal.  Skin: Skin is warm and dry. No rash noted. She is not diaphoretic. No pallor.  Psychiatric: She has a normal mood and affect.  Nursing note and vitals reviewed.    ED Treatments / Results  Labs (all labs ordered are listed, but only abnormal results are displayed) Labs Reviewed  COMPREHENSIVE METABOLIC PANEL - Abnormal; Notable for the following components:      Result Value   BUN 25 (*)    Creatinine, Ser 1.14 (*)    Albumin 3.4 (*)    AST 53 (*)    ALT 66 (*)    GFR calc non Af Amer 51 (*)    GFR calc Af Amer 59 (*)    All other components within normal limits  BRAIN NATRIURETIC PEPTIDE - Abnormal; Notable for the following  components:   B Natriuretic Peptide 1,063.9 (*)    All other components within normal limits  TROPONIN I - Abnormal; Notable for the following components:   Troponin I 0.03 (*)    All other components within normal limits  I-STAT CHEM 8, ED - Abnormal; Notable for the following components:   BUN 25 (*)    Creatinine, Ser 1.20 (*)    Hemoglobin 15.6 (*)    All other components within normal limits  CBC WITH DIFFERENTIAL/PLATELET  RAPID URINE DRUG SCREEN, HOSP PERFORMED    EKG EKG Interpretation  Date/Time:  Tuesday March 25 2018 19:13:14 EDT Ventricular Rate:  96 PR Interval:    QRS Duration: 82 QT Interval:  342 QTC Calculation: 433 R Axis:   75 Text Interpretation:  Sinus rhythm Biatrial enlargement LVH with secondary repolarization abnormality Baseline wander in lead(s) V4 background noise TECHNICALLY DIFFICULT Otherwise no significant change Confirmed by Melene Plan [phone]) on 03/25/2018 7:25:37 PM   Radiology Dg Chest 2 View  Result Date: 03/25/2018 CLINICAL DATA:  Shortness of breath, history asthma EXAM: CHEST - 2 VIEW COMPARISON:  08/11/2017 FINDINGS: Enlargement of cardiac silhouette. Atherosclerotic calcification aorta. Mediastinal contours and pulmonary vascularity normal. Emphysematous, chronic bronchitic and chronic interstitial changes, stable. No acute infiltrate, pleural effusion or pneumothorax. Pleural based opacities at the lateral apices appear more pronounced bilaterally. Diffuse osseous demineralization. IMPRESSION: Enlargement of cardiac silhouette. Changes of COPD and chronic interstitial disease without acute infiltrate. Increased prominence of pleural based opacities at the laterally proceeds bilaterally; further evaluation by CT chest recommended to exclude biapical masses. Electronically Signed   By: Ulyses Southward M.D.   On: 03/25/2018 20:32    Procedures Procedures (including critical care time)  Medications Ordered in ED Medications  albuterol  (PROVENTIL) (2.5 MG/3ML) 0.083% nebulizer solution 5 mg (0 mg Nebulization Hold 03/25/18 1948)  ipratropium-albuterol (DUONEB) 0.5-2.5 (3) MG/3ML nebulizer solution 3 mL (3 mLs Nebulization Given 03/25/18 1946)  methylPREDNISolone sodium succinate (SOLU-MEDROL) 125 mg/2 mL injection 80 mg (80 mg Intravenous Given 03/25/18 1946)  furosemide (LASIX) injection 40 mg (40 mg Intravenous Given 03/25/18 2059)     Initial Impression / Assessment and Plan / ED Course  I have reviewed the  triage vital signs and the nursing notes.  Pertinent labs & imaging results that were available during my care of the patient were reviewed by me and considered in my medical decision making (see chart for details).     Patient with acute onset shortness of breath.  Suspect combination of new onset CHF as well as COPD.  BNP 1063.9.  Mild elevation in troponin 0 0.03.  CMP shows BUN 25, creatinine 1.14.  Chest x-ray shows enlargement of cardiac silhouette, changes of COPD and chronic interstitial disease without acute infiltrate, increased prominence of pleural-based opacities at the lateral apices, more pronounced bilaterally.  Patient given dose of Lasix in the ED as well as nebulizer treatments.  Patient also given single dose of Solu-Medrol.  Patient denies any chest pain.  I spoke with Dr. Mariea Clonts with [facility] who accepts patient for admission for further evaluation and treatment. Patient also evaluated by Dr. Adela Lank who guided the patient's management and agrees with plan.   Final Clinical Impressions(s) / ED Diagnoses   Final diagnoses:  Acute congestive heart failure, unspecified heart failure type [facility])  Acute respiratory distress  Hypoxia    ED Discharge Orders    None       Emi Holes, PA-C 03/25/18 2131    Melene Plan, DO 03/25/18 2141

## 2018-03-25 NOTE — ED Notes (Signed)
Date and time results received: 03/25/18  "8:24 PM (use smartphrase ".now" to insert current time)  Test: Troponin Critical Value: 0.03  Name of Provider Notified: Dr. Melene Plan  Orders Received? Or Actions Taken?:

## 2018-03-25 NOTE — ED Notes (Signed)
Pt made aware that urine collection is needed. States she will try after receiving fluids

## 2018-03-25 NOTE — ED Notes (Signed)
ED Provider at bedside. 

## 2018-03-26 ENCOUNTER — Inpatient Hospital Stay (HOSPITAL_COMMUNITY): Payer: Medicaid Other

## 2018-03-26 ENCOUNTER — Encounter (HOSPITAL_COMMUNITY): Payer: Self-pay

## 2018-03-26 DIAGNOSIS — M7989 Other specified soft tissue disorders: Secondary | ICD-10-CM

## 2018-03-26 DIAGNOSIS — F1490 Cocaine use, unspecified, uncomplicated: Secondary | ICD-10-CM

## 2018-03-26 DIAGNOSIS — E8770 Fluid overload, unspecified: Secondary | ICD-10-CM

## 2018-03-26 DIAGNOSIS — R0600 Dyspnea, unspecified: Secondary | ICD-10-CM

## 2018-03-26 DIAGNOSIS — I509 Heart failure, unspecified: Secondary | ICD-10-CM

## 2018-03-26 DIAGNOSIS — I10 Essential (primary) hypertension: Secondary | ICD-10-CM

## 2018-03-26 LAB — BASIC METABOLIC PANEL
ANION GAP: 10 (ref 5–15)
BUN: 23 mg/dL — ABNORMAL HIGH (ref 6–20)
CO2: 27 mmol/L (ref 22–32)
Calcium: 8.7 mg/dL — ABNORMAL LOW (ref 8.9–10.3)
Chloride: 105 mmol/L (ref 98–111)
Creatinine, Ser: 0.93 mg/dL (ref 0.44–1.00)
GFR calc non Af Amer: >60 mL/min (ref >60)
Glucose, Bld: 191 mg/dL — ABNORMAL HIGH (ref 70–99)
POTASSIUM: 4.3 mmol/L (ref 3.5–5.1)
SODIUM: 142 mmol/L (ref 135–145)

## 2018-03-26 LAB — TROPONIN I: Troponin I: <0.03 ng/mL (ref <0.03)

## 2018-03-26 LAB — ECHOCARDIOGRAM COMPLETE
HEIGHTINCHES: 62 in
WEIGHTICAEL: 1756.63 [oz_av]

## 2018-03-26 MED ORDER — LISINOPRIL 10 MG PO TABS
10.0000 mg | ORAL_TABLET | Freq: Every day | ORAL | Status: DC
  Administered 2018-03-27: 10 mg via ORAL
  Filled 2018-03-26: qty 1

## 2018-03-26 MED ORDER — ALBUTEROL SULFATE (2.5 MG/3ML) 0.083% IN NEBU
2.5000 mg | INHALATION_SOLUTION | RESPIRATORY_TRACT | Status: DC | PRN
  Filled 2018-03-26 (×2): qty 3

## 2018-03-26 MED ORDER — PREDNISONE 20 MG PO TABS
40.0000 mg | ORAL_TABLET | Freq: Every day | ORAL | Status: DC
  Administered 2018-03-26 – 2018-03-27 (×2): 40 mg via ORAL
  Filled 2018-03-26 (×2): qty 2

## 2018-03-26 MED ORDER — FUROSEMIDE 40 MG PO TABS
40.0000 mg | ORAL_TABLET | Freq: Every day | ORAL | Status: DC
  Administered 2018-03-27 – 2018-03-28 (×2): 40 mg via ORAL
  Filled 2018-03-26 (×2): qty 1

## 2018-03-26 MED ORDER — ARFORMOTEROL TARTRATE 15 MCG/2ML IN NEBU
15.0000 ug | INHALATION_SOLUTION | Freq: Two times a day (BID) | RESPIRATORY_TRACT | Status: DC
  Administered 2018-03-26 – 2018-03-29 (×6): 15 ug via RESPIRATORY_TRACT
  Filled 2018-03-26 (×6): qty 2

## 2018-03-26 MED ORDER — IOPAMIDOL (ISOVUE-370) INJECTION 76%
INTRAVENOUS | Status: AC
  Filled 2018-03-26: qty 100

## 2018-03-26 MED ORDER — IOPAMIDOL (ISOVUE-370) INJECTION 76%
80.0000 mL | Freq: Once | INTRAVENOUS | Status: AC | PRN
  Administered 2018-03-26: 100 mL via INTRAVENOUS

## 2018-03-26 MED ORDER — BUDESONIDE 0.5 MG/2ML IN SUSP
0.5000 mg | Freq: Two times a day (BID) | RESPIRATORY_TRACT | Status: DC
  Administered 2018-03-26 – 2018-03-29 (×6): 0.5 mg via RESPIRATORY_TRACT
  Filled 2018-03-26 (×6): qty 2

## 2018-03-26 MED ORDER — AZITHROMYCIN 250 MG PO TABS
500.0000 mg | ORAL_TABLET | Freq: Every day | ORAL | Status: AC
  Administered 2018-03-26 – 2018-03-27 (×3): 500 mg via ORAL
  Filled 2018-03-26 (×3): qty 2

## 2018-03-26 MED ORDER — HYDRALAZINE HCL 20 MG/ML IJ SOLN
5.0000 mg | INTRAMUSCULAR | Status: DC | PRN
  Administered 2018-03-26 – 2018-03-28 (×3): 5 mg via INTRAVENOUS
  Filled 2018-03-26 (×4): qty 1

## 2018-03-26 NOTE — H&P (Signed)
History and Physical    Vanessa Wallace ZOX:[id number] DOB: 1955-08-23 DOA: 03/25/2018  PCP: System, Provider Not In   Patient coming from: Home   Chief Complaint: SOB, leg swelling  HPI: Vanessa Wallace is a 62 y.o. female with medical history significant for COPD and asthma, Cocaine abuse, depression, presented to the ED with complaints of shortness of breath and leg swelling up to her thighs of 3 days duration.  Shortness of breath present at rest and with exertion.  Reports stable unchanged orthopnea.  Denies cough.  Reports progressive bilateral lower extremity swelling also, without pain or redness. No chest pain, no recent trips, no smell or family history of blood clots in lungs or legs.  No fever no chills. Ongoing tobacco abuse 3 cigarettes daily, denies alcohol abuse.  Patient reports 30 pound unintentional weight loss over the past 25-month.   ED Course: Mild tachycardia to 102, tachypnea to 24, O2 sats 75% on room air on ED arrival, improved to greater than 97% on 2 L.  BNP- 1063.  Mild elevated liver enzymes.  Unremarkable CBC BMP. two-view chest x-ray-chronic interstitial disease without acute infiltrate, COPD changes, increased prominence of pleural-based opacities at the laterally proceeds, bilaterally.   Patient given IV Solu-Medrol, DuoNeb IV, Lasix 401 hospitalist called to admit for new CHF diagnosis.   Review of Systems: As per HPI all other systems reviewed and negative  Past Medical History:  Diagnosis Date  . Anxiety   . Arthritis    "all over" (09/17/2013)  . Asthma   . CAP (community acquired pneumonia) 09/17/2013  . Chronic bronchitis (HCC)    "flares up w/my asthma" (09/17/2013)  . COPD (chronic obstructive pulmonary disease) (HCC)   . Hypertension   . Migraines    "weekly lately" (09/17/2013)    Past Surgical History:  Procedure Laterality Date  . ABDOMINAL HYSTERECTOMY     "partial"  . BUNIONECTOMY Bilateral    "right one came back after OR" (09/17/2013)    . LACERATION REPAIR Left 06/1999   Repair FPL, radial digital nerve, intrinsics, left thumb./notes 06/29/1999 (09/17/2013)  . TUBAL LIGATION       reports that she quit smoking about 3 years ago. Her smoking use included cigarettes. She has a 12.00 pack-year smoking history. She has never used smokeless tobacco. She reports that she has current or past drug history. Drugs: Marijuana and Cocaine. She reports that she does not drink alcohol.  No Known Allergies  Family History  Problem Relation Age of Onset  . Brain cancer Mother     Prior to Admission medications   Medication Sig Start Date End Date Taking? Authorizing Provider  albuterol (PROVENTIL HFA;VENTOLIN HFA) 108 (90 Base) MCG/ACT inhaler Inhale 1-2 puffs into the lungs every 6 (six) hours as needed for wheezing or shortness of breath. 08/15/17  Yes Penny Pia, MD  albuterol (PROVENTIL) (2.5 MG/3ML) 0.083% nebulizer solution Take 2.5 mg by nebulization every 6 (six) hours as needed for wheezing or shortness of breath.    Yes [provider]  aspirin 81 MG tablet Take 81 mg by mouth daily.   Yes [provider]  budesonide-formoterol (SYMBICORT) 160-4.5 MCG/ACT inhaler Inhale 2 puffs into the lungs 2 (two) times daily. 09/01/16  Yes [name], MD  clonazePAM (KLONOPIN) 0.5 MG tablet Take 1 tablet by mouth 2 (two) times daily as needed for anxiety. 01/31/18  Yes [provider]  DULoxetine (CYMBALTA) 60 MG capsule Take 60 mg by mouth daily. 07/22/17  Yes [provider]  ferrous sulfate 325 (65 FE) MG tablet Take 1 tablet (325 mg total) by mouth daily with breakfast. 09/01/16  Yes [name], MD  lisinopril (PRINIVIL,ZESTRIL) 5 MG tablet Take 5 mg by mouth daily. 07/13/17  Yes [provider]  montelukast (SINGULAIR) 10 MG tablet Take 10 mg by mouth at bedtime.   Yes [provider]  omeprazole (PRILOSEC) 20 MG capsule Take 20 mg by mouth daily.   Yes [provider]  amoxicillin-clavulanate (AUGMENTIN) 875-125 MG tablet Take 1 tablet by mouth every 12 (twelve) hours. Patient not taking: Reported on 03/25/2018 08/15/17   Penny Pia, MD  buPROPion (WELLBUTRIN XL) 150 MG 24 hr tablet Take 1 tablet (150 mg total) by mouth daily. Patient not taking: Reported on 03/25/2018 08/16/17   Penny Pia, MD  predniSONE (DELTASONE) 10 MG tablet Take 1 tablet (10 mg total) by mouth as directed. Take 40 mg for the next 2 days, then 30 mg po for the next 2 days, then take 20 mg po daily for the next 2 days, then take 10 mg po for the next 2 days. Patient not taking: Reported on 03/25/2018 08/15/17   Penny Pia, MD    Physical Exam: Vitals:   03/25/18 2100 03/25/18 2103 03/25/18 2307 03/25/18 2350  BP: (!) 151/98 (!) 151/98 (!) 154/85   Pulse: (!) 102 95 100   Resp: (!) 24 16 20    Temp:   98.6 F (37 C)   TempSrc:   Oral   SpO2: 94% 98% 97% 96%  Weight:    50.1 kg  Height:    5\' 2"  (1.575 m)    Constitutional: Mildly short of breath Vitals:   03/25/18 2100 03/25/18 2103 03/25/18 2307 03/25/18 2350  BP: (!) 151/98 (!) 151/98 (!) 154/85   Pulse: (!) 102 95 100   Resp: (!) 24 16 20    Temp:   98.6 F (37 C)   TempSrc:   Oral   SpO2: 94% 98% 97% 96%  Weight:    50.1 kg  Height:    5\' 2"  (1.575 m)   Eyes: PERRL, injected conjunctiva bilaterally ENMT: Mucous membranes are moist. Posterior pharynx clear of any exudate or lesions.  Neck: normal, supple, no masses, no thyromegaly Respiratory: Increased work of breathing, marked reduced air entry bilaterally with expiratory wheezing  Cardiovascular: Regular rate and rhythm, no murmurs / rubs / gallops.  Significant 3+ pitting edema to knees Abdomen: no tenderness, no masses palpated. No hepatosplenomegaly. Bowel sounds positive.  Musculoskeletal: no clubbing / cyanosis. No joint deformity upper and lower extremities. Good ROM, no contractures. Normal muscle tone.  Skin: no rashes, lesions, ulcers.  No induration Neurologic: CN 2-12 grossly intact.Strength 5/5 in all 4.  Psychiatric: Normal judgment and insight. Alert and oriented x 3. Normal mood.   Labs on Admission: I have personally reviewed following labs and imaging studies  CBC: Recent Labs  Lab 03/25/18 1926 03/25/18 1957  WBC 5.8  --   NEUTROABS 3.2  --   HGB 14.3 15.6*  HCT 44.4 46.0  MCV 93.1  --   PLT 306  --    Basic Metabolic Panel: Recent Labs  Lab 03/25/18 1926 03/25/18 1957  NA 142 142  K 3.8 3.6  CL 104 103  CO2 30  --   GLUCOSE 90 87  BUN 25* 25*  CREATININE 1.14* 1.20*  CALCIUM 8.9  --    Liver Function Tests: Recent Labs  Lab 03/25/18  1926  AST 53*  ALT 66*  ALKPHOS 67  BILITOT 0.5  PROT 6.9  ALBUMIN 3.4*   Cardiac Enzymes: Recent Labs  Lab 03/25/18 1926  TROPONINI 0.03*    Radiological Exams on Admission: Dg Chest 2 View  Result Date: 03/25/2018 CLINICAL DATA:  Shortness of breath, history asthma EXAM: CHEST - 2 VIEW COMPARISON:  08/11/2017 FINDINGS: Enlargement of cardiac silhouette. Atherosclerotic calcification aorta. Mediastinal contours and pulmonary vascularity normal. Emphysematous, chronic bronchitic and chronic interstitial changes, stable. No acute infiltrate, pleural effusion or pneumothorax. Pleural based opacities at the lateral apices appear more pronounced bilaterally. Diffuse osseous demineralization. IMPRESSION: Enlargement of cardiac silhouette. Changes of COPD and chronic interstitial disease without acute infiltrate. Increased prominence of pleural based opacities at the laterally proceeds bilaterally; further evaluation by CT chest recommended to exclude biapical masses. Electronically Signed   By: Ulyses Southward M.D.   On: 03/25/2018 20:32    EKG: Independently reviewed. Artifacts.  With baseline wander.  Sinus rhythm.  No significant change compared to prior.  Assessment/Plan Principal Problem:   Fluid overload Active Problems:   Cocaine use   Asthma  exacerbation   COPD exacerbation (HCC)   Depression  Fluid overload-lateral lower extremity swelling.  BNP markedly elevated 1063.  Chest x-ray-  EKG artifacts with baseline wander but no significant change from prior.  Two-view chest x-ray-pleural-based opacities bilaterally, CT chest recommended. -IV Lasix 40 twice daily -Tropes x3 -Repeat EKG -Echocardiogram -Input output, Daily weight -CMP a.m. -Lateral lower extremity Dopplers  Unintentional weight loss- 30lb in 3 months.  Chest x-ray increased prominence of pleural-based opacities, CT chest recommended to exclude biapical masses.  CT chest 07/2017-negative for similar findings. -Will get CT chest with contrast  Asthma/COPD exacerbation-expiratory wheeze, significant reduced air entry bilaterally.  Ongoing tobacco abuse.  IV Solu-Medrol 125 given in ED. -DuoNeb scheduled, albuterol as needed -Azithromycin -Prednisone 40 daily  Mild transaminitis-likely from hepatic congestion. -Acute hepatitis panel - CMP a.m  Cocaine abuse  HTN- systolic 150s to 726O -Continue home medications lisinopril 5 daily, may need to add/adjust meds  Depression -Continue home medications Cymbalta  DVT prophylaxis: Lovenox Code Status: Full Family Communication: Significant other and brother at bedside Disposition Plan: Per rounding team Consults called: None Admission status: inpt, tele   Onnie Boer MD [facility] Pager [phone] From 6PM-2AM.  Otherwise please contact night-coverage [email or web address] Password [email or web address]  03/26/2018, 12:02 AM

## 2018-03-26 NOTE — Progress Notes (Signed)
Pt diaphoretic, tachypnea at rest. BP elevated and MD paged. Orders for stat EKG and troponins ordered. Albuterol prn nebulizer given. PT had minimal changes with treatment

## 2018-03-26 NOTE — Progress Notes (Signed)
Initial Nutrition Assessment  DOCUMENTATION CODES:   Non-severe (moderate) malnutrition in context of social or environmental circumstances  INTERVENTION:    Magic cup TID with meals, each supplement provides 290 kcal and 9 grams of protein  Provide CHF diet education  NUTRITION DIAGNOSIS:   Moderate Malnutrition related to social / environmental circumstances(cocaine abuse) as evidenced by moderate fat depletion, moderate muscle depletion, severe muscle depletion.  GOAL:   Patient will meet greater than or equal to 90% of their needs  MONITOR:   PO intake, Supplement acceptance, Weight trends, Labs  REASON FOR ASSESSMENT:   Malnutrition Screening Tool    ASSESSMENT:   Patient with PMH significant for anxiety, CAP, COPD, cocaine abuse, and migraines. Presents this admission with complaints of progressive shortness of breath and acute onset of lower extremity swelling that began approximately 3 days PTA. Admitted for fluid overload and found to have Acute CHF exacerbation.    Pt endorses a decreased appetite over the last two weeks due to unknown reasons. States she typically eats two meals daily that consist of rice, chicken, sausage, and eggs. Pt unable to provide further recall due to feeling tired.  She consumed cereal this morning without complication. She does not wish to have Ensure but is agreeable to ice cream. Will send Magic Cup TID to maximize calories and protein.   Pt will need new CHF diet education. Will attempt once pt is more alert.   Pt unable to provide UBW. Records are limited in weight history but show pt weighed 94 lb at [facility] on 7/23 and 110 lb this admission. Suspect pt's weight gain is likely fluid related. Nutrition-Focused physical exam completed. Bilateral lower extremity fluid accumulation may be masking muscle losses.   Medications reviewed and include: 40 mg lasix BID, prednisone Labs reviewed.   NUTRITION - FOCUSED PHYSICAL EXAM:   Most Recent Value  Orbital Region  Mild depletion  Upper Arm Region  Moderate depletion  Thoracic and Lumbar Region  Unable to assess  Buccal Region  Moderate depletion  Temple Region  Moderate depletion  Clavicle Bone Region  Severe depletion  Clavicle and Acromion Bone Region  Severe depletion  Scapular Bone Region  Unable to assess  Dorsal Hand  Moderate depletion  Patellar Region  Mild depletion  Anterior Thigh Region  Mild depletion  Posterior Calf Region  Mild depletion  Edema (RD Assessment)  Moderate     Diet Order:   Diet Order            Diet Heart Room service appropriate? Yes; Fluid consistency: Thin  Diet effective now              EDUCATION NEEDS:   Not appropriate for education at this time  Skin:  Skin Assessment: Reviewed RN Assessment  Last BM:  03/25/18  Height:   Ht Readings from Last 1 Encounters:  03/25/18 5\' 2"  (1.575 m)    Weight:   Wt Readings from Last 1 Encounters:  03/26/18 49.8 kg    Ideal Body Weight:  50 kg  BMI:  Body mass index is 20.08 kg/m.  Estimated Nutritional Needs:   Kcal:  1500-1700 kcal  Protein:  75-90 grams  Fluid:  >/= 1.5 L/day  Vanessa Kick RD, LDN Clinical Nutrition Pager # - [phone]

## 2018-03-26 NOTE — Progress Notes (Signed)
  Echocardiogram 2D Echocardiogram has been performed.  Vanessa Wallace 03/26/2018, 3:55 PM

## 2018-03-26 NOTE — Progress Notes (Addendum)
PROGRESS NOTE [facility]   Vanessa Wallace   ZOX:[id number] DOB: 09-17-1955  DOA: 03/25/2018 PCP: System, Provider Not In   Brief Narrative:  Vanessa Wallace is a 62 year old female with medical history significant for COPD, asthma, cocaine abuse, depression who presented to the emergency department complaining of shortness of breath and leg swelling for 3 days prior to admission.  Patient with ongoing tobacco abuse and intentional weight loss about 30 pounds in the past 3 months.  Upon ED evaluation she was found to be tachycardic, hypoxic with O2 sat down to 75% on room air, BNP of 1063, mild elevated liver enzymes.  Chest x-ray with mild increasing vascular congestion with apical densities.  CT of the chest shows symmetric soup pleural soft tissue densities in the apices suspicious for malignancy.  PET-CT recommended for further evaluation.  Patient was treated with Lasix, Solu-Medrol and antibiotics and she was admitted with working diagnosis of acute respiratory failure with hypoxia.  Subjective: RN called me to bedside due to pt having shortness of breath and chest discomfort with diaphoresis.  EKG done stat with T wave inversion in the lateral leads, LVH and biatrial enlargement.  Troponin negative.  Patient seen and examined, she reports some improvement from yesterday however remains with conversational dyspnea and short of breath with minimal exertion.  She has diuresed well.  Her leg swelling has improved.   Assessment & Plan: Acute respiratory failure with hypoxia Felt to be multifactorial from COPD and possible new CHF.  Clinical findings consistent with some degree of CHF, chest x-ray with mild vascular congestion and cardiomegaly, lower extremity edema, mild JVD and BNP markedly elevated at 1063 in combination of hypoxia and shortness of breath.  Patient symptoms improving with diuresis, however she is also been getting treatment for COPD exacerbation.  Echocardiogram  pending.  Troponin negative.  EKG reviewed by me with LVH, biatrial enlargement, and T wave inversions in leads II, III and aVF.  Differential diagnosis include possible lung malignancy given unintentional weight loss and CT chest findings.  Patient is long term smoker.  Underlying causes  COPD exacerbation with ongoing tobacco abuse Continue prednisone, will add Brovana and Pulmicort, continue albuterol as needed.  Patient with azithromycin will complete 3 days of therapy.  Check viral PCR and procalcitonin.  Wean oxygen as able.  CT consistent with emphysematous changes.  Fluid overload - suspect CHF Clinical findings consistent with some degree of CHF, chest x-ray with mild vascular congestion and cardiomegaly, lower extremity edema, mild JVD and BNP markedly elevated at 1063 in combination of hypoxia and shortness of breath.  Patient treated with IV Lasix, cardiology consulted due to T wave inversions in the lateral leads, they felt that patient is euvolemic and has switch patient to oral Lasix for now.  Echocardiogram is pending.  Lisinopril has been increased to 10 mg.  Doppler ultrasound negative for DVT  Unintentional weight loss Incidental finding on CT chest of apical densities of the lungs.  Concern for malignancy - likely contributing to shortness of breath.  PET/CT has been recommended however unable to perform this while inpatient, will defer to outpatient work-up.  Hypertension BP slightly elevated, lisinopril has been adjusted to 10 mg daily  Cocaine and tobacco abuse Cessation discussed  Depression Continue current regimen  DVT prophylaxis: Lovenox Code Status: Full code Family Communication: None at bedside Disposition Plan: Home in 1 to 2 days if respiratory status improved  Consultants:   Cardiology  Procedures:   Echocardiogram  Antimicrobials: Anti-infectives (From admission, onward)   Start     Dose/Rate Route Frequency Ordered Stop   03/26/18 0045   azithromycin (ZITHROMAX) tablet 500 mg     500 mg Oral Daily 03/26/18 0041 03/28/18 0959         Objective: Vitals:   03/26/18 0824 03/26/18 0900 03/26/18 1033 03/26/18 1300  BP: (!) 175/110  (!) 169/92 (!) 158/75  Pulse:    87  Resp:    18  Temp:    98.5 F (36.9 C)  TempSrc:    Oral  SpO2:  99%  99%  Weight:      Height:        Intake/Output Summary (Last 24 hours) at 03/26/2018 1639 Last data filed at 03/26/2018 1300 Gross per 24 hour  Intake 560 ml  Output 3800 ml  Net -3240 ml   Filed Weights   03/25/18 1911 03/25/18 2350 03/26/18 0447  Weight: 44.9 kg 50.1 kg 49.8 kg    Examination:  General exam: Appears calm and comfortable  HEENT: OP moist and clear, poor dentition Respiratory system: Decreased breath sounds bilaterally, bibasilar crackles.  Mild expiratory wheezing Cardiovascular system: S1 & S2 heard, RRR. + mild JVD. No murmurs  Gastrointestinal system: Abdomen is nondistended, soft and nontender. Central nervous system: Alert and oriented. No focal neurological deficits. Extremities: LE edema b/l 1-2+ pitting. Symmetric, strength 5/5   Skin: No rashes, lesions or ulcers Psychiatry: Judgement and insight appear normal. Mood & affect appropriate.   Data Reviewed: I have personally reviewed following labs and imaging studies  CBC: Recent Labs  Lab 03/25/18 1926 03/25/18 1957  WBC 5.8  --   NEUTROABS 3.2  --   HGB 14.3 15.6*  HCT 44.4 46.0  MCV 93.1  --   PLT 306  --    Basic Metabolic Panel: Recent Labs  Lab 03/25/18 1926 03/25/18 1957 03/26/18 0433  NA 142 142 142  K 3.8 3.6 4.3  CL 104 103 105  CO2 30  --  27  GLUCOSE 90 87 191*  BUN 25* 25* 23*  CREATININE 1.14* 1.20* 0.93  CALCIUM 8.9  --  8.7*   GFR: Estimated Creatinine Clearance: 50.6 mL/min (by C-G formula based on SCr of 0.93 mg/dL). Liver Function Tests: Recent Labs  Lab 03/25/18 1926  AST 53*  ALT 66*  ALKPHOS 67  BILITOT 0.5  PROT 6.9  ALBUMIN 3.4*   No results  for input(s): LIPASE, AMYLASE in the last 168 hours. No results for input(s): AMMONIA in the last 168 hours. Coagulation Profile: No results for input(s): INR, PROTIME in the last 168 hours. Cardiac Enzymes: Recent Labs  Lab 03/25/18 1926 03/26/18 0907  TROPONINI 0.03* <0.03   BNP (last 3 results) No results for input(s): PROBNP in the last 8760 hours. HbA1C: No results for input(s): HGBA1C in the last 72 hours. CBG: No results for input(s): GLUCAP in the last 168 hours. Lipid Profile: No results for input(s): CHOL, HDL, LDLCALC, TRIG, CHOLHDL, LDLDIRECT in the last 72 hours. Thyroid Function Tests: No results for input(s): TSH, T4TOTAL, FREET4, T3FREE, THYROIDAB in the last 72 hours. Anemia Panel: No results for input(s): VITAMINB12, FOLATE, FERRITIN, TIBC, IRON, RETICCTPCT in the last 72 hours. Sepsis Labs: No results for input(s): PROCALCITON, LATICACIDVEN in the last 168 hours.  No results found for this or any previous visit (from the past 240 hour(s)).    Radiology Studies: Dg Chest 2 View  Result Date: 03/25/2018 CLINICAL DATA:  Shortness of  breath, history asthma EXAM: CHEST - 2 VIEW COMPARISON:  08/11/2017 FINDINGS: Enlargement of cardiac silhouette. Atherosclerotic calcification aorta. Mediastinal contours and pulmonary vascularity normal. Emphysematous, chronic bronchitic and chronic interstitial changes, stable. No acute infiltrate, pleural effusion or pneumothorax. Pleural based opacities at the lateral apices appear more pronounced bilaterally. Diffuse osseous demineralization. IMPRESSION: Enlargement of cardiac silhouette. Changes of COPD and chronic interstitial disease without acute infiltrate. Increased prominence of pleural based opacities at the laterally proceeds bilaterally; further evaluation by CT chest recommended to exclude biapical masses. Electronically Signed   By: Ulyses Southward M.D.   On: 03/25/2018 20:32   Ct Angio Chest Pe W Or Wo Contrast  Result  Date: 03/26/2018 CLINICAL DATA:  Dyspnea and bilateral lower extremity swelling. 30 pound unintentional weight loss. Shortness of breath. EXAM: CT ANGIOGRAPHY CHEST WITH CONTRAST TECHNIQUE: Multidetector CT imaging of the chest was performed using the standard protocol during bolus administration of intravenous contrast. Multiplanar CT image reconstructions and MIPs were obtained to evaluate the vascular anatomy. CONTRAST:  ISOVUE-370 IOPAMIDOL (ISOVUE-370) INJECTION 76% COMPARISON:  Radiograph yesterday.  Chest CT 08/12/2017 FINDINGS: Cardiovascular: There are no filling defects within the pulmonary arteries to suggest pulmonary embolus. Multi chamber cardiomegaly with right greater than left heart dilatation. Aortic atherosclerosis and tortuosity without dissection. There are coronary artery calcifications. No pericardial effusion. Mediastinum/Nodes: Enlarged right hilar node measuring 18 mm, previously 19 mm. Additional prominent right hilar nodes are also unchanged. 12 mm left hilar node previously 13 mm. Elongated AP window node is unchanged measuring 7 mm short axis. No thyroid nodule. The esophagus is nondistended. Lungs/Pleura: Advanced emphysema. Biapical subpleural soft tissue density appears symmetric but is more prominent than on prior CT. Chronic central bronchial thickening most prominent in the lower lobes. Subpleural consolidation in both dependent lower lobes has improved with mild residual linear opacity. No new focal airspace disease. Minimal frothy debris in the dependent trachea and left mainstem bronchus. Apical subpleural density represents intercostal muscles. No pleural fluid or pulmonary edema. Upper Abdomen: Paucity of fat suggesting cachexia. Atherosclerosis of included abdominal vasculature. Adrenal glands are not well visualized. Musculoskeletal: Moderate T8 superior endplate compression fracture is new from prior exam. Chronic compression fracture of T7 with additional scattered  Schmorl's nodes throughout the thoracic spine. General paucity of body fat suggesting cachexia. Review of the MIP images confirms the above findings. IMPRESSION: 1. No pulmonary embolus. 2. Symmetric subpleural soft tissue density at the apices, may be increased conspicuity of intercostal muscles secondary to loss of body fat, however malignancy is not excluded in the setting of weight loss and emphysema. PET-CT could be considered to evaluate for metabolic activity. 3. Persistent or recurrent dependent opacity in the left greater than right lower lobe, less prominent than on prior exam. There is slight frothy debris in the trachea and left mainstem bronchus raising possibility of aspiration. 4. Advanced emphysema. 5. Cardiomegaly with coronary artery calcifications. Aortic atherosclerosis. 6. T8 compression fracture is age indeterminate but new from January 2019 CT. Chronic T7 compression fracture. 7. Paucity of body fat suggest cachexia. 8. Prominent bilateral hilar lymph nodes, right greater than left, unchanged from prior exam, nonspecific. Aortic Atherosclerosis (ICD10-I70.0) and Emphysema (ICD10-J43.9). Electronically Signed   By: Narda Rutherford M.D.   On: 03/26/2018 02:12      Scheduled Meds: . albuterol  5 mg Nebulization Once  . aspirin EC  81 mg Oral Daily  . azithromycin  500 mg Oral Daily  . DULoxetine  60 mg Oral Daily  .  enoxaparin (LOVENOX) injection  30 mg Subcutaneous QHS  . furosemide  40 mg Intravenous BID  . [START ON 03/27/2018] furosemide  40 mg Oral Daily  . ipratropium-albuterol  3 mL Nebulization TID  . [START ON 03/27/2018] lisinopril  10 mg Oral Daily  . mometasone-formoterol  2 puff Inhalation BID  . montelukast  10 mg Oral QHS  . pantoprazole  40 mg Oral Daily  . predniSONE  40 mg Oral Q breakfast   Continuous Infusions:   LOS: 1 day    Time spent: Critical care time 35 minutes  Latrelle Dodrill, MD Pager: Text Page via [email or web address]   If 7PM-7AM, please contact  night-coverage [email or web address] 03/26/2018, 4:39 PM   Note - This record has been created using [employer]. Chart creation errors have been sought, but may not always have been located. Such creation errors do not reflect on the standard of medical care.

## 2018-03-26 NOTE — Consult Note (Addendum)
Cardiology Consultation:   Patient ID: Vanessa Wallace; [id number]; May 07, 1956   Admit date: 03/25/2018 Date of Consult: 03/26/2018  Primary Care Provider: System, Provider Not In Primary Cardiologist: New   Patient Profile:   Vanessa Wallace is a 62 y.o. female with a hx of COPD/emphysema, cocaine abuse and depression who is being seen today for the evaluation of CHF at the request of Dr. Mariea Clonts.  History of Present Illness:   Vanessa Wallace is a 62 year old female with a history stated above who presented to [facility] on 03/25/2018 with complaints of progressive shortness of breath and acute onset of lower extremity swelling that began approximately 3 days prior to admission. Patient reports that her shortness of breath has been ongoing for the last several months secondary to what she thought was chronic back pain however reported to the emergency department after her lower legs began to acutely swell. She denies chest pain, palpitations, orthopnea, dizziness, recent illness including fevers, chills, nausea or vomiting, presyncope or syncopal episodes. She reports dyspnea and LE swelling. She is a current everyday tobacco smoker however, denies alcohol use.  Her UDS on admission was positive for cocaine.  She reports a 30 pound unintentional weight loss over the last 3 months.  She is been followed by her PCP for this with unclear etiology.  In the ED, she was found to be tachycardic, tachypneic with an O2 saturation of 75% on room air. This improved with 2 L [place] supplemental O2.  EKG revealed NSR with evidence of right atrial enlargement as well as significant LVH.  CXR revealed enlarged cardiac silhouette, COPD without acute infiltrate, increased prominence of pleural-based opacities with chest CT recommended to exclude biapical masses.  Chest CT performed with no PE however with evidence of symmetric subpleural soft tissue density at the apices, suspicious for malignancy in the setting of weight loss  and emphysema with recommendations for PET-CT. BMP noted to be elevated at 1063.  She was given IV Solu-Medrol, DuoNeb, Lasix 401  Cardiology has been asked to follow given concern for acute on chronic CHF exacerbation.   Past Medical History:  Diagnosis Date  . Anxiety   . Arthritis    "all over" (09/17/2013)  . Asthma   . CAP (community acquired pneumonia) 09/17/2013  . Chronic bronchitis (HCC)    "flares up w/my asthma" (09/17/2013)  . COPD (chronic obstructive pulmonary disease) (HCC)   . Hypertension   . Migraines    "weekly lately" (09/17/2013)    Past Surgical History:  Procedure Laterality Date  . ABDOMINAL HYSTERECTOMY     "partial"  . BUNIONECTOMY Bilateral    "right one came back after OR" (09/17/2013)  . LACERATION REPAIR Left 06/1999   Repair FPL, radial digital nerve, intrinsics, left thumb./notes 06/29/1999 (09/17/2013)  . TUBAL LIGATION       Prior to Admission medications   Medication Sig Start Date End Date Taking? Authorizing Provider  albuterol (PROVENTIL HFA;VENTOLIN HFA) 108 (90 Base) MCG/ACT inhaler Inhale 1-2 puffs into the lungs every 6 (six) hours as needed for wheezing or shortness of breath. 08/15/17  Yes Penny Pia, MD  albuterol (PROVENTIL) (2.5 MG/3ML) 0.083% nebulizer solution Take 2.5 mg by nebulization every 6 (six) hours as needed for wheezing or shortness of breath.    Yes [provider]  aspirin 81 MG tablet Take 81 mg by mouth daily.   Yes [provider]  budesonide-formoterol (SYMBICORT) 160-4.5 MCG/ACT inhaler Inhale 2 puffs into the lungs 2 (two)  times daily. 09/01/16  Yes [name], MD  clonazePAM (KLONOPIN) 0.5 MG tablet Take 1 tablet by mouth 2 (two) times daily as needed for anxiety. 01/31/18  Yes [provider]  DULoxetine (CYMBALTA) 60 MG capsule Take 60 mg by mouth daily. 07/22/17  Yes [provider]  ferrous sulfate 325 (65 FE) MG tablet Take 1 tablet (325 mg total) by mouth daily with  breakfast. 09/01/16  Yes [name], MD  lisinopril (PRINIVIL,ZESTRIL) 5 MG tablet Take 5 mg by mouth daily. 07/13/17  Yes [provider]  montelukast (SINGULAIR) 10 MG tablet Take 10 mg by mouth at bedtime.   Yes [provider]  omeprazole (PRILOSEC) 20 MG capsule Take 20 mg by mouth daily.   Yes [provider]  amoxicillin-clavulanate (AUGMENTIN) 875-125 MG tablet Take 1 tablet by mouth every 12 (twelve) hours. Patient not taking: Reported on 03/25/2018 08/15/17   Penny Pia, MD  buPROPion (WELLBUTRIN XL) 150 MG 24 hr tablet Take 1 tablet (150 mg total) by mouth daily. Patient not taking: Reported on 03/25/2018 08/16/17   Penny Pia, MD  predniSONE (DELTASONE) 10 MG tablet Take 1 tablet (10 mg total) by mouth as directed. Take 40 mg for the next 2 days, then 30 mg po for the next 2 days, then take 20 mg po daily for the next 2 days, then take 10 mg po for the next 2 days. Patient not taking: Reported on 03/25/2018 08/15/17   Penny Pia, MD    Inpatient Medications: Scheduled Meds: . albuterol  5 mg Nebulization Once  . aspirin EC  81 mg Oral Daily  . azithromycin  500 mg Oral Daily  . DULoxetine  60 mg Oral Daily  . enoxaparin (LOVENOX) injection  30 mg Subcutaneous QHS  . furosemide  40 mg Intravenous BID  . iopamidol      . ipratropium-albuterol  3 mL Nebulization TID  . lisinopril  5 mg Oral Daily  . mometasone-formoterol  2 puff Inhalation BID  . montelukast  10 mg Oral QHS  . pantoprazole  40 mg Oral Daily  . predniSONE  40 mg Oral Q breakfast   Continuous Infusions:  PRN Meds: acetaminophen **OR** acetaminophen, albuterol, hydrALAZINE, polyethylene glycol  Allergies:   No Known Allergies  Social History:   Social History   Socioeconomic History  . Marital status: Single    Spouse name: Not on file  . Number of children: Not on file  . Years of education: Not on file  . Highest education level: Not on file  Occupational  History  . Not on file  Social Needs  . Financial resource strain: Not on file  . Food insecurity:    Worry: Not on file    Inability: Not on file  . Transportation needs:    Medical: Not on file    Non-medical: Not on file  Tobacco Use  . Smoking status: Former Smoker    Packs/day: 0.30    Years: 40.00    Pack years: 12.00    Types: Cigarettes    Last attempt to quit: 09/18/2014    Years since quitting: 3.5  . Smokeless tobacco: Never Used  . Tobacco comment: 09/17/2013 "weaned down from 2 ppd of cigarettes"  Substance and Sexual Activity  . Alcohol use: No    Alcohol/week: 6.0 standard drinks    Types: 6 Cans of beer per week    Comment: Quit 2 weeks  . Drug use: Yes    Types: Marijuana,  Cocaine    Comment: 09/17/2013 " Previous history of cocaine abuse; "quit over 1 yr ago"  . Sexual activity: Yes  Lifestyle  . Physical activity:    Days per week: Not on file    Minutes per session: Not on file  . Stress: Not on file  Relationships  . Social connections:    Talks on phone: Not on file    Gets together: Not on file    Attends religious service: Not on file    Active member of club or organization: Not on file    Attends meetings of clubs or organizations: Not on file    Relationship status: Not on file  . Intimate partner violence:    Fear of current or ex partner: Not on file    Emotionally abused: Not on file    Physically abused: Not on file    Forced sexual activity: Not on file  Other Topics Concern  . Not on file  Social History Narrative  . Not on file    Family History:   Family History  Problem Relation Age of Onset  . Brain cancer Mother    Family Status:  Family Status  Relation Name Status  . Mother  (Not Specified)    ROS:  Please see the history of present illness.  All other ROS reviewed and negative.     Physical Exam/Data:   Vitals:   03/26/18 0447 03/26/18 0812 03/26/18 0824 03/26/18 0900  BP: (!) 130/95 (!) 158/115 (!) 175/110     Pulse: 98 98    Resp: 18 20    Temp: 98.7 F (37.1 C) 98.2 F (36.8 C)    TempSrc: Oral Oral    SpO2: 98% 100%  99%  Weight: 49.8 kg     Height:        Intake/Output Summary (Last 24 hours) at 03/26/2018 0954 Last data filed at 03/26/2018 0834 Gross per 24 hour  Intake 240 ml  Output 1300 ml  Net -1060 ml   Filed Weights   03/25/18 1911 03/25/18 2350 03/26/18 0447  Weight: 44.9 kg 50.1 kg 49.8 kg   Body mass index is 20.08 kg/m.   General: Frail, older than stated age, NAD Skin: Warm, dry, intact  Head: Normocephalic, atraumatic, clear, moist mucus membranes. Neck: Negative for carotid bruits. No JVD Lungs:Clear to ausculation bilaterally. No wheezes, rales, or rhonchi. Breathing is unlabored. Cardiovascular: RRR with S1 S2. No murmurs, rubs, gallops, or LV heave appreciated. Abdomen: Soft, non-tender, non-distended with normoactive bowel sounds. No obvious abdominal masses. MSK: Strength and tone appear normal for age. 5/5 in all extremities Extremities: 2+ bilateral ankle edema. No clubbing or cyanosis. DP/PT pulses 2+ bilaterally Neuro: Alert and oriented. No focal deficits. No facial asymmetry. MAE spontaneously. Psych: Responds to questions appropriately with normal affect.     EKG:  The EKG was personally reviewed and demonstrates: 03/25/2018 NSR with evidence of atrial enlargement as well as severe LVH with diffuse T wave abnormalities Telemetry:  Telemetry was personally reviewed and demonstrates: 03/26/2018 sinus tachycardia 90s-100s  Relevant CV Studies:  ECHO: Pending  CATH: None  Laboratory Data:  Chemistry Recent Labs  Lab 03/25/18 1926 03/25/18 1957 03/26/18 0433  NA 142 142 142  K 3.8 3.6 4.3  CL 104 103 105  CO2 30  --  27  GLUCOSE 90 87 191*  BUN 25* 25* 23*  CREATININE 1.14* 1.20* 0.93  CALCIUM 8.9  --  8.7*  GFRNONAA 51*  --  >  60  GFRAA 59*  --  >60  ANIONGAP 8  --  10    Total Protein  Date Value Ref Range Status  03/25/2018 6.9  6.5 - 8.1 g/dL Final   Albumin  Date Value Ref Range Status  03/25/2018 3.4 (L) 3.5 - 5.0 g/dL Final   AST  Date Value Ref Range Status  03/25/2018 53 (H) 15 - 41 U/L Final   ALT  Date Value Ref Range Status  03/25/2018 66 (H) 0 - 44 U/L Final   Alkaline Phosphatase  Date Value Ref Range Status  03/25/2018 67 38 - 126 U/L Final   Total Bilirubin  Date Value Ref Range Status  03/25/2018 0.5 0.3 - 1.2 mg/dL Final   Hematology Recent Labs  Lab 03/25/18 1926 03/25/18 1957  WBC 5.8  --   RBC 4.77  --   HGB 14.3 15.6*  HCT 44.4 46.0  MCV 93.1  --   MCH 30.0  --   MCHC 32.2  --   RDW 13.9  --   PLT 306  --    Cardiac Enzymes Recent Labs  Lab 03/25/18 1926 03/26/18 0907  TROPONINI 0.03* <0.03   No results for input(s): TROPIPOC in the last 168 hours.  BNP Recent Labs  Lab 03/25/18 1927  BNP 1,063.9*    DDimer No results for input(s): DDIMER in the last 168 hours. TSH:  Lab Results  Component Value Date   TSH 1.255 08/30/2016   Lipids:No results found for: CHOL, HDL, LDLCALC, LDLDIRECT, TRIG, CHOLHDL HgbA1c: Lab Results  Component Value Date   HGBA1C 5.5 08/30/2016    Radiology/Studies:  Dg Chest 2 View  Result Date: 03/25/2018 CLINICAL DATA:  Shortness of breath, history asthma EXAM: CHEST - 2 VIEW COMPARISON:  08/11/2017 FINDINGS: Enlargement of cardiac silhouette. Atherosclerotic calcification aorta. Mediastinal contours and pulmonary vascularity normal. Emphysematous, chronic bronchitic and chronic interstitial changes, stable. No acute infiltrate, pleural effusion or pneumothorax. Pleural based opacities at the lateral apices appear more pronounced bilaterally. Diffuse osseous demineralization. IMPRESSION: Enlargement of cardiac silhouette. Changes of COPD and chronic interstitial disease without acute infiltrate. Increased prominence of pleural based opacities at the laterally proceeds bilaterally; further evaluation by CT chest recommended to exclude  biapical masses. Electronically Signed   By: Ulyses Southward M.D.   On: 03/25/2018 20:32   Ct Angio Chest Pe W Or Wo Contrast  Result Date: 03/26/2018 CLINICAL DATA:  Dyspnea and bilateral lower extremity swelling. 30 pound unintentional weight loss. Shortness of breath. EXAM: CT ANGIOGRAPHY CHEST WITH CONTRAST TECHNIQUE: Multidetector CT imaging of the chest was performed using the standard protocol during bolus administration of intravenous contrast. Multiplanar CT image reconstructions and MIPs were obtained to evaluate the vascular anatomy. CONTRAST:  ISOVUE-370 IOPAMIDOL (ISOVUE-370) INJECTION 76% COMPARISON:  Radiograph yesterday.  Chest CT 08/12/2017 FINDINGS: Cardiovascular: There are no filling defects within the pulmonary arteries to suggest pulmonary embolus. Multi chamber cardiomegaly with right greater than left heart dilatation. Aortic atherosclerosis and tortuosity without dissection. There are coronary artery calcifications. No pericardial effusion. Mediastinum/Nodes: Enlarged right hilar node measuring 18 mm, previously 19 mm. Additional prominent right hilar nodes are also unchanged. 12 mm left hilar node previously 13 mm. Elongated AP window node is unchanged measuring 7 mm short axis. No thyroid nodule. The esophagus is nondistended. Lungs/Pleura: Advanced emphysema. Biapical subpleural soft tissue density appears symmetric but is more prominent than on prior CT. Chronic central bronchial thickening most prominent in the lower lobes. Subpleural consolidation in both  dependent lower lobes has improved with mild residual linear opacity. No new focal airspace disease. Minimal frothy debris in the dependent trachea and left mainstem bronchus. Apical subpleural density represents intercostal muscles. No pleural fluid or pulmonary edema. Upper Abdomen: Paucity of fat suggesting cachexia. Atherosclerosis of included abdominal vasculature. Adrenal glands are not well visualized. Musculoskeletal:  Moderate T8 superior endplate compression fracture is new from prior exam. Chronic compression fracture of T7 with additional scattered Schmorl's nodes throughout the thoracic spine. General paucity of body fat suggesting cachexia. Review of the MIP images confirms the above findings. IMPRESSION: 1. No pulmonary embolus. 2. Symmetric subpleural soft tissue density at the apices, may be increased conspicuity of intercostal muscles secondary to loss of body fat, however malignancy is not excluded in the setting of weight loss and emphysema. PET-CT could be considered to evaluate for metabolic activity. 3. Persistent or recurrent dependent opacity in the left greater than right lower lobe, less prominent than on prior exam. There is slight frothy debris in the trachea and left mainstem bronchus raising possibility of aspiration. 4. Advanced emphysema. 5. Cardiomegaly with coronary artery calcifications. Aortic atherosclerosis. 6. T8 compression fracture is age indeterminate but new from January 2019 CT. Chronic T7 compression fracture. 7. Paucity of body fat suggest cachexia. 8. Prominent bilateral hilar lymph nodes, right greater than left, unchanged from prior exam, nonspecific. Aortic Atherosclerosis (ICD10-I70.0) and Emphysema (ICD10-J43.9). Electronically Signed   By: Narda Rutherford M.D.   On: 03/26/2018 02:12   Assessment and Plan:   1.  Acute CHF exacerbation: -Patient presented with 3-day history of lower extremity swelling and shortness of breath at rest and with exertion -BNP on arrival was elevated at 1063 -CXR with chronic interstitial disease without acute infiltrate however with increased prominence of pleural-based opacities  -She was given IV Lasix 40 mg x 1 for fluid volume overload and started on IV Lasix 40 mg twice daily -Echocardiogram with pending results -Troponin negative x2>> continue to monitor and trend -Denies chest pain -EKG with evidence of right atrial and left ventricular  enlargement, NSR with diffuse T wave abnormalities -Weight, 99lb on admission>>> patient reports a 30 pound weight loss over the last 3 months with a CTA with subpleural soft tissue density at the apices, suspicious for malignancy in the setting of weight loss and emphysema with recommendations for PET-CT -I&O, net -1 L -Continue Lasix 40 mg twice daily -Scheduled for lower extremity Dopplers per primary team -Will base further treatment on echocardiogram findings however there is concern for LV dysfunction given her presenting symptoms with concern for lung CA given chest CTA results from 03/26/2018 which could additionally be contributing to ongoing SOB.  I have discussed these results with the patient and she is aware  2.  Unintentional weight loss: -Patient reports approximately 30 pound weight loss in 3 months.  Chest x-ray on admission with increased prominence of pleural-based opacities  -Chest CTA 03/26/2018 with subpleural soft tissue density at the apices, suspicious for malignancy in the setting of weight loss and emphysema with recommendations for PET-CT -See above  3.  Asthma/COPD exacerbation: -Patient presented with expiratory wheezing, shortness of breath and lower extremity swelling for approximately 3 days -Ongoing tobacco use -Patient was given IV Solu-Medrol, DuoNeb and started on IV antibiotics -Per primary team  4.  HTN: -Elevated, 175/110, 158/115, 130/95 -We will increase home lisinopril to 10 mg daily>> consider adding amlodipine as well -Consider adding beta-blocker given tachycardia and elevated BP based on response and echocardiogram  results  5.  Depression: -Continue Cymbalta  6.  Tobacco and illicit drug use: -Patient with ongoing tobacco and cocaine use -UDS positive for cocaine this admission -Encouraged cessation and the dangers of both  For questions or updates, please contact [facility] Please consult [email or web address] for contact info under  Cardiology/STEMI.   Raliegh Ip NP-[facility] Pager: [phone] 03/26/2018 9:54 AM   The patient was seen, examined and discussed with Georgie Chard, NP  and I agree with the above.   62 y.o. female with a hx of COPD/emphysema, cocaine abuse and depression who is being seen today for the evaluation of CHF. Admitted on 03/25/2018 with complaints of progressive shortness of breath and acute onset of LE edema x 3 days. No chest pain, palpitations, no fevers. UDS on admission was positive for cocaine.  Also 30 pound unintentional weight loss over the last 3 months.   On admission tachypneic, hypoxic (O2 sats 75% on room air). CXR shows no CHF and apical masses B/L. Chest CT showed symmetric subpleural soft tissue density at the apices, suspicious for malignancy in the setting of weight loss and emphysema with recommendations for PET-CT.  She is being treated for COPD exac by primary team with iv steroids and breathing treatments. ECG: SR, biatrial enlargement, LVH, no ST T wave abnormalities Labs: BNP 1100, Crea 0.93, Hb 15.6.  Physical exam shows:  A/P High suspicion for a lung cancer, PET CT pending The patient appears euvolemic, I would discontinue iv Lasix and start lasix 40 mg PO daily Acute CHF Hypertension Cocaine abuse  Abstinence from cocaine Echo is pending Increase lisinopril to 10 mg po daily  Tobias Alexander, MD 03/26/2018

## 2018-03-26 NOTE — Progress Notes (Signed)
Bilateral lower extremity venous duplex has been completed. Negative for DVT. Results were given to Dr. Randel Pigg.  03/26/18 9:34 AM Olen Cordial RVT

## 2018-03-27 DIAGNOSIS — J4541 Moderate persistent asthma with (acute) exacerbation: Secondary | ICD-10-CM

## 2018-03-27 DIAGNOSIS — I5031 Acute diastolic (congestive) heart failure: Secondary | ICD-10-CM

## 2018-03-27 LAB — BLOOD GAS, ARTERIAL
Acid-Base Excess: 13.5 mmol/L — ABNORMAL HIGH (ref 0.0–2.0)
Bicarbonate: 39.9 mmol/L — ABNORMAL HIGH (ref 20.0–28.0)
Drawn by: 270211
O2 Content: 2 L/min
O2 SAT: 92.1 %
PATIENT TEMPERATURE: 98.6
pCO2 arterial: 56.2 mmHg — ABNORMAL HIGH (ref 32.0–48.0)
pH, Arterial: 7.464 — ABNORMAL HIGH (ref 7.350–7.450)
pO2, Arterial: 64.8 mmHg — ABNORMAL LOW (ref 83.0–108.0)

## 2018-03-27 LAB — CBC
HEMATOCRIT: 39.6 % (ref 36.0–46.0)
Hemoglobin: 12.8 g/dL (ref 12.0–15.0)
MCH: 30.3 pg (ref 26.0–34.0)
MCHC: 32.3 g/dL (ref 30.0–36.0)
MCV: 93.8 fL (ref 78.0–100.0)
Platelets: 281 10*3/uL (ref 150–400)
RBC: 4.22 MIL/uL (ref 3.87–5.11)
RDW: 14 % (ref 11.5–15.5)
WBC: 9.1 10*3/uL (ref 4.0–10.5)

## 2018-03-27 LAB — HEPATITIS PANEL, ACUTE
HCV Ab: <0.1 s/co ratio (ref 0.0–0.9)
Hep A IgM: NEGATIVE
Hep B C IgM: NEGATIVE
Hepatitis B Surface Ag: NEGATIVE

## 2018-03-27 LAB — BASIC METABOLIC PANEL
Anion gap: 9 (ref 5–15)
BUN: 22 mg/dL — ABNORMAL HIGH (ref 6–20)
CO2: 36 mmol/L — ABNORMAL HIGH (ref 22–32)
Calcium: 9.1 mg/dL (ref 8.9–10.3)
Chloride: 103 mmol/L (ref 98–111)
Creatinine, Ser: 0.87 mg/dL (ref 0.44–1.00)
GFR calc Af Amer: >60 mL/min (ref >60)
Glucose, Bld: 110 mg/dL — ABNORMAL HIGH (ref 70–99)
POTASSIUM: 3.5 mmol/L (ref 3.5–5.1)
Sodium: 148 mmol/L — ABNORMAL HIGH (ref 135–145)

## 2018-03-27 LAB — TROPONIN I
TROPONIN I: 0.03 ng/mL — AB (ref <0.03)
TROPONIN I: 0.03 ng/mL — AB (ref <0.03)

## 2018-03-27 LAB — PROCALCITONIN

## 2018-03-27 LAB — MAGNESIUM: Magnesium: 2.3 mg/dL (ref 1.7–2.4)

## 2018-03-27 MED ORDER — LORAZEPAM 2 MG/ML IJ SOLN
INTRAMUSCULAR | Status: AC
  Filled 2018-03-27: qty 1

## 2018-03-27 MED ORDER — HYDROXYZINE HCL 25 MG PO TABS
25.0000 mg | ORAL_TABLET | Freq: Three times a day (TID) | ORAL | Status: DC | PRN
  Administered 2018-03-27 – 2018-03-29 (×6): 25 mg via ORAL
  Filled 2018-03-27 (×6): qty 1

## 2018-03-27 MED ORDER — METHYLPREDNISOLONE SODIUM SUCC 125 MG IJ SOLR
60.0000 mg | Freq: Two times a day (BID) | INTRAMUSCULAR | Status: DC
  Administered 2018-03-27 – 2018-03-28 (×2): 60 mg via INTRAVENOUS
  Filled 2018-03-27 (×2): qty 2

## 2018-03-27 MED ORDER — CARVEDILOL 6.25 MG PO TABS
6.2500 mg | ORAL_TABLET | Freq: Two times a day (BID) | ORAL | Status: DC
  Administered 2018-03-27: 6.25 mg via ORAL
  Filled 2018-03-27: qty 1

## 2018-03-27 MED ORDER — AMLODIPINE BESYLATE 5 MG PO TABS
5.0000 mg | ORAL_TABLET | Freq: Every day | ORAL | Status: DC
  Administered 2018-03-27 – 2018-03-29 (×3): 5 mg via ORAL
  Filled 2018-03-27 (×3): qty 1

## 2018-03-27 MED ORDER — LISINOPRIL 20 MG PO TABS
20.0000 mg | ORAL_TABLET | Freq: Every day | ORAL | Status: DC
  Administered 2018-03-28: 20 mg via ORAL
  Filled 2018-03-27: qty 1

## 2018-03-27 MED ORDER — LORAZEPAM 2 MG/ML IJ SOLN
0.5000 mg | Freq: Once | INTRAMUSCULAR | Status: AC
  Administered 2018-03-27: 0.5 mg via INTRAVENOUS

## 2018-03-27 NOTE — Progress Notes (Signed)
Pt having panic attack. Demanding staff to fan her. States her panic attacks are triggered when she gets hot. Pt diaphoretic, BP 197/117, oxygen saturation 100% on 4L. PRN hydralazine given. On call provider Schorr made aware of situation. New order for one time dose of ativan. On coming nurse made aware of the need to recheck pt's BP.

## 2018-03-27 NOTE — Progress Notes (Signed)
CRITICAL VALUE ALERT  Critical Value:  Troponin   Date & Time Notied:  0.03  Provider Notified: Schorr NP  Orders Received/Actions taken: continue to monitor

## 2018-03-27 NOTE — Progress Notes (Signed)
Progress Note  Patient Name: Vanessa Wallace Date of Encounter: 03/27/2018  Primary Cardiologist: New patient/ Dr Delton See  Subjective   The patient states that breathing is better today.  Inpatient Medications    Scheduled Meds: . albuterol  5 mg Nebulization Once  . arformoterol  15 mcg Nebulization BID  . aspirin EC  81 mg Oral Daily  . budesonide (PULMICORT) nebulizer solution  0.5 mg Nebulization BID  . carvedilol  6.25 mg Oral BID WC  . DULoxetine  60 mg Oral Daily  . enoxaparin (LOVENOX) injection  30 mg Subcutaneous QHS  . furosemide  40 mg Intravenous BID  . furosemide  40 mg Oral Daily  . ipratropium-albuterol  3 mL Nebulization TID  . lisinopril  10 mg Oral Daily  . montelukast  10 mg Oral QHS  . pantoprazole  40 mg Oral Daily  . predniSONE  40 mg Oral Q breakfast   Continuous Infusions:  PRN Meds: acetaminophen **OR** acetaminophen, albuterol, hydrALAZINE, polyethylene glycol   Vital Signs    Vitals:   03/27/18 0800 03/27/18 0842 03/27/18 0958 03/27/18 1054  BP:   114/81   Pulse:   84 80  Resp:    14  Temp:    98.3 F (36.8 C)  TempSrc:    Oral  SpO2:  98%  100%  Weight: 48.2 kg     Height:        Intake/Output Summary (Last 24 hours) at 03/27/2018 1305 Last data filed at 03/27/2018 0838 Gross per 24 hour  Intake 360 ml  Output 2400 ml  Net -2040 ml   Filed Weights   03/25/18 2350 03/26/18 0447 03/27/18 0800  Weight: 50.1 kg 49.8 kg 48.2 kg    Telemetry    SR - Personally Reviewed  Physical Exam   GEN: No acute distress.   Neck: No JVD Cardiac: RRR, no murmurs, rubs, or gallops.  Respiratory: wheezing B/L GI: Soft, nontender, non-distended  MS: No edema; No deformity. Neuro:  Nonfocal  Psych: Normal affect   Labs    Chemistry Recent Labs  Lab 03/25/18 1926 03/25/18 1957 03/26/18 0433 03/27/18 0104  NA 142 142 142 148*  K 3.8 3.6 4.3 3.5  CL 104 103 105 103  CO2 30  --  27 36*  GLUCOSE 90 87 191* 110*  BUN 25* 25* 23*  22*  CREATININE 1.14* 1.20* 0.93 0.87  CALCIUM 8.9  --  8.7* 9.1  PROT 6.9  --   --   --   ALBUMIN 3.4*  --   --   --   AST 53*  --   --   --   ALT 66*  --   --   --   ALKPHOS 67  --   --   --   BILITOT 0.5  --   --   --   GFRNONAA 51*  --  >60 >60  GFRAA 59*  --  >60 >60  ANIONGAP 8  --  10 9     Hematology Recent Labs  Lab 03/25/18 1926 03/25/18 1957 03/27/18 0104  WBC 5.8  --  9.1  RBC 4.77  --  4.22  HGB 14.3 15.6* 12.8  HCT 44.4 46.0 39.6  MCV 93.1  --  93.8  MCH 30.0  --  30.3  MCHC 32.2  --  32.3  RDW 13.9  --  14.0  PLT 306  --  281    Cardiac Enzymes Recent Labs  Lab  03/25/18 1926 03/26/18 0907 03/27/18 0104 03/27/18 0743  TROPONINI 0.03* <0.03 0.03* 0.03*   No results for input(s): TROPIPOC in the last 168 hours.   BNP Recent Labs  Lab 03/25/18 1927  BNP 1,063.9*     DDimer No results for input(s): DDIMER in the last 168 hours.   Radiology    Dg Chest 2 View  Result Date: 03/25/2018 CLINICAL DATA:  Shortness of breath, history asthma EXAM: CHEST - 2 VIEW COMPARISON:  08/11/2017 FINDINGS: Enlargement of cardiac silhouette. Atherosclerotic calcification aorta. Mediastinal contours and pulmonary vascularity normal. Emphysematous, chronic bronchitic and chronic interstitial changes, stable. No acute infiltrate, pleural effusion or pneumothorax. Pleural based opacities at the lateral apices appear more pronounced bilaterally. Diffuse osseous demineralization. IMPRESSION: Enlargement of cardiac silhouette. Changes of COPD and chronic interstitial disease without acute infiltrate. Increased prominence of pleural based opacities at the laterally proceeds bilaterally; further evaluation by CT chest recommended to exclude biapical masses. Electronically Signed   By: Ulyses SouthwardMark  Boles M.D.   On: 03/25/2018 20:32   Ct Angio Chest Pe W Or Wo Contrast  Result Date: 03/26/2018 CLINICAL DATA:  Dyspnea and bilateral lower extremity swelling. 30 pound unintentional weight  loss. Shortness of breath. EXAM: CT ANGIOGRAPHY CHEST WITH CONTRAST TECHNIQUE: Multidetector CT imaging of the chest was performed using the standard protocol during bolus administration of intravenous contrast. Multiplanar CT image reconstructions and MIPs were obtained to evaluate the vascular anatomy. CONTRAST:  100mL ISOVUE-370 IOPAMIDOL (ISOVUE-370) INJECTION 76% COMPARISON:  Radiograph yesterday.  Chest CT 08/12/2017 FINDINGS: Cardiovascular: There are no filling defects within the pulmonary arteries to suggest pulmonary embolus. Multi chamber cardiomegaly with right greater than left heart dilatation. Aortic atherosclerosis and tortuosity without dissection. There are coronary artery calcifications. No pericardial effusion. Mediastinum/Nodes: Enlarged right hilar node measuring 18 mm, previously 19 mm. Additional prominent right hilar nodes are also unchanged. 12 mm left hilar node previously 13 mm. Elongated AP window node is unchanged measuring 7 mm short axis. No thyroid nodule. The esophagus is nondistended. Lungs/Pleura: Advanced emphysema. Biapical subpleural soft tissue density appears symmetric but is more prominent than on prior CT. Chronic central bronchial thickening most prominent in the lower lobes. Subpleural consolidation in both dependent lower lobes has improved with mild residual linear opacity. No new focal airspace disease. Minimal frothy debris in the dependent trachea and left mainstem bronchus. Apical subpleural density represents intercostal muscles. No pleural fluid or pulmonary edema. Upper Abdomen: Paucity of fat suggesting cachexia. Atherosclerosis of included abdominal vasculature. Adrenal glands are not well visualized. Musculoskeletal: Moderate T8 superior endplate compression fracture is new from prior exam. Chronic compression fracture of T7 with additional scattered Schmorl's nodes throughout the thoracic spine. General paucity of body fat suggesting cachexia. Review of the  MIP images confirms the above findings. IMPRESSION: 1. No pulmonary embolus. 2. Symmetric subpleural soft tissue density at the apices, may be increased conspicuity of intercostal muscles secondary to loss of body fat, however malignancy is not excluded in the setting of weight loss and emphysema. PET-CT could be considered to evaluate for metabolic activity. 3. Persistent or recurrent dependent opacity in the left greater than right lower lobe, less prominent than on prior exam. There is slight frothy debris in the trachea and left mainstem bronchus raising possibility of aspiration. 4. Advanced emphysema. 5. Cardiomegaly with coronary artery calcifications. Aortic atherosclerosis. 6. T8 compression fracture is age indeterminate but new from January 2019 CT. Chronic T7 compression fracture. 7. Paucity of body fat suggest cachexia. 8. Prominent bilateral  hilar lymph nodes, right greater than left, unchanged from prior exam, nonspecific. Aortic Atherosclerosis (ICD10-I70.0) and Emphysema (ICD10-J43.9). Electronically Signed   By: Narda Rutherford M.D.   On: 03/26/2018 02:12    Cardiac Studies   - Left ventricle: The cavity size was normal. Wall thickness was   increased in a pattern of mild LVH. Systolic function was low   normal to mildly reduced. The estimated ejection fraction was in   the range of 50% to 55%. Wall motion was normal; there were no   regional wall motion abnormalities. Doppler parameters are   consistent with abnormal left ventricular relaxation (grade 1   diastolic dysfunction). - Aortic valve: There was no stenosis. - Mitral valve: Mildly calcified annulus. There was trivial   regurgitation. - Right ventricle: The cavity size was mildly dilated. Systolic   function was normal. - Tricuspid valve: Peak RV-RA gradient (S): 52 mm Hg. - Pulmonary arteries: PA peak pressure: 67 mm Hg (S). - Systemic veins: IVC measured 2.9 cm with < 50% respirophasic   variation, suggesting RA  pressure 15 mmHg. - Pericardium, extracardiac: A trivial pericardial effusion was   identified.  Impressions:  - Normal LV size with mild LV hypertrophy. EF 50-55%, low normal to   mildly reduced systolic function. Mildly dilated RV with normal   systolic function. Moderate-severe pulmonary hypertension.   Dilated IVC suggestive of RV pressure/volume overload.  Patient Profile     62 y.o. female   Assessment & Plan    1. RV failure with signs of RV fluid and volume ovearload and moderate pulmonary hypertension - with low normal LVEF and only grade 1 DD, RV failure related to underlying lung disease - significant diureses overnight, she appears euvolemic - continue lasix 20 mg op daily only  2. Hypertension  - possibly related to cocaine use - avoid BB - increase lisinopril to 20 mg po daily - add amlodipine 5 mg po daily - uptitrate as needed - I would d/c carvedilol given wheezing  3. Cocaine abuse   4. Probably lung ca - PET CT is pending  [facility] will sign off.   Medication Recommendations:  As above Other recommendations (labs, testing, etc):  No further testing Follow up as an outpatient:  As needed  For questions or updates, please contact [facility] Please consult [email or web address] for contact info under      Signed, Tobias Alexander, MD  03/27/2018, 1:05 PM

## 2018-03-27 NOTE — Progress Notes (Addendum)
PROGRESS NOTE [facility]   Vanessa Wallace   ZOX:[id number] DOB: 03-Dec-1955  DOA: 03/25/2018 PCP: System, Provider Not In   Brief Narrative:  Vanessa Wallace is a 62 year old female with medical history significant for COPD, asthma, cocaine abuse, depression who presented to the emergency department complaining of shortness of breath and leg swelling for 3 days prior to admission.  Patient with ongoing tobacco abuse and intentional weight loss about 30 pounds in the past 3 months.  Upon ED evaluation she was found to be tachycardic, hypoxic with O2 sat down to 75% on room air, BNP of 1063, mild elevated liver enzymes.  Chest x-ray with mild increasing vascular congestion with apical densities.  CT of the chest shows symmetric soup pleural soft tissue densities in the apices suspicious for malignancy.  PET-CT recommended for further evaluation.  Patient was treated with Lasix, Solu-Medrol and antibiotics and she was admitted with working diagnosis of acute respiratory failure with hypoxia.  Subjective: Patient seen and examined, this morning she had some respiratory distress with wheezing.  Per nursing staff she was slight lethargic and not very responsive.  ABG was performed with no significant acute changes pH 7.64 PCO2 of 56, PaO2 64.  Assessment & Plan: Acute respiratory failure with hypoxia Felt to be multifactorial from COPD, dCHF versus possible lung malignancy.  Clinical findings consistent with some degree of CHF, chest x-ray with mild vascular congestion and cardiomegaly, lower extremity edema, mild JVD and BNP markedly elevated at 1063 in combination of hypoxia and shortness of breath.  Patient diuresed well.  Remains hypoxic and on oxygen.  Continue to treat underlying causes.  COPD exacerbation with ongoing tobacco abuse This morning patient with respiratory distress, ABG as above.  On exam patient very tight and wheezing, will switch prednisone to Solu-Medrol 60 mg twice  daily.  Continue Brovana and Pulmicort.  Continue albuterol as needed.  Been treated with azithromycin as well.  Checking viral panel sent for calcitonin.  Wean oxygen as able.  CT chest consistent with emphysematous changes.  Tobacco cessation discussed.  HFrEF Clinical findings consistent with some degree of CHF, chest x-ray with mild vascular congestion and cardiomegaly, lower extremity edema, mild JVD and BNP markedly elevated at 1063 in combination of hypoxia and shortness of breath.  Patient treated with IV Lasix, with great diuresis negative ~6 L.  Echocardiogram showed EF of 50 to 55%, grade 1 diastolic dysfunction, RV failure and moderate pulmonary hypertension.  Continue Lasix 20 mg p.o. daily as she appears to be euvolemic at this time.  Continue lisinopril increased to 20 mg p.o. daily.  Avoid beta-blockers as patient is cocaine abuser.  Unintentional weight loss Incidental finding on CT chest of apical densities of the lungs.  Concern for malignancy - likely contributing to shortness of breath.  PET/CT has been recommended however unable to perform this while inpatient, will defer to outpatient work-up.  Hypertension BP remains elevated, likely related to cocaine abuse Lisinopril increased to 20 mg daily, adding amlodipine 5 mg daily.  Avoid beta-blockers in setting of cocaine abuse.  Cocaine and tobacco abuse Cessation discussed  Depression Continue current regimen  DVT prophylaxis: Lovenox Code Status: Full code Family Communication: None at bedside Disposition Plan: Home in 1 to 2 days if respiratory status improved  Consultants:   Cardiology  Procedures:   Echocardiogram  Antimicrobials: Anti-infectives (From admission, onward)   Start     Dose/Rate Route Frequency Ordered Stop   03/26/18 0045  azithromycin (ZITHROMAX) tablet  500 mg     500 mg Oral Daily 03/26/18 0041 03/27/18 1002         Objective: Vitals:   03/27/18 0800 03/27/18 0842 03/27/18 0958  03/27/18 1054  BP:   114/81   Pulse:   84 80  Resp:    14  Temp:    98.3 F (36.8 C)  TempSrc:    Oral  SpO2:  98%  100%  Weight: 48.2 kg     Height:        Intake/Output Summary (Last 24 hours) at 03/27/2018 1548 Last data filed at 03/27/2018 1610 Gross per 24 hour  Intake 360 ml  Output 2400 ml  Net -2040 ml   Filed Weights   03/25/18 2350 03/26/18 0447 03/27/18 0800  Weight: 50.1 kg 49.8 kg 48.2 kg    Examination:   General: Pt is alert, awake, not in acute distress Cardiovascular: RRR, S1/S2 +, no rubs, no gallops, no JVD Respiratory: Decreased air entry, very tight and with expiratory wheezing.  No crackles Abdominal: Soft, NT, ND, bowel sounds + Extremities: No LE edema. Skin: No rashes  Neuro: AAOx3, lethargic.   Data Reviewed: I have personally reviewed following labs and imaging studies  CBC: Recent Labs  Lab 03/25/18 1926 03/25/18 1957 03/27/18 0104  WBC 5.8  --  9.1  NEUTROABS 3.2  --   --   HGB 14.3 15.6* 12.8  HCT 44.4 46.0 39.6  MCV 93.1  --  93.8  PLT 306  --  281   Basic Metabolic Panel: Recent Labs  Lab 03/25/18 1926 03/25/18 1957 03/26/18 0433 03/27/18 0104  NA 142 142 142 148*  K 3.8 3.6 4.3 3.5  CL 104 103 105 103  CO2 30  --  27 36*  GLUCOSE 90 87 191* 110*  BUN 25* 25* 23* 22*  CREATININE 1.14* 1.20* 0.93 0.87  CALCIUM 8.9  --  8.7* 9.1  MG  --   --   --  2.3   GFR: Estimated Creatinine Clearance: 52.3 mL/min (by C-G formula based on SCr of 0.87 mg/dL). Liver Function Tests: Recent Labs  Lab 03/25/18 1926  AST 53*  ALT 66*  ALKPHOS 67  BILITOT 0.5  PROT 6.9  ALBUMIN 3.4*   No results for input(s): LIPASE, AMYLASE in the last 168 hours. No results for input(s): AMMONIA in the last 168 hours. Coagulation Profile: No results for input(s): INR, PROTIME in the last 168 hours. Cardiac Enzymes: Recent Labs  Lab 03/25/18 1926 03/26/18 0907 03/27/18 0104 03/27/18 0743  TROPONINI 0.03* <0.03 0.03* 0.03*   BNP (last  3 results) No results for input(s): PROBNP in the last 8760 hours. HbA1C: No results for input(s): HGBA1C in the last 72 hours. CBG: No results for input(s): GLUCAP in the last 168 hours. Lipid Profile: No results for input(s): CHOL, HDL, LDLCALC, TRIG, CHOLHDL, LDLDIRECT in the last 72 hours. Thyroid Function Tests: No results for input(s): TSH, T4TOTAL, FREET4, T3FREE, THYROIDAB in the last 72 hours. Anemia Panel: No results for input(s): VITAMINB12, FOLATE, FERRITIN, TIBC, IRON, RETICCTPCT in the last 72 hours. Sepsis Labs: No results for input(s): PROCALCITON, LATICACIDVEN in the last 168 hours.  No results found for this or any previous visit (from the past 240 hour(s)).    Radiology Studies: Dg Chest 2 View  Result Date: 03/25/2018 CLINICAL DATA:  Shortness of breath, history asthma EXAM: CHEST - 2 VIEW COMPARISON:  08/11/2017 FINDINGS: Enlargement of cardiac silhouette. Atherosclerotic calcification aorta. Mediastinal contours and  pulmonary vascularity normal. Emphysematous, chronic bronchitic and chronic interstitial changes, stable. No acute infiltrate, pleural effusion or pneumothorax. Pleural based opacities at the lateral apices appear more pronounced bilaterally. Diffuse osseous demineralization. IMPRESSION: Enlargement of cardiac silhouette. Changes of COPD and chronic interstitial disease without acute infiltrate. Increased prominence of pleural based opacities at the laterally proceeds bilaterally; further evaluation by CT chest recommended to exclude biapical masses. Electronically Signed   By: Ulyses Southward M.D.   On: 03/25/2018 20:32   Ct Angio Chest Pe W Or Wo Contrast  Result Date: 03/26/2018 CLINICAL DATA:  Dyspnea and bilateral lower extremity swelling. 30 pound unintentional weight loss. Shortness of breath. EXAM: CT ANGIOGRAPHY CHEST WITH CONTRAST TECHNIQUE: Multidetector CT imaging of the chest was performed using the standard protocol during bolus administration of  intravenous contrast. Multiplanar CT image reconstructions and MIPs were obtained to evaluate the vascular anatomy. CONTRAST:  ISOVUE-370 IOPAMIDOL (ISOVUE-370) INJECTION 76% COMPARISON:  Radiograph yesterday.  Chest CT 08/12/2017 FINDINGS: Cardiovascular: There are no filling defects within the pulmonary arteries to suggest pulmonary embolus. Multi chamber cardiomegaly with right greater than left heart dilatation. Aortic atherosclerosis and tortuosity without dissection. There are coronary artery calcifications. No pericardial effusion. Mediastinum/Nodes: Enlarged right hilar node measuring 18 mm, previously 19 mm. Additional prominent right hilar nodes are also unchanged. 12 mm left hilar node previously 13 mm. Elongated AP window node is unchanged measuring 7 mm short axis. No thyroid nodule. The esophagus is nondistended. Lungs/Pleura: Advanced emphysema. Biapical subpleural soft tissue density appears symmetric but is more prominent than on prior CT. Chronic central bronchial thickening most prominent in the lower lobes. Subpleural consolidation in both dependent lower lobes has improved with mild residual linear opacity. No new focal airspace disease. Minimal frothy debris in the dependent trachea and left mainstem bronchus. Apical subpleural density represents intercostal muscles. No pleural fluid or pulmonary edema. Upper Abdomen: Paucity of fat suggesting cachexia. Atherosclerosis of included abdominal vasculature. Adrenal glands are not well visualized. Musculoskeletal: Moderate T8 superior endplate compression fracture is new from prior exam. Chronic compression fracture of T7 with additional scattered Schmorl's nodes throughout the thoracic spine. General paucity of body fat suggesting cachexia. Review of the MIP images confirms the above findings. IMPRESSION: 1. No pulmonary embolus. 2. Symmetric subpleural soft tissue density at the apices, may be increased conspicuity of intercostal muscles  secondary to loss of body fat, however malignancy is not excluded in the setting of weight loss and emphysema. PET-CT could be considered to evaluate for metabolic activity. 3. Persistent or recurrent dependent opacity in the left greater than right lower lobe, less prominent than on prior exam. There is slight frothy debris in the trachea and left mainstem bronchus raising possibility of aspiration. 4. Advanced emphysema. 5. Cardiomegaly with coronary artery calcifications. Aortic atherosclerosis. 6. T8 compression fracture is age indeterminate but new from January 2019 CT. Chronic T7 compression fracture. 7. Paucity of body fat suggest cachexia. 8. Prominent bilateral hilar lymph nodes, right greater than left, unchanged from prior exam, nonspecific. Aortic Atherosclerosis (ICD10-I70.0) and Emphysema (ICD10-J43.9). Electronically Signed   By: Narda Rutherford M.D.   On: 03/26/2018 02:12      Scheduled Meds: . albuterol  5 mg Nebulization Once  . amLODipine  5 mg Oral Daily  . arformoterol  15 mcg Nebulization BID  . aspirin EC  81 mg Oral Daily  . budesonide (PULMICORT) nebulizer solution  0.5 mg Nebulization BID  . DULoxetine  60 mg Oral Daily  . enoxaparin (LOVENOX)  injection  30 mg Subcutaneous QHS  . furosemide  40 mg Oral Daily  . ipratropium-albuterol  3 mL Nebulization TID  . [START ON 03/28/2018] lisinopril  20 mg Oral Daily  . methylPREDNISolone (SOLU-MEDROL) injection  60 mg Intravenous Q12H  . montelukast  10 mg Oral QHS  . pantoprazole  40 mg Oral Daily   Continuous Infusions:   LOS: 2 days    Time spent: 35 minutes   Latrelle DodrillEdwin Silva, MD Pager: Text Page via [email or web address]   If 7PM-7AM, please contact night-coverage [email or web address] 03/27/2018, 3:48 PM   Note - This record has been created using [employer] software. Chart creation errors have been sought, but may not always have been located. Such creation errors do not reflect on the standard of medical care.

## 2018-03-28 ENCOUNTER — Encounter (HOSPITAL_COMMUNITY): Payer: Self-pay | Admitting: Pulmonary Disease

## 2018-03-28 DIAGNOSIS — J9621 Acute and chronic respiratory failure with hypoxia: Secondary | ICD-10-CM

## 2018-03-28 DIAGNOSIS — J441 Chronic obstructive pulmonary disease with (acute) exacerbation: Secondary | ICD-10-CM

## 2018-03-28 DIAGNOSIS — I272 Pulmonary hypertension, unspecified: Secondary | ICD-10-CM

## 2018-03-28 DIAGNOSIS — J9622 Acute and chronic respiratory failure with hypercapnia: Secondary | ICD-10-CM

## 2018-03-28 DIAGNOSIS — E8779 Other fluid overload: Secondary | ICD-10-CM

## 2018-03-28 DIAGNOSIS — J449 Chronic obstructive pulmonary disease, unspecified: Secondary | ICD-10-CM

## 2018-03-28 MED ORDER — AZITHROMYCIN 250 MG PO TABS
500.0000 mg | ORAL_TABLET | Freq: Every day | ORAL | Status: DC
  Administered 2018-03-28 – 2018-03-29 (×2): 500 mg via ORAL
  Filled 2018-03-28 (×2): qty 2

## 2018-03-28 MED ORDER — METHYLPREDNISOLONE SODIUM SUCC 40 MG IJ SOLR
40.0000 mg | Freq: Two times a day (BID) | INTRAMUSCULAR | Status: DC
  Administered 2018-03-28 – 2018-03-29 (×2): 40 mg via INTRAVENOUS
  Filled 2018-03-28 (×2): qty 1

## 2018-03-28 MED ORDER — LORAZEPAM 0.5 MG PO TABS: 0.5000 mg | ORAL_TABLET | Freq: Once | ORAL | Status: DC

## 2018-03-28 MED ORDER — IPRATROPIUM-ALBUTEROL 0.5-2.5 (3) MG/3ML IN SOLN
3.0000 mL | RESPIRATORY_TRACT | Status: DC
  Administered 2018-03-28 – 2018-03-29 (×6): 3 mL via RESPIRATORY_TRACT
  Filled 2018-03-28 (×6): qty 3

## 2018-03-28 MED ORDER — FUROSEMIDE 20 MG PO TABS
20.0000 mg | ORAL_TABLET | Freq: Every day | ORAL | Status: DC
  Administered 2018-03-29: 20 mg via ORAL
  Filled 2018-03-28: qty 1

## 2018-03-28 NOTE — Consult Note (Addendum)
Vanessa Wallace  WUJ:[id number] DOB: Mar 20, 1956 DOA: 03/25/2018 PCP: System, Provider Not In    LOS: 3 days   Reason for Consult / Chief Complaint:  AECOPD   Consulting MD and date:  Dr. Arlean Hopping / [facility]   HPI/Summary of hospital stay:  62 y/o F, current smoker, with PMH of COPD and cocaine abuse admitted on 9/10 with reports of shortness of breath and lower extremity swelling up to her thighs.  She also reported cough with some yellow sputum production.  ER evaluation notable for tachycardia, tachypnea and O2 sats to 75% on RA.  BNP elevated to 1063, mild elevation of LFT's.  CXR without acute infiltrate.  CTA of the chest completed which was negative for PE but showed symmetric subpleural soft tissue density at the apices (could be intercostal muscle prominence in the setting of loss of body fat), persistent or recurrent dependent opacity L>R lower lobes, frothy debris in the trachea and left mainstem bronchus raises the possiblility of aspiration, advanced emphysema, cardiomegaly with coronary artery calcifications, T8 indeterminate age compression fracture, chronic T7, prominent bilateral hilar lymph nodes R>L unchanged.    The patient was admitted per [facility] for concern of AECOPD and volume overload.  ECHO 9/11 showed mild LVH, LVEF 50-55%, normal wall motion, no RWMA, grade 1 diastolic dysfunction, RV mildly dilated, PA peak pressure 67. She was diuresed 6L with improvement in symptoms.  UDS was positive for cocaine (has been dating back to 2010).  Additionally, she was treated with nebulized bronchodilators and IV steroids.  The patient was slow to recover.  PCCM consulted on 9/13 for evaluation.    Subjective:  Pt reports she is ready to go home.  Reports back pain from chronic fractures.    Objective   Blood pressure (!) 148/89, pulse 90, temperature 98.9 F (37.2 C), temperature source Oral, resp. rate (!) 22, height 5\' 2"  (1.575 m), weight 43.7 kg, SpO2 94 %.        Intake/Output  Summary (Last 24 hours) at 03/28/2018 1320 Last data filed at 03/28/2018 1200 Gross per 24 hour  Intake 360 ml  Output 600 ml  Net -240 ml   Filed Weights   03/26/18 0447 03/27/18 0800 03/28/18 0706  Weight: 49.8 kg 48.2 kg 43.7 kg    Examination: General: cachectic female sitting up in bed HENT: MM pink/moist, mild JVD+ Lungs: accessory muscle use but able to speak sentences clearly, changes in chest wall consistent with emphysema, no wheezing or crackles Cardiovascular: s1s2 rrr, no m/r/g Abdomen: flat, soft, bsx4 active  Extremities: warm/dry, LE changes consistent with venous insufficiency / post diuresis   Neuro: AAOx4, speech clear, MAE  GU: deferred  Consults: date of consult/date signed off & final recs:  PCCM 9/13   Procedures: UDS 9/13 >> cocaine positive CTA Chest 9/11 >> negative for PE but showed symmetric subpleural soft tissue density at the apices (could be intercostal muscle prominence in the setting of loss of body fat), persistent or recurrent dependent opacity L>R lower lobes, frothy debris in the trachea and left mainstem bronchus raises the possiblility of aspiration, advanced emphysema, cardiomegaly with coronary artery calcifications, T8 indeterminate age compression fracture, chronic T7, prominent bilateral hilar lymph nodes R>L unchanged.     Significant Diagnostic Tests: ECHO 9/11 >> mild LVH, LVEF 50-55%, normal wall motion, no RWMA, grade 1 diastolic dysfunction, RV mildly dilated, PA peak pressure 67  Micro Data: RVP 9/11 >>   Antimicrobials:     Resolved  Hospital Problem list    Assessment & Plan:   Acute Exacerbation of COPD - in setting of cocaine abuse.  Pending RVP P: Continue Brovana + Pulmicort  Solumedrol 40 mg IV BID  Duoneb TID  Add azithromycin for 5 days given exacerbation and antiinflammatory effect    Acute Hypoxemic Respiratory Failure - in setting of AECOPD, cocaine abuse, diastolic dysfunction with component of initial  volume overload (now 6L down), and pulmonary hypertension  P: Diuresis per primary, appears to be reaching euvolemic state  Wean O2 for sats > 90% Assess ambulatory O2 needs prior to discharge  PT / OT consult for home needs    Sub-Pleural Apical Masses  Weight Loss - concern for malignancy vs end stage pulmonary disease or both P: Pt will need outpatient PET scan to assess for hypermetabolic activity Would be high risk candidate for any procedure    Anxiety - in setting of dyspnea, COPD  P: One time dose ativan now for dyspnea  If tolerates above consider home dosing    Goals of Care  P: Reviewed concept of intubation / CPR with patient.  She indicates she does not want to answer.  She wants her son to make the decision.  I encouraged her to talk with her family to let them know her wishes.   Consider palliative care consult for overall goals of care discussion given severe COPD   Disposition / Summary of Today's Plan 03/28/18   Continue current plan of care.  Add azithromycin.  Trial one time dose ativan for anxiety.     DVT prophylaxis: enoxaparin  GI prophylaxis: not indicated  Diet: As tolerated  Mobility:As tolerated  Code Status: Full Code  Family Communication: Patient updated on plan of care 9/13.    Labs   CBC: Recent Labs  Lab 03/25/18 1926 03/25/18 1957 03/27/18 0104  WBC 5.8  --  9.1  NEUTROABS 3.2  --   --   HGB 14.3 15.6* 12.8  HCT 44.4 46.0 39.6  MCV 93.1  --  93.8  PLT 306  --  281   Basic Metabolic Panel: Recent Labs  Lab 03/25/18 1926 03/25/18 1957 03/26/18 0433 03/27/18 0104  NA 142 142 142 148*  K 3.8 3.6 4.3 3.5  CL 104 103 105 103  CO2 30  --  27 36*  GLUCOSE 90 87 191* 110*  BUN 25* 25* 23* 22*  CREATININE 1.14* 1.20* 0.93 0.87  CALCIUM 8.9  --  8.7* 9.1  MG  --   --   --  2.3   GFR: Estimated Creatinine Clearance: 47.4 mL/min (by C-G formula based on SCr of 0.87 mg/dL). Recent Labs  Lab 03/25/18 1926 03/27/18 0104  03/27/18 1604  PROCALCITON  --   --  <0.10  WBC 5.8 9.1  --    Liver Function Tests: Recent Labs  Lab 03/25/18 1926  AST 53*  ALT 66*  ALKPHOS 67  BILITOT 0.5  PROT 6.9  ALBUMIN 3.4*   No results for input(s): LIPASE, AMYLASE in the last 168 hours. No results for input(s): AMMONIA in the last 168 hours. ABG    Component Value Date/Time   PHART 7.464 (H) 03/27/2018 1030   PCO2ART 56.2 (H) 03/27/2018 1030   PO2ART 64.8 (L) 03/27/2018 1030   HCO3 39.9 (H) 03/27/2018 1030   TCO2 28 03/25/2018 1957   ACIDBASEDEF 2.0 08/08/2017 0512   O2SAT 92.1 03/27/2018 1030    Coagulation Profile: No results for input(s): INR, PROTIME  in the last 168 hours. Cardiac Enzymes: Recent Labs  Lab 03/25/18 1926 03/26/18 0907 03/27/18 0104 03/27/18 0743  TROPONINI 0.03* <0.03 0.03* 0.03*   HbA1C: Hgb A1c MFr Bld  Date/Time Value Ref Range Status  08/30/2016 06:46 PM 5.5 4.8 - 5.6 % Final    Comment:    (NOTE)         Pre-diabetes: 5.7 - 6.4         Diabetes: >6.4         Glycemic control for adults with diabetes: <7.0    CBG: No results for input(s): GLUCAP in the last 168 hours.   Review of Systems:    Past medical history  She,  has a past medical history of Anxiety, Arthritis, Asthma, CAP (community acquired pneumonia) (09/17/2013), Chronic bronchitis (HCC), COPD (chronic obstructive pulmonary disease) (HCC), Hypertension, and Migraines.   Surgical History    Past Surgical History:  Procedure Laterality Date  . ABDOMINAL HYSTERECTOMY     "partial"  . BUNIONECTOMY Bilateral    "right one came back after OR" (09/17/2013)  . LACERATION REPAIR Left 06/1999   Repair FPL, radial digital nerve, intrinsics, left thumb./notes 06/29/1999 (09/17/2013)  . TUBAL LIGATION       Social History   Social History   Socioeconomic History  . Marital status: Single    Spouse name: Not on file  . Number of children: Not on file  . Years of education: Not on file  . Highest education  level: Not on file  Occupational History  . Not on file  Social Needs  . Financial resource strain: Not on file  . Food insecurity:    Worry: Not on file    Inability: Not on file  . Transportation needs:    Medical: Not on file    Non-medical: Not on file  Tobacco Use  . Smoking status: Former Smoker    Packs/day: 0.30    Years: 40.00    Pack years: 12.00    Types: Cigarettes    Last attempt to quit: 09/18/2014    Years since quitting: 3.5  . Smokeless tobacco: Never Used  . Tobacco comment: 09/17/2013 "weaned down from 2 ppd of cigarettes"  Substance and Sexual Activity  . Alcohol use: No    Alcohol/week: 6.0 standard drinks    Types: 6 Cans of beer per week    Comment: Quit 2 weeks  . Drug use: Yes    Types: Marijuana, Cocaine    Comment: 09/17/2013 " Previous history of cocaine abuse; "quit over 1 yr ago"  . Sexual activity: Yes  Lifestyle  . Physical activity:    Days per week: Not on file    Minutes per session: Not on file  . Stress: Not on file  Relationships  . Social connections:    Talks on phone: Not on file    Gets together: Not on file    Attends religious service: Not on file    Active member of club or organization: Not on file    Attends meetings of clubs or organizations: Not on file    Relationship status: Not on file  . Intimate partner violence:    Fear of current or ex partner: Not on file    Emotionally abused: Not on file    Physically abused: Not on file    Forced sexual activity: Not on file  Other Topics Concern  . Not on file  Social History Narrative  . Not on file  ,  reports that she quit smoking about 3 years ago. Her smoking use included cigarettes. She has a 12.00 pack-year smoking history. She has never used smokeless tobacco. She reports that she has current or past drug history. Drugs: Marijuana and Cocaine. She reports that she does not drink alcohol.   Family history   Her family history includes Brain cancer in her mother.    Allergies No Known Allergies  Home meds  Prior to Admission medications   Medication Sig Start Date End Date Taking? Authorizing Provider  albuterol (PROVENTIL HFA;VENTOLIN HFA) 108 (90 Base) MCG/ACT inhaler Inhale 1-2 puffs into the lungs every 6 (six) hours as needed for wheezing or shortness of breath. 08/15/17  Yes Penny Pia, MD  albuterol (PROVENTIL) (2.5 MG/3ML) 0.083% nebulizer solution Take 2.5 mg by nebulization every 6 (six) hours as needed for wheezing or shortness of breath.    Yes [provider]  aspirin 81 MG tablet Take 81 mg by mouth daily.   Yes [provider]  budesonide-formoterol (SYMBICORT) 160-4.5 MCG/ACT inhaler Inhale 2 puffs into the lungs 2 (two) times daily. 09/01/16  Yes [name], MD  clonazePAM (KLONOPIN) 0.5 MG tablet Take 1 tablet by mouth 2 (two) times daily as needed for anxiety. 01/31/18  Yes [provider]  DULoxetine (CYMBALTA) 60 MG capsule Take 60 mg by mouth daily. 07/22/17  Yes [provider]  ferrous sulfate 325 (65 FE) MG tablet Take 1 tablet (325 mg total) by mouth daily with breakfast. 09/01/16  Yes [name], MD  lisinopril (PRINIVIL,ZESTRIL) 5 MG tablet Take 5 mg by mouth daily. 07/13/17  Yes [provider]  montelukast (SINGULAIR) 10 MG tablet Take 10 mg by mouth at bedtime.   Yes [provider]  omeprazole (PRILOSEC) 20 MG capsule Take 20 mg by mouth daily.   Yes [provider]  amoxicillin-clavulanate (AUGMENTIN) 875-125 MG tablet Take 1 tablet by mouth every 12 (twelve) hours. Patient not taking: Reported on 03/25/2018 08/15/17   Penny Pia, MD  buPROPion (WELLBUTRIN XL) 150 MG 24 hr tablet Take 1 tablet (150 mg total) by mouth daily. Patient not taking: Reported on 03/25/2018 08/16/17   Penny Pia, MD  predniSONE (DELTASONE) 10 MG tablet Take 1 tablet (10 mg total) by mouth as directed. Take 40 mg for the next 2 days, then 30 mg po for the next 2 days,  then take 20 mg po daily for the next 2 days, then take 10 mg po for the next 2 days. Patient not taking: Reported on 03/25/2018 08/15/17   Penny Pia, MD       Canary Brim, NP-C [facility] Pgr: [phone] or if no answer [phone] 03/28/2018, 1:20 PM

## 2018-03-28 NOTE — Clinical Social Work Note (Signed)
Clinical Social Work Assessment  Patient Details  Name: Vanessa Wallace MRN: [id number] Date of Birth: 1955-08-21  Date of referral:  03/26/18               Reason for consult:  Substance Use/ETOH Abuse                Permission sought to share information with:    Permission granted to share information::  No  Name::     n/a  Agency::  n/a  Relationship::     Contact Information:     Housing/Transportation Living arrangements for the past 2 months:  Single Family Home Source of Information:  Patient Patient Interpreter Needed:  None Criminal Activity/Legal Involvement Pertinent to Current Situation/Hospitalization:  No - Comment as needed Significant Relationships:  Friend Lives with:  Friends Do you feel safe going back to the place where you live?  Yes Need for family participation in patient care:  No (Coment)  Care giving concerns:  Patient lives with friend and experiences high levels of back pain, and panic attacks. Patient tested positive for cocaine at admission.   Social Worker assessment / plan:  CSW met with patient to discuss substance abuse and positive cocaine screen. Patient was open about cocaine usage and said she has been using for the past "few months." Patient says she uses cocaine to manage her pain because she does not currently have medication that adequately helps with her pain levels.   Patient lives with a friend and did not speak about any other support systems. Patient was receptive to CSW leaving substance abuse community resource list with her. However, she expressed she will likely not stop using cocaine until something else can manage her pain. She discussed her history with panic attacks and her struggle to manage them, as well.   CSW explained some potential treatment options to patient in case she wants to pursue any of those options in the future. Patient was appreciative and kept list of information.   No more CSW needs. Signing off.    Employment status:    Insurance information:  Medicaid In [place] PT Recommendations:  Not assessed at this time Information / Referral to community resources:  Residential Substance Abuse Treatment Options, Outpatient Substance Abuse Treatment Options  Patient/Family's Response to care:  Patient expressed she has been using cocaine to cope with back pain. She is hoping to be prescribed medication to help with pain, anxiety, and panic attacks. Patient responded appropriately to CSW's questions and concerns about substance abuse but denied wanting treatment at this time.  Patient/Family's Understanding of and Emotional Response to Diagnosis, Current Treatment, and Prognosis:  Patient understood substance abuse treatment options provided by CSW. Patient does not plan to seek help at this time but was appreciative of CSW and kept list of resources.  Emotional Assessment Appearance:  Appears stated age Attitude/Demeanor/Rapport:    Affect (typically observed):  Accepting, Appropriate Orientation:  Oriented to Self, Oriented to Place, Oriented to  Time, Oriented to Situation Alcohol / Substance use:  Illicit Drugs Psych involvement (Current and /or in the community):  No (Comment)  Discharge Needs  Concerns to be addressed:  Substance Abuse Concerns Readmission within the last 30 days:  No Current discharge risk:  Substance Abuse Barriers to Discharge:  Active Substance Use   Pricilla Holm, LCSWA 03/28/2018, 2:28 PM

## 2018-03-28 NOTE — Progress Notes (Signed)
PROGRESS NOTE [facility]   Vanessa FordVivian A Taha   WUJ:[id number] DOB: [birth year]  DOA: 03/25/2018 PCP: System, Provider Not In   Brief Narrative:  Vanessa Wallace is a 62 year old female with medical history significant for COPD, asthma, cocaine abuse, depression who presented to the emergency department complaining of shortness of breath and leg swelling for 3 days prior to admission.  Patient with ongoing tobacco abuse and intentional weight loss about 30 pounds in the past 3 months.  Upon ED evaluation she was found to be tachycardic, hypoxic with O2 sat down to 75% on room air, BNP of 1063, mild elevated liver enzymes.  Chest x-ray with mild increasing vascular congestion with apical densities.  CT of the chest shows symmetric soup pleural soft tissue densities in the apices suspicious for malignancy.  PET-CT recommended for further evaluation.  Patient was treated with Lasix, Solu-Medrol and antibiotics and she was admitted with working diagnosis of acute respiratory failure with hypoxia.  Subjective: No new c/o's, wants to go home.    Assessment & Plan: Acute respiratory failure with hypoxia/ COPD/ R sided CHF:  - seen by cardiology, appreciate assistance - advanced emphysema by lung CT and also by exam. Moving very little air, but not in distress, not sure what her baseline is like > has not seen pulm MD, will consult CCM for input, prognosis - decrease IV solumedrol to 40 bid - cont O2/ nebs  COPD exacerbation with ongoing tobacco abuse - still having sig resp issues, consulting CCM - cont IV steroids - cont sched duoneb, prn albuterol - also on pulmicort nebs/ singulair/ brovana, defer to CCM   HFrEF - eCHO shows LVEF 50-55%, sig pulm HTN, dilated RV and dilated IVC - high BNP 1063 - got IV lasix w/ very good response > 5-6 L off and wt's down accordingly - seen by cardiology, appreciate assistance - is now taking lasix 20 po qd, lower ext edema resolved - avoid BB due  to cocaine abuse  Unintentional weight loss - Incidental finding on CT chest of apical densities of the lungs.  Concern for malignancy - likely contributing to shortness of breath.  PET/CT has been recommended however unable to perform this while inpatient, will defer to outpatient work-up  Hypertension - BP remains elevated, likely related to cocaine abuse - lisinopril increased to 20 mg daily - added amlodipine 5 mg daily  - avoid beta-blockers in setting of cocaine abuse.  Cocaine and tobacco abuse - Cessation discussed - UDS +cocaine  Depression Continue current regimen  DVT prophylaxis: Lovenox Code Status: full code Family Communication: none at bedside, called son and left message on vm Disposition Plan: pending pulm consult  Vinson Moselleob Lunden Stieber MD [facility] pgr [phone] 12/08/2017, 9:25 AM     Consultants:   Cardiology  Pulm - pending  Procedures:   Echocardiogram  Antimicrobials: Anti-infectives (From admission, onward)   Start     Dose/Rate Route Frequency Ordered Stop   03/26/18 0045  azithromycin (ZITHROMAX) tablet 500 mg     500 mg Oral Daily 03/26/18 0041 03/27/18 1002         Objective: Vitals:   03/28/18 0549 03/28/18 0706 03/28/18 0906 03/28/18 0952  BP: (!) 167/108   (!) 195/106  Pulse: 82  72 86  Resp: 18  17   Temp: 98.8 F (37.1 C)     TempSrc: Oral     SpO2: 90%  96%   Weight:  43.7 kg    Height:  Intake/Output Summary (Last 24 hours) at 03/28/2018 1301 Last data filed at 03/28/2018 1200 Gross per 24 hour  Intake 360 ml  Output 600 ml  Net -240 ml   Filed Weights   03/26/18 0447 03/27/18 0800 03/28/18 0706  Weight: 49.8 kg 48.2 kg 43.7 kg    Examination: General: Pt is alert, awake, not in acute distress Cardiovascular: RRR, S1/S2 +, no rubs, no gallops, no JVD Respiratory: bilat decreased BS throughout, not moving much air at all, using acc muscles, not in severe distress Abdominal: Soft, NT, ND,  bowel sounds + Extremities: No LE edema. Skin: No rashes  Neuro: AAOx3, lethargic.   Data Reviewed: I have personally reviewed following labs and imaging studies  CBC: Recent Labs  Lab 03/25/18 1926 03/25/18 1957 03/27/18 0104  WBC 5.8  --  9.1  NEUTROABS 3.2  --   --   HGB 14.3 15.6* 12.8  HCT 44.4 46.0 39.6  MCV 93.1  --  93.8  PLT 306  --  281   Basic Metabolic Panel: Recent Labs  Lab 03/25/18 1926 03/25/18 1957 03/26/18 0433 03/27/18 0104  NA 142 142 142 148*  K 3.8 3.6 4.3 3.5  CL 104 103 105 103  CO2 30  --  27 36*  GLUCOSE 90 87 191* 110*  BUN 25* 25* 23* 22*  CREATININE 1.14* 1.20* 0.93 0.87  CALCIUM 8.9  --  8.7* 9.1  MG  --   --   --  2.3   GFR: Estimated Creatinine Clearance: 47.4 mL/min (by C-G formula based on SCr of 0.87 mg/dL). Liver Function Tests: Recent Labs  Lab 03/25/18 1926  AST 53*  ALT 66*  ALKPHOS 67  BILITOT 0.5  PROT 6.9  ALBUMIN 3.4*   No results for input(s): LIPASE, AMYLASE in the last 168 hours. No results for input(s): AMMONIA in the last 168 hours. Coagulation Profile: No results for input(s): INR, PROTIME in the last 168 hours. Cardiac Enzymes: Recent Labs  Lab 03/25/18 1926 03/26/18 0907 03/27/18 0104 03/27/18 0743  TROPONINI 0.03* <0.03 0.03* 0.03*   BNP (last 3 results) No results for input(s): PROBNP in the last 8760 hours. HbA1C: No results for input(s): HGBA1C in the last 72 hours. CBG: No results for input(s): GLUCAP in the last 168 hours. Lipid Profile: No results for input(s): CHOL, HDL, LDLCALC, TRIG, CHOLHDL, LDLDIRECT in the last 72 hours. Thyroid Function Tests: No results for input(s): TSH, T4TOTAL, FREET4, T3FREE, THYROIDAB in the last 72 hours. Anemia Panel: No results for input(s): VITAMINB12, FOLATE, FERRITIN, TIBC, IRON, RETICCTPCT in the last 72 hours. Sepsis Labs: Recent Labs  Lab 03/27/18 1604  PROCALCITON <0.10    No results found for this or any previous visit (from the past 240  hour(s)).    Radiology Studies: No results found.    Scheduled Meds: . albuterol  5 mg Nebulization Once  . amLODipine  5 mg Oral Daily  . arformoterol  15 mcg Nebulization BID  . aspirin EC  81 mg Oral Daily  . budesonide (PULMICORT) nebulizer solution  0.5 mg Nebulization BID  . DULoxetine  60 mg Oral Daily  . enoxaparin (LOVENOX) injection  30 mg Subcutaneous QHS  . [START ON 03/29/2018] furosemide  20 mg Oral Daily  . ipratropium-albuterol  3 mL Nebulization TID  . lisinopril  20 mg Oral Daily  . methylPREDNISolone (SOLU-MEDROL) injection  40 mg Intravenous Q12H  . montelukast  10 mg Oral QHS  . pantoprazole  40 mg Oral  Daily   Continuous Infusions:   LOS: 3 days

## 2018-03-29 DIAGNOSIS — Z66 Do not resuscitate: Secondary | ICD-10-CM

## 2018-03-29 MED ORDER — PREDNISONE 10 MG PO TABS: 40.0000 mg | ORAL_TABLET | Freq: Every day | ORAL | 0 refills | Status: AC

## 2018-03-29 MED ORDER — LORAZEPAM 0.5 MG PO TABS: 0.5000 mg | ORAL_TABLET | Freq: Three times a day (TID) | ORAL | 1 refills | Status: DC | PRN

## 2018-03-29 MED ORDER — AZITHROMYCIN 250 MG PO TABS: 250.0000 mg | ORAL_TABLET | Freq: Every day | ORAL | 0 refills | Status: AC

## 2018-03-29 MED ORDER — FUROSEMIDE 20 MG PO TABS: 20.0000 mg | ORAL_TABLET | Freq: Every day | ORAL | 2 refills | Status: AC

## 2018-03-29 MED ORDER — AMLODIPINE BESYLATE 5 MG PO TABS: 5.0000 mg | ORAL_TABLET | Freq: Every day | ORAL | 2 refills | Status: DC

## 2018-03-29 MED ORDER — LISINOPRIL 20 MG PO TABS: 20.0000 mg | ORAL_TABLET | Freq: Every day | ORAL | 11 refills | Status: DC

## 2018-03-29 NOTE — Progress Notes (Signed)
SATURATION QUALIFICATIONS: (This note is used to comply with regulatory documentation for home oxygen)  Patient Saturations on Room Air at Rest = 88-90%  Patient Saturations on Room Air while Ambulating = 85%  Patient Saturations on 2 Liters of oxygen while Ambulating = 94%  Please briefly explain why patient needs home oxygen: Patient desaturates to 88 while at rest and 84% while ambulating.

## 2018-03-29 NOTE — Progress Notes (Signed)
Reviewed discharge information with patient . Answered all questions. Pt able to teach back medications and reasons to contact MD/911. Patient verbalizes importance of PCP follow up appointment.  Kushi Kun M. Tyona Nilsen, RN  

## 2018-03-29 NOTE — Discharge Summary (Signed)
Physician Discharge Summary  Patient ID: Vanessa Wallace MRN: [id number] DOB/AGE: 1955-07-30 62 y.o.  Admit date: 03/25/2018 Discharge date: 03/29/2018  Admission Diagnoses: Principal Problem:   COPD exacerbation (HCC)   Fluid overload   CHF acute   Cocaine use   Asthma exacerbation   Depression   Wt loss   Hypertension   Discharge Diagnoses:  Principal Problem:   Acute resp failure w/ hypoxemia   Severe COPD   CHF R sided   HFrEF   Pulm HTN   Vol overload   Cocaine use   COPD exacerbation (HCC)   Depression   DNR   Wt loss   HTN    Discharged Condition: fair  Presentation Summary: Vanessa Wallace is a 62 year old female with medical history significant for COPD, asthma, cocaine abuse, depression who presented to the emergency department complaining of shortness of breath and leg swelling for 3 days prior to admission.  Patient with ongoing tobacco abuse and intentional weight loss about 30 pounds in the past 3 months.  Upon ED evaluation she was found to be tachycardic, hypoxic with O2 sat down to 75% on room air, BNP of 1063, mild elevated liver enzymes.  Chest x-ray with mild increasing vascular congestion with apical densities.  CT of the chest shows symmetric soup pleural soft tissue densities in the apices suspicious for malignancy.  PET-CT recommended for further evaluation.  Patient was treated with Lasix, Solu-Medrol and antibiotics and she was admitted with working diagnosis of acute respiratory failure with hypoxia.  Hospital Course/ Problems:  Acute respiratory failure with hypoxia/ COPD/ R sided CHF:  - seen by cardiology and diuresed successfully - ECHO showed EF 50-55%, pulm HTN, dilated RV - seen by CCM/ Pulm , pt has severe COPD, acutely pt has improved and is now back to a "very bad" baseline - GOC d/w patient today and she requests DNR status, doesn't want to be put on life support, "just let me go" when my time comes.  RN Nehemiah Settle present for  conversation.  - pt demanding dc home today; pt is at baseline per CCM, but basline if not real good.  Pt understands prognosis and declined pall care eval.   - called son but got voicemail , left message - will need home O2, pt declined initially but we told her she could just use it prn so she agreed  - dc on pred 40 x 7d - resume home symbicort/ singulair/ prn albuterol nebs   COPD exacerbation with ongoing tobacco abuse - as above   HFrEF - eCHO shows LVEF 50-55%, sig pulm HTN, dilated RV and dilated IVC - high BNP 1063 - got IV lasix w/ very good response > 5-6 L off and wt's down accordingly - seen by cardiology, appreciate assistance - dc on po lasix 20 po qd - avoid BB due to cocaine abuse  Unintentional weight loss - Incidental finding on CT chest of apical densities of the lungs.  Concern for malignancy - likely contributing to shortness of breath.  PET/CT has been recommended however unable to perform this while inpatient, defer to OP setting/ PCP  Hypertension - BP remains elevated, likely related to cocaine abuse - lisinopril increased to 20 mg daily - added amlodipine 5 mg daily  - avoid beta-blockers in setting of cocaine abuse.  Cocaine and tobacco abuse - Cessation discussed - UDS +cocaine  Depression Continue current regimen   Discharge Exam: Blood pressure (!) 148/89, pulse 90, temperature 98.9 F (37.2  C), temperature source Oral, resp. rate (!) 22, height 5\' 2"  (1.575 m), weight 43.7 kg, SpO2 (!) 64 %. Frail cachectic AAF, no distress  nasal O2  chest bilat coarse scattered rales, occ wheezing, poor air movement in gen  cor reg no mrg  abd soft no ascites  ext no edema  nonfocal O x3   Disposition: Discharge disposition: 01-Home or Self Care        Allergies as of 03/29/2018   No Known Allergies     Medication List    STOP taking these medications   amoxicillin-clavulanate 875-125 MG tablet Commonly known as:  AUGMENTIN    buPROPion 150 MG 24 hr tablet Commonly known as:  WELLBUTRIN XL     TAKE these medications   albuterol (2.5 MG/3ML) 0.083% nebulizer solution Commonly known as:  PROVENTIL Take 2.5 mg by nebulization every 6 (six) hours as needed for wheezing or shortness of breath.   albuterol 108 (90 Base) MCG/ACT inhaler Commonly known as:  PROVENTIL HFA;VENTOLIN HFA Inhale 1-2 puffs into the lungs every 6 (six) hours as needed for wheezing or shortness of breath.   amLODipine 5 MG tablet Commonly known as:  NORVASC Take 1 tablet (5 mg total) by mouth daily. Start taking on:  03/30/2018   aspirin 81 MG tablet Take 81 mg by mouth daily.   azithromycin 250 MG tablet Commonly known as:  ZITHROMAX Take 1 tablet (250 mg total) by mouth daily for 4 days. Start taking on:  03/30/2018   budesonide-formoterol 160-4.5 MCG/ACT inhaler Commonly known as:  SYMBICORT Inhale 2 puffs into the lungs 2 (two) times daily.   clonazePAM 0.5 MG tablet Commonly known as:  KLONOPIN Take 1 tablet by mouth 2 (two) times daily as needed for anxiety.   DULoxetine 60 MG capsule Commonly known as:  CYMBALTA Take 60 mg by mouth daily.   ferrous sulfate 325 (65 FE) MG tablet Take 1 tablet (325 mg total) by mouth daily with breakfast.   furosemide 20 MG tablet Commonly known as:  LASIX Take 1 tablet (20 mg total) by mouth daily. Start taking on:  03/30/2018   lisinopril 20 MG tablet Commonly known as:  PRINIVIL,ZESTRIL Take 1 tablet (20 mg total) by mouth daily. What changed:    medication strength  how much to take   LORazepam 0.5 MG tablet Commonly known as:  ATIVAN Take 1 tablet (0.5 mg total) by mouth every 8 (eight) hours as needed for up to 30 doses for anxiety.   montelukast 10 MG tablet Commonly known as:  SINGULAIR Take 10 mg by mouth at bedtime.   omeprazole 20 MG capsule Commonly known as:  PRILOSEC Take 20 mg by mouth daily.   predniSONE 10 MG tablet Commonly known as:   DELTASONE Take 4 tablets (40 mg total) by mouth daily with breakfast for 7 days. What changed:    how much to take  when to take this  additional instructions            Durable Medical Equipment  (From admission, onward)         Start     Ordered   03/29/18 1116  For home use only DME oxygen  Once    Question Answer Comment  Mode or (Route) Nasal cannula   Liters per Minute 2   Frequency Continuous (stationary and portable oxygen unit needed)   Oxygen conserving device No   Oxygen delivery system Gas      03/29/18 1115  Signed: Barbette HairRobert D Honesti Seaberg 03/29/2018, 11:56 AM

## 2018-03-29 NOTE — Care Management Note (Signed)
Case Management Note  Patient Details  Name: Vanessa Wallace MRN: [id number] Date of Birth: 03-03-1956  Subjective/Objective:     COPD exac               Action/Plan: NCM spoke to pt and states she has neb machine at home. Contacted [facility] for oxygen for home. Will deliver portable oxygen to room and concentrator to home.   Expected Discharge Date:  03/29/18               Expected Discharge Plan:  Home/Self Care  In-House Referral:  NA  Discharge planning Services  CM Consult  Post Acute Care Choice:  NA Choice offered to:  NA  DME Arranged:  Oxygen DME Agency:  [facility]  HH Arranged:  NA HH Agency:  NA  Status of Service:  Completed, signed off  If discussed at Long Length of Stay Meetings, dates discussed:    Additional Comments:  Vanessa Wallace, Vanessa Goodhart Ellen, RN 03/29/2018, 12:37 PM

## 2018-04-04 ENCOUNTER — Inpatient Hospital Stay: Payer: Medicaid Other | Admitting: Nurse Practitioner

## 2018-04-04 ENCOUNTER — Inpatient Hospital Stay: Payer: Medicaid Other | Admitting: Pulmonary Disease

## 2018-06-17 ENCOUNTER — Encounter: Payer: Self-pay | Admitting: Pulmonary Disease

## 2018-06-17 ENCOUNTER — Ambulatory Visit (INDEPENDENT_AMBULATORY_CARE_PROVIDER_SITE_OTHER): Payer: Medicaid Other | Admitting: Pulmonary Disease

## 2018-06-17 VITALS — BP 144/72 | HR 103 | Ht 62.0 in | Wt 104.0 lb

## 2018-06-17 DIAGNOSIS — J439 Emphysema, unspecified: Secondary | ICD-10-CM

## 2018-06-17 DIAGNOSIS — R911 Solitary pulmonary nodule: Secondary | ICD-10-CM

## 2018-06-17 MED ORDER — IPRATROPIUM-ALBUTEROL 0.5-2.5 (3) MG/3ML IN SOLN: 3.0000 mL | Freq: Four times a day (QID) | RESPIRATORY_TRACT | 5 refills | Status: DC | PRN

## 2018-06-17 MED ORDER — PREDNISONE 10 MG PO TABS: ORAL_TABLET | ORAL | 0 refills | Status: DC

## 2018-06-17 MED ORDER — AZITHROMYCIN 250 MG PO TABS: ORAL_TABLET | ORAL | 0 refills | Status: DC

## 2018-06-17 NOTE — Patient Instructions (Addendum)
Continue the Trelegy inhaler We will start you on duo nebs.  He can use these up to 4 times a day as needed for shortness of breath We will give her a prednisone taper starting at 40 mg.  Reduce dose by 10 mg every 3 days Give you Z-Pak for COPD exacerbation  Continue to work on smoking cessation and stopping cocaine use Follow-up in 1 to 2 months with pulmonary function test.

## 2018-06-17 NOTE — Addendum Note (Signed)
Addended by: Maxwell MarionBLANKENSHIP, MARGIE A on: 06/17/2018 11:55 AM   Modules accepted: Orders

## 2018-06-17 NOTE — Progress Notes (Signed)
Vanessa FordVivian A Wallace    [id number]    1956-01-23  Primary Care Physician:System, Provider Not In  Referring Physician: Gilda Wallace, Richard M, MD [address] [place], [place] [id number]  Chief complaint:   Consult for COPD  HPI: 62 year old with history of COPD, asthma, cocaine user, CHF Referred to pulmonary for evaluation of COPD.  Admitted to hospital in September 2019 with COPD exacerbation in the setting of cocaine abuse.  She had a slow improvement with azithromycin, nebulizers, prednisone.  Noted to be in heart failure with volume overload.  She was diuresed during this admission. Continues to have dyspnea on exertion, cough with mucus production.  Denies fevers chills. She also has significant anxiety which is contributing to dyspnea.  Continues to smoke and use cocaine for back pain. Started on Trelegy by her primary care yesterday.  She also has nebulizer at home but does not recall the name of the medication.  Pets: No pets Occupation: Disabled Exposures: No known exposures Smoking history: 40+ smoking history.  Continues to smoke Travel history: No significant travel Relevant family history: No significant family history of lung disease.  Outpatient Encounter Medications as of 06/17/2018  Medication Sig  . albuterol (PROVENTIL HFA;VENTOLIN HFA) 108 (90 Base) MCG/ACT inhaler Inhale 1-2 puffs into the lungs every 6 (six) hours as needed for wheezing or shortness of breath.  Marland Kitchen. albuterol (PROVENTIL) (2.5 MG/3ML) 0.083% nebulizer solution Take 2.5 mg by nebulization every 6 (six) hours as needed for wheezing or shortness of breath.   Marland Kitchen. amLODipine (NORVASC) 5 MG tablet Take 1 tablet (5 mg total) by mouth daily.  Marland Kitchen. aspirin 81 MG tablet Take 81 mg by mouth daily.  . clonazePAM (KLONOPIN) 0.5 MG tablet Take 1 tablet by mouth 2 (two) times daily as needed for anxiety.  . DULoxetine (CYMBALTA) 60 MG capsule Take 60 mg by mouth daily.  . ferrous sulfate 325 (65 FE)  MG tablet Take 1 tablet (325 mg total) by mouth daily with breakfast.  . furosemide (LASIX) 20 MG tablet Take 1 tablet (20 mg total) by mouth daily.  Marland Kitchen. lisinopril (PRINIVIL,ZESTRIL) 20 MG tablet Take 1 tablet (20 mg total) by mouth daily.  Marland Kitchen. LORazepam (ATIVAN) 0.5 MG tablet Take 1 tablet (0.5 mg total) by mouth every 8 (eight) hours as needed for up to 30 doses for anxiety.  . montelukast (SINGULAIR) 10 MG tablet Take 10 mg by mouth at bedtime.  Marland Kitchen. omeprazole (PRILOSEC) 20 MG capsule Take 20 mg by mouth daily.  . [DISCONTINUED] budesonide-formoterol (SYMBICORT) 160-4.5 MCG/ACT inhaler Inhale 2 puffs into the lungs 2 (two) times daily.   No facility-administered encounter medications on file as of 06/17/2018.     Allergies as of 06/17/2018  . (No Known Allergies)    Past Medical History:  Diagnosis Date  . Anxiety   . Arthritis    "all over" (09/17/2013)  . Asthma   . CAP (community acquired pneumonia) 09/17/2013  . Chronic bronchitis (HCC)    "flares up w/my asthma" (09/17/2013)  . Cocaine abuse (HCC)   . COPD (chronic obstructive pulmonary disease) (HCC)   . Hypertension   . Migraines    "weekly lately" (09/17/2013)    Past Surgical History:  Procedure Laterality Date  . ABDOMINAL HYSTERECTOMY     "partial"  . BUNIONECTOMY Bilateral    "right one came back after OR" (09/17/2013)  . LACERATION REPAIR Left 06/1999   Repair FPL, radial digital nerve, intrinsics,  left thumb./notes 06/29/1999 (09/17/2013)  . TUBAL LIGATION      Family History  Problem Relation Age of Onset  . Brain cancer Mother     Social History   Socioeconomic History  . Marital status: Single    Spouse name: Not on file  . Number of children: Not on file  . Years of education: Not on file  . Highest education level: Not on file  Occupational History  . Not on file  Social Needs  . Financial resource strain: Not on file  . Food insecurity:    Worry: Not on file    Inability: Not on file  .  Transportation needs:    Medical: Not on file    Non-medical: Not on file  Tobacco Use  . Smoking status: Current Every Day Smoker    Packs/day: 0.30    Years: 40.00    Pack years: 12.00    Types: Cigarettes    Last attempt to quit: 09/18/2014    Years since quitting: 3.7  . Smokeless tobacco: Never Used  . Tobacco comment: 09/17/2013 "weaned down from 2 ppd of cigarettes"  Substance and Sexual Activity  . Alcohol use: No    Alcohol/week: 6.0 standard drinks    Types: 6 Cans of beer per week    Comment: Quit 2 weeks  . Drug use: Yes    Types: Marijuana, Cocaine    Comment: 09/17/2013 " Previous history of cocaine abuse; "quit over 1 yr ago"  . Sexual activity: Yes  Lifestyle  . Physical activity:    Days per week: Not on file    Minutes per session: Not on file  . Stress: Not on file  Relationships  . Social connections:    Talks on phone: Not on file    Gets together: Not on file    Attends religious service: Not on file    Active member of club or organization: Not on file    Attends meetings of clubs or organizations: Not on file    Relationship status: Not on file  . Intimate partner violence:    Fear of current or ex partner: Not on file    Emotionally abused: Not on file    Physically abused: Not on file    Forced sexual activity: Not on file  Other Topics Concern  . Not on file  Social History Narrative  . Not on file    Review of systems: Review of Systems  Constitutional: Negative for fever and chills.  HENT: Negative.   Eyes: Negative for blurred vision.  Respiratory: as per HPI  Cardiovascular: Negative for chest pain and palpitations.  Gastrointestinal: Negative for vomiting, diarrhea, blood per rectum. Genitourinary: Negative for dysuria, urgency, frequency and hematuria.  Musculoskeletal: Negative for myalgias, back pain and joint pain.  Skin: Negative for itching and rash.  Neurological: Negative for dizziness, tremors, focal weakness, seizures and  loss of consciousness.  Endo/Heme/Allergies: Negative for environmental allergies.  Psychiatric/Behavioral: Negative for depression, suicidal ideas and hallucinations.  All other systems reviewed and are negative.  Physical Exam: Blood pressure (!) 144/72, pulse (!) 103, height 5\' 2"  (1.575 m), weight 104 lb (47.2 kg), SpO2 90 %. Gen:      Mild distress, frail, anxious, malnourished HEENT:  EOMI, sclera anicteric Neck:     No masses; no thyromegaly Lungs:    Scattered expiratory wheeze. CV:         Regular rate and rhythm; no murmurs Abd:      +  bowel sounds; soft, non-tender; no palpable masses, no distension Ext:    No edema; adequate peripheral perfusion Skin:      Warm and dry; no rash Neuro: alert and oriented x 3  Data Reviewed: Imaging: CT chest 03/25/2018- no PE, soft tissue density at the apices.  Advanced emphysema, cardiomegaly with coronary artery calcification.  T8 compression fracture.  Prominent bilateral hilar lymph nodes. I reviewed the images personally.  PFTs: Pending  Labs: CBC 03/25/2018-WBC 5.8, eos 1%, absolute eosinophil count 58  Cardiac: Mild LVH, LVEF 50-55%, PA peak pressure of 67, grade 1 diastolic dysfunction.  Assessment:  Severe emphysema, No PFTs on record Appears to be working to breathe today although I cannot tell if it is pure anxiety or COPD exacerbation Since she has scattered expiratory wheeze we will treat her with prednisone taper and azithromycin She has been recently started on Trelegy and will continue the same Prescribe duo nebs as needed for home Continue supplemental oxygen.  Pulmonary function test on return visit.  Active tobacco smoker, cocaine abuser Discussed smoking cessation and emphasized that she needs to quit  Abnormal CT Prior CT this year reviewed with biapical soft tissue densities.  Ideally we would like to get a PET scan for further evaluation but I do not believe she will be able to tolerate it given her severe  anxiety. Repeat CT chest without contrast  Goals of care At last hospitalization CODE STATUS was DNR.  Palliative care was recommended but patient did not want to meet them.  Continue discussions going forward.  Plan/Recommendations: - Continue Trelegy, start duo nebs - Prednisone taper, Z-Pak - Smoking, cocaine cessation - PFTs - CT chest without contrast  Chilton Greathouse MD [facility] 06/17/2018, 11:16 AM  CC: Pavelock, Duke Salvia, MD

## 2018-06-20 ENCOUNTER — Inpatient Hospital Stay: Admission: RE | Admit: 2018-06-20 | Payer: Medicaid Other | Source: Ambulatory Visit

## 2018-06-21 ENCOUNTER — Encounter (HOSPITAL_COMMUNITY): Payer: Self-pay | Admitting: Emergency Medicine

## 2018-06-21 ENCOUNTER — Emergency Department (HOSPITAL_COMMUNITY): Payer: Medicaid Other

## 2018-06-21 ENCOUNTER — Inpatient Hospital Stay (HOSPITAL_COMMUNITY)
Admission: EM | Admit: 2018-06-21 | Discharge: 2018-07-05 | DRG: 208 | Disposition: A | Discharge status: Home Health | Payer: Medicaid Other | Attending: Internal Medicine | Admitting: Internal Medicine

## 2018-06-21 DIAGNOSIS — Z789 Other specified health status: Secondary | ICD-10-CM

## 2018-06-21 DIAGNOSIS — F419 Anxiety disorder, unspecified: Secondary | ICD-10-CM | POA: Diagnosis present

## 2018-06-21 DIAGNOSIS — J9602 Acute respiratory failure with hypercapnia: Secondary | ICD-10-CM

## 2018-06-21 DIAGNOSIS — R1310 Dysphagia, unspecified: Secondary | ICD-10-CM

## 2018-06-21 DIAGNOSIS — F418 Other specified anxiety disorders: Secondary | ICD-10-CM | POA: Diagnosis present

## 2018-06-21 DIAGNOSIS — E43 Unspecified severe protein-calorie malnutrition: Secondary | ICD-10-CM | POA: Diagnosis present

## 2018-06-21 DIAGNOSIS — F1290 Cannabis use, unspecified, uncomplicated: Secondary | ICD-10-CM | POA: Diagnosis present

## 2018-06-21 DIAGNOSIS — Z7951 Long term (current) use of inhaled steroids: Secondary | ICD-10-CM

## 2018-06-21 DIAGNOSIS — Z90710 Acquired absence of both cervix and uterus: Secondary | ICD-10-CM

## 2018-06-21 DIAGNOSIS — E875 Hyperkalemia: Secondary | ICD-10-CM | POA: Diagnosis present

## 2018-06-21 DIAGNOSIS — R740 Nonspecific elevation of levels of transaminase and lactic acid dehydrogenase [LDH]: Secondary | ICD-10-CM | POA: Diagnosis present

## 2018-06-21 DIAGNOSIS — F1320 Sedative, hypnotic or anxiolytic dependence, uncomplicated: Secondary | ICD-10-CM | POA: Diagnosis present

## 2018-06-21 DIAGNOSIS — Z22322 Carrier or suspected carrier of Methicillin resistant Staphylococcus aureus: Secondary | ICD-10-CM

## 2018-06-21 DIAGNOSIS — R64 Cachexia: Secondary | ICD-10-CM | POA: Diagnosis present

## 2018-06-21 DIAGNOSIS — R34 Anuria and oliguria: Secondary | ICD-10-CM | POA: Diagnosis present

## 2018-06-21 DIAGNOSIS — J9621 Acute and chronic respiratory failure with hypoxia: Secondary | ICD-10-CM | POA: Diagnosis present

## 2018-06-21 DIAGNOSIS — R001 Bradycardia, unspecified: Secondary | ICD-10-CM | POA: Diagnosis not present

## 2018-06-21 DIAGNOSIS — Z79899 Other long term (current) drug therapy: Secondary | ICD-10-CM

## 2018-06-21 DIAGNOSIS — R7401 Elevation of levels of liver transaminase levels: Secondary | ICD-10-CM | POA: Diagnosis present

## 2018-06-21 DIAGNOSIS — J9690 Respiratory failure, unspecified, unspecified whether with hypoxia or hypercapnia: Secondary | ICD-10-CM

## 2018-06-21 DIAGNOSIS — Z9981 Dependence on supplemental oxygen: Secondary | ICD-10-CM

## 2018-06-21 DIAGNOSIS — Z781 Physical restraint status: Secondary | ICD-10-CM

## 2018-06-21 DIAGNOSIS — Z7982 Long term (current) use of aspirin: Secondary | ICD-10-CM

## 2018-06-21 DIAGNOSIS — F1410 Cocaine abuse, uncomplicated: Secondary | ICD-10-CM | POA: Diagnosis present

## 2018-06-21 DIAGNOSIS — J9622 Acute and chronic respiratory failure with hypercapnia: Secondary | ICD-10-CM | POA: Diagnosis present

## 2018-06-21 DIAGNOSIS — Z978 Presence of other specified devices: Secondary | ICD-10-CM

## 2018-06-21 DIAGNOSIS — G9341 Metabolic encephalopathy: Secondary | ICD-10-CM | POA: Diagnosis present

## 2018-06-21 DIAGNOSIS — I5032 Chronic diastolic (congestive) heart failure: Secondary | ICD-10-CM | POA: Diagnosis present

## 2018-06-21 DIAGNOSIS — Z4659 Encounter for fitting and adjustment of other gastrointestinal appliance and device: Secondary | ICD-10-CM

## 2018-06-21 DIAGNOSIS — E872 Acidosis: Secondary | ICD-10-CM | POA: Diagnosis present

## 2018-06-21 DIAGNOSIS — I952 Hypotension due to drugs: Secondary | ICD-10-CM | POA: Diagnosis not present

## 2018-06-21 DIAGNOSIS — F17210 Nicotine dependence, cigarettes, uncomplicated: Secondary | ICD-10-CM | POA: Diagnosis present

## 2018-06-21 DIAGNOSIS — Z515 Encounter for palliative care: Secondary | ICD-10-CM

## 2018-06-21 DIAGNOSIS — Z66 Do not resuscitate: Secondary | ICD-10-CM | POA: Diagnosis present

## 2018-06-21 DIAGNOSIS — I2721 Secondary pulmonary arterial hypertension: Secondary | ICD-10-CM | POA: Diagnosis present

## 2018-06-21 DIAGNOSIS — I110 Hypertensive heart disease with heart failure: Secondary | ICD-10-CM | POA: Diagnosis present

## 2018-06-21 DIAGNOSIS — R451 Restlessness and agitation: Secondary | ICD-10-CM | POA: Diagnosis present

## 2018-06-21 DIAGNOSIS — F1910 Other psychoactive substance abuse, uncomplicated: Secondary | ICD-10-CM

## 2018-06-21 DIAGNOSIS — Z681 Body mass index (BMI) 19 or less, adult: Secondary | ICD-10-CM

## 2018-06-21 DIAGNOSIS — J441 Chronic obstructive pulmonary disease with (acute) exacerbation: Principal | ICD-10-CM | POA: Diagnosis present

## 2018-06-21 DIAGNOSIS — T4275XA Adverse effect of unspecified antiepileptic and sedative-hypnotic drugs, initial encounter: Secondary | ICD-10-CM | POA: Diagnosis not present

## 2018-06-21 DIAGNOSIS — R0902 Hypoxemia: Secondary | ICD-10-CM

## 2018-06-21 MED ORDER — ALBUTEROL (5 MG/ML) CONTINUOUS INHALATION SOLN
15.0000 mg | INHALATION_SOLUTION | Freq: Once | RESPIRATORY_TRACT | Status: AC
  Administered 2018-06-22: 15 mg via RESPIRATORY_TRACT
  Filled 2018-06-21: qty 20

## 2018-06-21 NOTE — ED Triage Notes (Signed)
Pt transported from home by EMS with increasing shob, hx of chf, copd, unresolved by home MDI.  Per EMS pt sat 81% on 3L home 02. IV est Solumedrol 125mg , Mg 2gm and duoneb given. Pt arrived with bipap in place. +2 pitting edema in lower extremities.

## 2018-06-22 ENCOUNTER — Inpatient Hospital Stay (HOSPITAL_COMMUNITY): Payer: Medicaid Other

## 2018-06-22 ENCOUNTER — Other Ambulatory Visit: Payer: Self-pay

## 2018-06-22 ENCOUNTER — Encounter (HOSPITAL_COMMUNITY): Payer: Self-pay | Admitting: General Practice

## 2018-06-22 DIAGNOSIS — Z681 Body mass index (BMI) 19 or less, adult: Secondary | ICD-10-CM | POA: Diagnosis not present

## 2018-06-22 DIAGNOSIS — F1320 Sedative, hypnotic or anxiolytic dependence, uncomplicated: Secondary | ICD-10-CM | POA: Diagnosis present

## 2018-06-22 DIAGNOSIS — F419 Anxiety disorder, unspecified: Secondary | ICD-10-CM | POA: Diagnosis not present

## 2018-06-22 DIAGNOSIS — E43 Unspecified severe protein-calorie malnutrition: Secondary | ICD-10-CM | POA: Diagnosis present

## 2018-06-22 DIAGNOSIS — I2721 Secondary pulmonary arterial hypertension: Secondary | ICD-10-CM | POA: Diagnosis present

## 2018-06-22 DIAGNOSIS — E875 Hyperkalemia: Secondary | ICD-10-CM | POA: Diagnosis present

## 2018-06-22 DIAGNOSIS — J441 Chronic obstructive pulmonary disease with (acute) exacerbation: Secondary | ICD-10-CM | POA: Diagnosis present

## 2018-06-22 DIAGNOSIS — F418 Other specified anxiety disorders: Secondary | ICD-10-CM | POA: Diagnosis present

## 2018-06-22 DIAGNOSIS — R7401 Elevation of levels of liver transaminase levels: Secondary | ICD-10-CM | POA: Diagnosis present

## 2018-06-22 DIAGNOSIS — F1410 Cocaine abuse, uncomplicated: Secondary | ICD-10-CM | POA: Diagnosis present

## 2018-06-22 DIAGNOSIS — E872 Acidosis: Secondary | ICD-10-CM | POA: Diagnosis present

## 2018-06-22 DIAGNOSIS — F1290 Cannabis use, unspecified, uncomplicated: Secondary | ICD-10-CM | POA: Diagnosis present

## 2018-06-22 DIAGNOSIS — R34 Anuria and oliguria: Secondary | ICD-10-CM | POA: Diagnosis present

## 2018-06-22 DIAGNOSIS — J9621 Acute and chronic respiratory failure with hypoxia: Secondary | ICD-10-CM | POA: Diagnosis present

## 2018-06-22 DIAGNOSIS — I10 Essential (primary) hypertension: Secondary | ICD-10-CM | POA: Insufficient documentation

## 2018-06-22 DIAGNOSIS — R451 Restlessness and agitation: Secondary | ICD-10-CM | POA: Diagnosis present

## 2018-06-22 DIAGNOSIS — Z515 Encounter for palliative care: Secondary | ICD-10-CM | POA: Diagnosis not present

## 2018-06-22 DIAGNOSIS — J9602 Acute respiratory failure with hypercapnia: Secondary | ICD-10-CM | POA: Diagnosis not present

## 2018-06-22 DIAGNOSIS — R740 Nonspecific elevation of levels of transaminase and lactic acid dehydrogenase [LDH]: Secondary | ICD-10-CM

## 2018-06-22 DIAGNOSIS — F1910 Other psychoactive substance abuse, uncomplicated: Secondary | ICD-10-CM | POA: Diagnosis not present

## 2018-06-22 DIAGNOSIS — I5032 Chronic diastolic (congestive) heart failure: Secondary | ICD-10-CM | POA: Diagnosis present

## 2018-06-22 DIAGNOSIS — Z66 Do not resuscitate: Secondary | ICD-10-CM | POA: Diagnosis present

## 2018-06-22 DIAGNOSIS — R64 Cachexia: Secondary | ICD-10-CM | POA: Diagnosis present

## 2018-06-22 DIAGNOSIS — G9341 Metabolic encephalopathy: Secondary | ICD-10-CM | POA: Diagnosis present

## 2018-06-22 DIAGNOSIS — I952 Hypotension due to drugs: Secondary | ICD-10-CM | POA: Diagnosis not present

## 2018-06-22 DIAGNOSIS — J9622 Acute and chronic respiratory failure with hypercapnia: Secondary | ICD-10-CM | POA: Diagnosis present

## 2018-06-22 DIAGNOSIS — F17210 Nicotine dependence, cigarettes, uncomplicated: Secondary | ICD-10-CM | POA: Diagnosis present

## 2018-06-22 DIAGNOSIS — I110 Hypertensive heart disease with heart failure: Secondary | ICD-10-CM | POA: Diagnosis present

## 2018-06-22 DIAGNOSIS — R001 Bradycardia, unspecified: Secondary | ICD-10-CM | POA: Diagnosis not present

## 2018-06-22 LAB — BASIC METABOLIC PANEL
Anion gap: 11 (ref 5–15)
BUN: 19 mg/dL (ref 6–20)
CALCIUM: 8.9 mg/dL (ref 8.9–10.3)
CO2: 32 mmol/L (ref 22–32)
Chloride: 99 mmol/L (ref 98–111)
Creatinine, Ser: 0.72 mg/dL (ref 0.44–1.00)
GFR calc Af Amer: >60 mL/min (ref >60)
GFR calc non Af Amer: >60 mL/min (ref >60)
Glucose, Bld: 105 mg/dL — ABNORMAL HIGH (ref 70–99)
Potassium: 4.2 mmol/L (ref 3.5–5.1)
Sodium: 142 mmol/L (ref 135–145)

## 2018-06-22 LAB — COMPREHENSIVE METABOLIC PANEL
ALBUMIN: 3.7 g/dL (ref 3.5–5.0)
ALT: 46 U/L — ABNORMAL HIGH (ref 0–44)
AST: 62 U/L — AB (ref 15–41)
Alkaline Phosphatase: 66 U/L (ref 38–126)
Anion gap: 12 (ref 5–15)
BUN: 17 mg/dL (ref 6–20)
CHLORIDE: 99 mmol/L (ref 98–111)
CO2: 32 mmol/L (ref 22–32)
Calcium: 9.3 mg/dL (ref 8.9–10.3)
Creatinine, Ser: 0.67 mg/dL (ref 0.44–1.00)
GFR calc Af Amer: >60 mL/min (ref >60)
GFR calc non Af Amer: >60 mL/min (ref >60)
GLUCOSE: 94 mg/dL (ref 70–99)
POTASSIUM: 4.2 mmol/L (ref 3.5–5.1)
SODIUM: 143 mmol/L (ref 135–145)
Total Bilirubin: 0.7 mg/dL (ref 0.3–1.2)
Total Protein: 8.1 g/dL (ref 6.5–8.1)

## 2018-06-22 LAB — CBC WITH DIFFERENTIAL/PLATELET
Abs Immature Granulocytes: 0.02 10*3/uL (ref 0.00–0.07)
Abs Immature Granulocytes: 0.03 10*3/uL (ref 0.00–0.07)
BASOS ABS: 0 10*3/uL (ref 0.0–0.1)
BASOS PCT: 0 %
Basophils Absolute: 0 10*3/uL (ref 0.0–0.1)
Basophils Relative: 0 %
EOS ABS: 0 10*3/uL (ref 0.0–0.5)
EOS ABS: 0 10*3/uL (ref 0.0–0.5)
Eosinophils Relative: 0 %
Eosinophils Relative: 0 %
HCT: 39 % (ref 36.0–46.0)
HCT: 43.7 % (ref 36.0–46.0)
Hemoglobin: 11.3 g/dL — ABNORMAL LOW (ref 12.0–15.0)
Hemoglobin: 12.3 g/dL (ref 12.0–15.0)
IMMATURE GRANULOCYTES: 0 %
Immature Granulocytes: 0 %
LYMPHS PCT: 12 %
Lymphocytes Relative: 5 %
Lymphs Abs: 0.3 10*3/uL — ABNORMAL LOW (ref 0.7–4.0)
Lymphs Abs: 0.8 10*3/uL (ref 0.7–4.0)
MCH: 26.3 pg (ref 26.0–34.0)
MCH: 26.5 pg (ref 26.0–34.0)
MCHC: 28.1 g/dL — ABNORMAL LOW (ref 30.0–36.0)
MCHC: 29 g/dL — ABNORMAL LOW (ref 30.0–36.0)
MCV: 91.5 fL (ref 80.0–100.0)
MCV: 93.6 fL (ref 80.0–100.0)
Monocytes Absolute: 0.1 10*3/uL (ref 0.1–1.0)
Monocytes Absolute: 0.6 10*3/uL (ref 0.1–1.0)
Monocytes Relative: 2 %
Monocytes Relative: 9 %
NEUTROS PCT: 79 %
NEUTROS PCT: 93 %
NRBC: 0 % (ref 0.0–0.2)
Neutro Abs: 5.5 10*3/uL (ref 1.7–7.7)
Neutro Abs: 6.4 10*3/uL (ref 1.7–7.7)
PLATELETS: 216 10*3/uL (ref 150–400)
Platelets: 229 10*3/uL (ref 150–400)
RBC: 4.26 MIL/uL (ref 3.87–5.11)
RBC: 4.67 MIL/uL (ref 3.87–5.11)
RDW: 16.9 % — ABNORMAL HIGH (ref 11.5–15.5)
RDW: 17.1 % — AB (ref 11.5–15.5)
WBC: 6.9 10*3/uL (ref 4.0–10.5)
WBC: 7 10*3/uL (ref 4.0–10.5)
nRBC: 0 % (ref 0.0–0.2)

## 2018-06-22 LAB — GLUCOSE, CAPILLARY
GLUCOSE-CAPILLARY: 113 mg/dL — AB (ref 70–99)
Glucose-Capillary: 100 mg/dL — ABNORMAL HIGH (ref 70–99)
Glucose-Capillary: 127 mg/dL — ABNORMAL HIGH (ref 70–99)
Glucose-Capillary: 85 mg/dL (ref 70–99)

## 2018-06-22 LAB — I-STAT ARTERIAL BLOOD GAS, ED
ACID-BASE EXCESS: 6 mmol/L — AB (ref 0.0–2.0)
Bicarbonate: 35.2 mmol/L — ABNORMAL HIGH (ref 20.0–28.0)
O2 Saturation: 98 %
PCO2 ART: 69.7 mmHg — AB (ref 32.0–48.0)
PH ART: 7.308 — AB (ref 7.350–7.450)
PO2 ART: 123 mmHg — AB (ref 83.0–108.0)
Patient temperature: 97.8
TCO2: 37 mmol/L — AB (ref 22–32)

## 2018-06-22 LAB — BRAIN NATRIURETIC PEPTIDE: B Natriuretic Peptide: 205.6 pg/mL — ABNORMAL HIGH (ref 0.0–100.0)

## 2018-06-22 LAB — POCT I-STAT 3, ART BLOOD GAS (G3+)
Acid-Base Excess: 8 mmol/L — ABNORMAL HIGH (ref 0.0–2.0)
Bicarbonate: 35.6 mmol/L — ABNORMAL HIGH (ref 20.0–28.0)
O2 Saturation: 95 %
TCO2: 38 mmol/L — ABNORMAL HIGH (ref 22–32)
pCO2 arterial: 66.3 mmHg (ref 32.0–48.0)
pH, Arterial: 7.338 — ABNORMAL LOW (ref 7.350–7.450)
pO2, Arterial: 83 mmHg (ref 83.0–108.0)

## 2018-06-22 LAB — BLOOD GAS, ARTERIAL
Acid-Base Excess: 8.5 mmol/L — ABNORMAL HIGH (ref 0.0–2.0)
Bicarbonate: 35.4 mmol/L — ABNORMAL HIGH (ref 20.0–28.0)
Delivery systems: POSITIVE
Drawn by: 246861
Expiratory PAP: 5
FIO2: 50
Inspiratory PAP: 14
Mode: POSITIVE
O2 Saturation: 97 %
PH ART: 7.262 — AB (ref 7.350–7.450)
Patient temperature: 98.6
pCO2 arterial: 81.3 mmHg (ref 32.0–48.0)
pO2, Arterial: 103 mmHg (ref 83.0–108.0)

## 2018-06-22 LAB — TRIGLYCERIDES: TRIGLYCERIDES: 35 mg/dL (ref <150)

## 2018-06-22 LAB — I-STAT TROPONIN, ED: Troponin i, poc: 0.02 ng/mL (ref 0.00–0.08)

## 2018-06-22 LAB — MRSA PCR SCREENING: MRSA by PCR: POSITIVE — AB

## 2018-06-22 MED ORDER — LEVOFLOXACIN IN D5W 500 MG/100ML IV SOLN
500.0000 mg | Freq: Once | INTRAVENOUS | Status: AC
  Administered 2018-06-22: 500 mg via INTRAVENOUS
  Filled 2018-06-22: qty 100

## 2018-06-22 MED ORDER — UMECLIDINIUM BROMIDE 62.5 MCG/INH IN AEPB
1.0000 | INHALATION_SPRAY | Freq: Every day | RESPIRATORY_TRACT | Status: DC
  Filled 2018-06-22: qty 7

## 2018-06-22 MED ORDER — ORAL CARE MOUTH RINSE
15.0000 mL | OROMUCOSAL | Status: DC
  Administered 2018-06-22 – 2018-06-26 (×27): 15 mL via OROMUCOSAL

## 2018-06-22 MED ORDER — FLUTICASONE-UMECLIDIN-VILANT 100-62.5-25 MCG/INH IN AEPB: 1.0000 | INHALATION_SPRAY | Freq: Every day | RESPIRATORY_TRACT | Status: DC

## 2018-06-22 MED ORDER — DULOXETINE HCL 60 MG PO CPEP
60.0000 mg | ORAL_CAPSULE | Freq: Every day | ORAL | Status: DC
  Administered 2018-06-24: 60 mg via ORAL
  Filled 2018-06-22 (×3): qty 1

## 2018-06-22 MED ORDER — NICOTINE 21 MG/24HR TD PT24
21.0000 mg | MEDICATED_PATCH | Freq: Every day | TRANSDERMAL | Status: DC
  Administered 2018-06-22 – 2018-06-24 (×3): 21 mg via TRANSDERMAL
  Filled 2018-06-22 (×3): qty 1

## 2018-06-22 MED ORDER — PANTOPRAZOLE SODIUM 40 MG PO TBEC: 40.0000 mg | DELAYED_RELEASE_TABLET | Freq: Every day | ORAL | Status: DC

## 2018-06-22 MED ORDER — CLONAZEPAM 0.5 MG PO TABS: 0.5000 mg | ORAL_TABLET | Freq: Two times a day (BID) | ORAL | Status: DC | PRN

## 2018-06-22 MED ORDER — SODIUM CHLORIDE 0.9% FLUSH
3.0000 mL | Freq: Two times a day (BID) | INTRAVENOUS | Status: DC
  Administered 2018-06-22 – 2018-07-04 (×11): 3 mL via INTRAVENOUS

## 2018-06-22 MED ORDER — ROCURONIUM BROMIDE 50 MG/5ML IV SOLN
1.0000 mg/kg | Freq: Once | INTRAVENOUS | Status: AC
  Administered 2018-06-22: 42.6 mg via INTRAVENOUS

## 2018-06-22 MED ORDER — MUPIROCIN 2 % EX OINT: 1.0000 "application " | TOPICAL_OINTMENT | Freq: Two times a day (BID) | CUTANEOUS | Status: DC

## 2018-06-22 MED ORDER — NOREPINEPHRINE 4 MG/250ML-% IV SOLN
INTRAVENOUS | Status: AC
  Administered 2018-06-22: 2 ug/min via INTRAVENOUS
  Filled 2018-06-22: qty 250

## 2018-06-22 MED ORDER — FENTANYL CITRATE (PF) 100 MCG/2ML IJ SOLN
100.0000 ug | Freq: Once | INTRAMUSCULAR | Status: AC
  Administered 2018-06-22: 100 ug via INTRAVENOUS

## 2018-06-22 MED ORDER — LORAZEPAM 2 MG/ML IJ SOLN
1.0000 mg | Freq: Once | INTRAMUSCULAR | Status: AC
  Administered 2018-06-22: 1 mg via INTRAVENOUS
  Filled 2018-06-22: qty 1

## 2018-06-22 MED ORDER — ETOMIDATE 2 MG/ML IV SOLN
20.0000 mg | Freq: Once | INTRAVENOUS | Status: AC
  Administered 2018-06-22: 20 mg via INTRAVENOUS

## 2018-06-22 MED ORDER — AMLODIPINE BESYLATE 5 MG PO TABS: 5.0000 mg | ORAL_TABLET | Freq: Every day | ORAL | Status: DC

## 2018-06-22 MED ORDER — LORAZEPAM 2 MG/ML IJ SOLN
0.5000 mg | Freq: Once | INTRAMUSCULAR | Status: AC
  Administered 2018-06-22: 0.5 mg via INTRAVENOUS
  Filled 2018-06-22: qty 1

## 2018-06-22 MED ORDER — FENTANYL CITRATE (PF) 100 MCG/2ML IJ SOLN
50.0000 ug | INTRAMUSCULAR | Status: DC | PRN
  Administered 2018-06-22 – 2018-06-24 (×4): 50 ug via INTRAVENOUS
  Filled 2018-06-22 (×6): qty 2

## 2018-06-22 MED ORDER — SODIUM CHLORIDE 0.9 % IV SOLN: 250.0000 mL | INTRAVENOUS | Status: DC | PRN

## 2018-06-22 MED ORDER — ONDANSETRON HCL 4 MG/2ML IJ SOLN: 4.0000 mg | Freq: Four times a day (QID) | INTRAMUSCULAR | Status: DC | PRN

## 2018-06-22 MED ORDER — PROPOFOL 1000 MG/100ML IV EMUL
INTRAVENOUS | Status: AC
  Administered 2018-06-22: 10 ug/kg/min via INTRAVENOUS
  Filled 2018-06-22: qty 100

## 2018-06-22 MED ORDER — MIDAZOLAM HCL 2 MG/2ML IJ SOLN
1.0000 mg | Freq: Once | INTRAMUSCULAR | Status: AC
  Administered 2018-06-22: 1 mg via INTRAVENOUS

## 2018-06-22 MED ORDER — SODIUM CHLORIDE 0.9 % IV BOLUS
1000.0000 mL | Freq: Once | INTRAVENOUS | Status: AC
  Administered 2018-06-22: 1000 mL via INTRAVENOUS

## 2018-06-22 MED ORDER — IPRATROPIUM-ALBUTEROL 0.5-2.5 (3) MG/3ML IN SOLN
3.0000 mL | Freq: Three times a day (TID) | RESPIRATORY_TRACT | Status: DC
  Administered 2018-06-22 – 2018-06-30 (×24): 3 mL via RESPIRATORY_TRACT
  Filled 2018-06-22 (×24): qty 3

## 2018-06-22 MED ORDER — ALBUTEROL SULFATE (2.5 MG/3ML) 0.083% IN NEBU: 2.5000 mg | INHALATION_SOLUTION | RESPIRATORY_TRACT | Status: DC | PRN

## 2018-06-22 MED ORDER — LISINOPRIL 20 MG PO TABS: 20.0000 mg | ORAL_TABLET | Freq: Every day | ORAL | Status: DC

## 2018-06-22 MED ORDER — FLUTICASONE FUROATE-VILANTEROL 100-25 MCG/INH IN AEPB
1.0000 | INHALATION_SPRAY | Freq: Every day | RESPIRATORY_TRACT | Status: DC
  Filled 2018-06-22: qty 28

## 2018-06-22 MED ORDER — ENOXAPARIN SODIUM 40 MG/0.4ML ~~LOC~~ SOLN
40.0000 mg | SUBCUTANEOUS | Status: DC
  Administered 2018-06-22 – 2018-06-23 (×2): 40 mg via SUBCUTANEOUS
  Filled 2018-06-22 (×2): qty 0.4

## 2018-06-22 MED ORDER — MIDAZOLAM HCL 2 MG/2ML IJ SOLN
INTRAMUSCULAR | Status: AC
  Administered 2018-06-22: 1 mg via INTRAVENOUS
  Filled 2018-06-22: qty 2

## 2018-06-22 MED ORDER — NOREPINEPHRINE 4 MG/250ML-% IV SOLN
0.0000 ug/min | INTRAVENOUS | Status: DC
  Administered 2018-06-22: 2 ug/min via INTRAVENOUS
  Administered 2018-06-23: 9 ug/min via INTRAVENOUS
  Filled 2018-06-22: qty 250

## 2018-06-22 MED ORDER — CHLORHEXIDINE GLUCONATE CLOTH 2 % EX PADS: 6.0000 | MEDICATED_PAD | Freq: Every day | CUTANEOUS | Status: DC

## 2018-06-22 MED ORDER — ACETAMINOPHEN 325 MG PO TABS: 650.0000 mg | ORAL_TABLET | Freq: Four times a day (QID) | ORAL | Status: DC | PRN

## 2018-06-22 MED ORDER — METHYLPREDNISOLONE SODIUM SUCC 125 MG IJ SOLR
60.0000 mg | Freq: Four times a day (QID) | INTRAMUSCULAR | Status: DC
  Administered 2018-06-22 – 2018-06-24 (×10): 60 mg via INTRAVENOUS
  Filled 2018-06-22 (×10): qty 2

## 2018-06-22 MED ORDER — ETOMIDATE 2 MG/ML IV SOLN: 0.3000 mg/kg | Freq: Once | INTRAVENOUS | Status: AC

## 2018-06-22 MED ORDER — ONDANSETRON HCL 4 MG PO TABS
4.0000 mg | ORAL_TABLET | Freq: Four times a day (QID) | ORAL | Status: DC | PRN
  Administered 2018-07-03: 4 mg via ORAL
  Filled 2018-06-22 (×2): qty 1

## 2018-06-22 MED ORDER — PROPOFOL 1000 MG/100ML IV EMUL
5.0000 ug/kg/min | INTRAVENOUS | Status: DC
  Administered 2018-06-22: 10 ug/kg/min via INTRAVENOUS
  Administered 2018-06-22 – 2018-06-23 (×3): 50 ug/kg/min via INTRAVENOUS
  Administered 2018-06-23: 60 ug/kg/min via INTRAVENOUS
  Administered 2018-06-24: 40 ug/kg/min via INTRAVENOUS
  Filled 2018-06-22 (×7): qty 100

## 2018-06-22 MED ORDER — ASPIRIN EC 81 MG PO TBEC
81.0000 mg | DELAYED_RELEASE_TABLET | Freq: Every day | ORAL | Status: DC
  Filled 2018-06-22: qty 1

## 2018-06-22 MED ORDER — SODIUM CHLORIDE 0.9% FLUSH: 3.0000 mL | INTRAVENOUS | Status: DC | PRN

## 2018-06-22 MED ORDER — SODIUM CHLORIDE 0.9% FLUSH
3.0000 mL | Freq: Two times a day (BID) | INTRAVENOUS | Status: DC
  Administered 2018-06-22 – 2018-07-04 (×17): 3 mL via INTRAVENOUS

## 2018-06-22 MED ORDER — ACETAMINOPHEN 650 MG RE SUPP: 650.0000 mg | Freq: Four times a day (QID) | RECTAL | Status: DC | PRN

## 2018-06-22 MED ORDER — FENTANYL CITRATE (PF) 100 MCG/2ML IJ SOLN
INTRAMUSCULAR | Status: AC
  Administered 2018-06-22: 100 ug via INTRAVENOUS
  Filled 2018-06-22: qty 2

## 2018-06-22 MED ORDER — FUROSEMIDE 10 MG/ML IJ SOLN
20.0000 mg | Freq: Once | INTRAMUSCULAR | Status: AC
  Administered 2018-06-22: 20 mg via INTRAVENOUS
  Filled 2018-06-22: qty 2

## 2018-06-22 MED ORDER — CHLORHEXIDINE GLUCONATE 0.12% ORAL RINSE (MEDLINE KIT)
15.0000 mL | Freq: Two times a day (BID) | OROMUCOSAL | Status: DC
  Administered 2018-06-22 – 2018-06-25 (×7): 15 mL via OROMUCOSAL

## 2018-06-22 MED ORDER — LEVOFLOXACIN IN D5W 750 MG/150ML IV SOLN
750.0000 mg | INTRAVENOUS | Status: DC
  Administered 2018-06-23: 750 mg via INTRAVENOUS
  Filled 2018-06-22: qty 150

## 2018-06-22 MED ORDER — MONTELUKAST SODIUM 10 MG PO TABS: 10.0000 mg | ORAL_TABLET | Freq: Every day | ORAL | Status: DC

## 2018-06-22 MED ORDER — PANTOPRAZOLE SODIUM 40 MG PO PACK
40.0000 mg | PACK | Freq: Every day | ORAL | Status: DC
  Administered 2018-06-23 – 2018-06-24 (×2): 40 mg
  Filled 2018-06-22 (×2): qty 20

## 2018-06-22 MED ORDER — FUROSEMIDE 20 MG PO TABS: 20.0000 mg | ORAL_TABLET | Freq: Every day | ORAL | Status: DC

## 2018-06-22 NOTE — ED Provider Notes (Signed)
[facility] EMERGENCY DEPARTMENT Provider Note   CSN: [id number] Arrival date & time: 06/21/18  2319     History   Chief Complaint Chief Complaint  Patient presents with  . URI    HPI Vanessa Wallace is a 62 y.o. female.  HPI  This is a 62 year old female with history of COPD, CHF, COPD, hypertension who presents in respiratory distress.  Patient is difficult to obtain a history from.  She was seen in the pulmonary office on December 3.  At that time she was treated for COPD exacerbation with prednisone taper and Z-Pak.  She continues to smoke and use cocaine.  Worsening shortness of breath and tachypnea per friend over the last 24 hours.  No fevers.  She has had a cough.  She denies any chest pain.  She was placed on BiPAP by EMS.  She was given mag, Solu-Medrol, and a DuoNeb.  Level 5 caveat for acuity of condition  Past Medical History:  Diagnosis Date  . Anxiety   . Arthritis    "all over" (09/17/2013)  . Asthma   . CAP (community acquired pneumonia) 09/17/2013  . Chronic bronchitis (HCC)    "flares up w/my asthma" (09/17/2013)  . Cocaine abuse (HCC)   . COPD (chronic obstructive pulmonary disease) (HCC)   . Hypertension   . Migraines    "weekly lately" (09/17/2013)    Patient Active Problem List   Diagnosis Date Noted  . Fluid overload 03/25/2018  . Protein-calorie malnutrition, severe 08/12/2017  . Protein-calorie malnutrition, moderate (HCC) 08/08/2017  . Iron deficiency anemia 08/08/2017  . Depression 08/08/2017  . Acute on chronic respiratory failure with hypoxia (HCC) 08/08/2017  . COPD exacerbation (HCC) 08/30/2016  . COPD (chronic obstructive pulmonary disease) (HCC) 08/30/2016  . Asthma exacerbation 10/02/2014  . Renal insufficiency   . Chest wall pain   . Cough   . Cocaine use 09/22/2013  . Pneumonia 09/17/2013  . Community acquired pneumonia 09/17/2013    Past Surgical History:  Procedure Laterality Date  . ABDOMINAL HYSTERECTOMY      "partial"  . BUNIONECTOMY Bilateral    "right one came back after OR" (09/17/2013)  . LACERATION REPAIR Left 06/1999   Repair FPL, radial digital nerve, intrinsics, left thumb./notes 06/29/1999 (09/17/2013)  . TUBAL LIGATION       OB History   None      Home Medications    Prior to Admission medications   Medication Sig Start Date End Date Taking? Authorizing Provider  albuterol (PROVENTIL HFA;VENTOLIN HFA) 108 (90 Base) MCG/ACT inhaler Inhale 1-2 puffs into the lungs every 6 (six) hours as needed for wheezing or shortness of breath. 08/15/17   Penny Pia, MD  albuterol (PROVENTIL) (2.5 MG/3ML) 0.083% nebulizer solution Take 2.5 mg by nebulization every 6 (six) hours as needed for wheezing or shortness of breath.     [provider]  amLODipine (NORVASC) 5 MG tablet Take 1 tablet (5 mg total) by mouth daily. 03/30/18   Delano Metz, MD  aspirin 81 MG tablet Take 81 mg by mouth daily.    [provider]  azithromycin (ZITHROMAX) 250 MG tablet Take 2 tablets (500 mg) on  Day 1,  followed by 1 tablet (250 mg) once daily on Days 2 through 5. 06/17/18 06/22/18  Mannam, Colbert Coyer, MD  clonazePAM (KLONOPIN) 0.5 MG tablet Take 1 tablet by mouth 2 (two) times daily as needed for anxiety. 01/31/18   [provider]  DULoxetine (CYMBALTA) 60 MG  capsule Take 60 mg by mouth daily. 07/22/17   [provider]  ferrous sulfate 325 (65 FE) MG tablet Take 1 tablet (325 mg total) by mouth daily with breakfast. 09/01/16   Rhetta MuraSamtani, [name], MD  Fluticasone-Umeclidin-Vilant (TRELEGY ELLIPTA) 100-62.5-25 MCG/INH AEPB Inhale 1 puff into the lungs daily.    [provider]  furosemide (LASIX) 20 MG tablet Take 1 tablet (20 mg total) by mouth daily. 03/30/18   Delano MetzSchertz, Robert, MD  ipratropium-albuterol (DUONEB) 0.5-2.5 (3) MG/3ML SOLN Take 3 mLs by nebulization every 6 (six) hours as needed. J44.9 06/17/18   Mannam, Colbert CoyerPraveen, MD  lisinopril (PRINIVIL,ZESTRIL) 20 MG tablet  Take 1 tablet (20 mg total) by mouth daily. 03/29/18 03/29/19  Delano MetzSchertz, Robert, MD  LORazepam (ATIVAN) 0.5 MG tablet Take 1 tablet (0.5 mg total) by mouth every 8 (eight) hours as needed for up to 30 doses for anxiety. 03/29/18   Delano MetzSchertz, Robert, MD  montelukast (SINGULAIR) 10 MG tablet Take 10 mg by mouth at bedtime.    [provider]  omeprazole (PRILOSEC) 20 MG capsule Take 20 mg by mouth daily.    [provider]  predniSONE (DELTASONE) 10 MG tablet 4 tabs x 3 days, 3 tabs x 3 days, 2 tab x 3 days, 1 tab x 3 days 06/17/18   Chilton GreathouseMannam, Praveen, MD    Family History Family History  Problem Relation Age of Onset  . Brain cancer Mother     Social History Social History   Tobacco Use  . Smoking status: Current Every Day Smoker    Packs/day: 0.30    Years: 40.00    Pack years: 12.00    Types: Cigarettes    Last attempt to quit: 09/18/2014    Years since quitting: 3.7  . Smokeless tobacco: Never Used  . Tobacco comment: 09/17/2013 "weaned down from 2 ppd of cigarettes"  Substance Use Topics  . Alcohol use: No    Alcohol/week: 6.0 standard drinks    Types: 6 Cans of beer per week    Comment: Quit 2 weeks  . Drug use: Yes    Types: Marijuana, Cocaine     Allergies   Patient has no known allergies.   Review of Systems Review of Systems  Unable to perform ROS: Acuity of condition  Constitutional: Negative for fever.  Respiratory: Positive for cough, shortness of breath and wheezing.   Cardiovascular: Positive for leg swelling. Negative for chest pain.  Gastrointestinal: Negative for abdominal pain, nausea and vomiting.  All other systems reviewed and are negative.    Physical Exam Updated Vital Signs BP 115/61   Pulse 96   Temp 97.8 F (36.6 C) (Axillary)   Resp 14   Ht 1.575 m (5\' 2" )   Wt 47 kg   SpO2 100%   BMI 18.95 kg/m   Physical Exam  Constitutional: She is oriented to person, place, and time.  Frail chronically ill-appearing woman,  respiratory distress noted  HENT:  Head: Normocephalic and atraumatic.  Eyes: Pupils are equal, round, and reactive to light.  Neck: Neck supple.  Cardiovascular: Normal rate, regular rhythm and normal heart sounds.  Pulmonary/Chest:  Poor air movement, only scant expiratory wheeze noted, tachypnea with increased work of breathing and BiPAP in place  Abdominal: Soft. Bowel sounds are normal. There is no tenderness.  Musculoskeletal: She exhibits edema.  1+ pitting edema bilaterally  Neurological: She is alert and oriented to person, place, and time.  Skin: Skin is warm and dry.  Psychiatric: She  has a normal mood and affect.  Nursing note and vitals reviewed.    ED Treatments / Results  Labs (all labs ordered are listed, but only abnormal results are displayed) Labs Reviewed  CBC WITH DIFFERENTIAL/PLATELET - Abnormal; Notable for the following components:      Result Value   MCHC 28.1 (*)    RDW 17.1 (*)    All other components within normal limits  COMPREHENSIVE METABOLIC PANEL - Abnormal; Notable for the following components:   AST 62 (*)    ALT 46 (*)    All other components within normal limits  I-STAT ARTERIAL BLOOD GAS, ED - Abnormal; Notable for the following components:   pH, Arterial 7.308 (*)    pCO2 arterial 69.7 (*)    pO2, Arterial 123.0 (*)    Bicarbonate 35.2 (*)    TCO2 37 (*)    Acid-Base Excess 6.0 (*)    All other components within normal limits  CULTURE, BLOOD (ROUTINE X 2)  CULTURE, BLOOD (ROUTINE X 2)  BRAIN NATRIURETIC PEPTIDE  URINALYSIS, ROUTINE W REFLEX MICROSCOPIC  I-STAT TROPONIN, ED    EKG EKG Interpretation  Date/Time:  Saturday June 21 2018 23:27:29 EST Ventricular Rate:  89 PR Interval:    QRS Duration: 96 QT Interval:  361 QTC Calculation: 440 R Axis:   58 Text Interpretation:  Sinus rhythm RSR' in V1 or V2, probably normal variant Left ventricular hypertrophy Confirmed by Ross Marcus ([id number]) on 06/21/2018 11:57:53  PM   Radiology Dg Chest Portable 1 View  Result Date: 06/21/2018 CLINICAL DATA:  Respiratory distress. EXAM: PORTABLE CHEST 1 VIEW COMPARISON:  03/25/2018. FINDINGS: 2339 hours. Lungs are hyperexpanded. Interstitial markings are diffusely coarsened with chronic features. The cardio pericardial silhouette is enlarged. The biapical hazy opacity seen previously is stable. Bones are diffusely demineralized. Telemetry leads overlie the chest. IMPRESSION: Cardiomegaly with emphysema.  No acute cardiopulmonary findings. Electronically Signed   By: Kennith Center M.D.   On: 06/21/2018 23:57    Procedures Procedures (including critical care time)   CRITICAL CARE Performed by: Shon Baton   Total critical care time: 45 minutes  Critical care time was exclusive of separately billable procedures and treating other patients.  Critical care was necessary to treat or prevent imminent or life-threatening deterioration.  Critical care was time spent personally by me on the following activities: development of treatment plan with patient and/or surrogate as well as nursing, discussions with consultants, evaluation of patient's response to treatment, examination of patient, obtaining history from patient or surrogate, ordering and performing treatments and interventions, ordering and review of laboratory studies, ordering and review of radiographic studies, pulse oximetry and re-evaluation of patient's condition.  Medications Ordered in ED Medications  levofloxacin (LEVAQUIN) IVPB 500 mg (has no administration in time range)  furosemide (LASIX) injection 20 mg (has no administration in time range)  albuterol (PROVENTIL,VENTOLIN) solution continuous neb (15 mg Nebulization Given 06/22/18 0005)     Initial Impression / Assessment and Plan / ED Course  I have reviewed the triage vital signs and the nursing notes.  Pertinent labs & imaging results that were available during my care of the patient  were reviewed by me and considered in my medical decision making (see chart for details).     Patient presents with respiratory distress.  History of COPD.  Also history of volume overload.  She continues to use cocaine and smoke.  Recent evaluation by pulmonology and treated with prednisone taper and a Z-Pak.  She has increased work of breathing and poor air movement on exam.  She continues to be on BiPAP.  Denies any infectious symptoms.  She is afebrile.  EMS reported O2 sats in the low 80s.  Lab work obtained including an ABG.  She appears to be in hypercarbic/toxic respiratory failure.  She has a slight respiratory acidosis.  No significant leukocytosis.  No significant metabolic derangements.  On exam, her presentation is most consistent with a COPD exacerbation.  She is given continuous DuoNeb.  She received steroids by EMS.  Additionally, she does have some lower extremity swelling.  No significant edema on chest x-ray.  However, 20 of IV Lasix given for any component of volume overload that may be present.  Patient was also given IV Levaquin for her COPD.  She will need admission.  Will discuss with admitting hospitalist for admission to the stepdown unit.    Final Clinical Impressions(s) / ED Diagnoses   Final diagnoses:  Acute on chronic respiratory failure with hypoxia and hypercapnia (HCC)  Chronic obstructive pulmonary disease with acute exacerbation [facility])    ED Discharge Orders    None       Wilkie Aye, Mayer Masker, MD 06/22/18 [phone]

## 2018-06-22 NOTE — Progress Notes (Signed)
Sitting up at end of bed saying she can't breath and wants bipap mask off. Tubing pulled out of mask by pt and sats 78%. Mask removed and [place] placed at 7 liters. Pt diaphoretic and agitated. RT into room.

## 2018-06-22 NOTE — Consult Note (Signed)
Consultation Note Date: 06/22/2018   Patient Name: Vanessa Wallace  DOB: April 06, 1956  MRN: [id number]  Age / Sex: 62 y.o., female  PCP: Regino Bellowamos, Arlene G, MD Referring Physician: Cathren Harshai, Ripudeep K, MD  Reason for Consultation: Establishing goals of care and Psychosocial/spiritual support  HPI/Patient Profile: 62 y.o. female  with past medical history of COPD, D-HF, tobacco and cocaine abuse, anxiety, depression and arthritis who was admitted on 06/21/2018 with shortness of breath.  She was found to be in hypercapnic respiratory failure and required BiPAP.  Unfortunately the patient has been not been tolerating the BiPAP well and has pulled it off.  When she does her oxygen saturation plummets into the 70s.  Patient had a similar admission in Sept 2019.  Her code status at that time was DNR.  Her echocardiogram in September showed pulmonary artery arterial pressure of 67 mmhg with diastolic HF grade 1 and an EF of 55%.   She saw Dr. Isaiah SergeMannam outpatient on 06/17/2018.  She has had an abnormal CT with biapical soft tissue densities and is due for a follow up PET scan for further evaluation.  Dr. Isaiah SergeMannam felt her severe anxiety may prevent her from tolerating a PET scan.   He recommended Palliative discussions be continued.  Clinical Assessment and Goals of Care:  I have reviewed medical records including EPIC notes, labs and imaging, received report from Dr. Isidoro Donningai, assessed the patient and then spoke on the phone with Payton Sparkobert Lobue her son to arrange a time  to discuss diagnosis prognosis, GOC, EOL wishes, disposition and options.  Molly MaduroRobert confirmed that he is the appropriate person to make health care decisions for his mother if she is unable to do so.  I explained to Molly MaduroRobert that his mother is not tolerating BiPAP well and consequently she is not able to allow us to assist her in breathing well.  I asked Molly MaduroRobert if he mother had  ever expressed any wishes about being on life support if she could not breathe well.  Robert immediately replied that she would want to be on a breathing machine (life support) if needed.    We arranged to meet tomorrow (Monday) 12/9 at noon.   Primary Decision Maker:  NEXT OF KIN son Molly MaduroRobert    SUMMARY OF RECOMMENDATIONS    Full scope treatment, full code PMT meeting scheduled with Sterling BigSon, Robert, for GOC on 12/9 at noon. Will place nicotine patch.  Wonder whether or not she is in cocaine withdraw.  Code Status/Advance Care Planning:  Full    Prognosis: long term prognosis is very poor given advanced COPD ongoing tobacco and cocaine abuse and in ability to tolerate medical interventions due to anxiety.  Discharge Planning: To Be Determined      Primary Diagnoses: Present on Admission: . COPD with acute exacerbation (HCC) . Acute on chronic respiratory failure with hypoxia and hypercapnia (HCC) . Anxiety . Transaminasemia   I have reviewed the medical record, interviewed the patient and family, and examined the patient. The following aspects are  pertinent.  Past Medical History:  Diagnosis Date  . Anxiety   . Arthritis    "all over" (09/17/2013)  . Asthma   . CAP (community acquired pneumonia) 09/17/2013  . Chronic bronchitis (HCC)    "flares up w/my asthma" (09/17/2013)  . Cocaine abuse (HCC)   . COPD (chronic obstructive pulmonary disease) (HCC)   . Hypertension   . Migraines    "weekly lately" (09/17/2013)   Social History   Socioeconomic History  . Marital status: Single    Spouse name: Not on file  . Number of children: Not on file  . Years of education: Not on file  . Highest education level: Not on file  Occupational History  . Not on file  Social Needs  . Financial resource strain: Not on file  . Food insecurity:    Worry: Not on file    Inability: Not on file  . Transportation needs:    Medical: Not on file    Non-medical: Not on file  Tobacco Use    . Smoking status: Current Every Day Smoker    Packs/day: 0.30    Years: 40.00    Pack years: 12.00    Types: Cigarettes    Last attempt to quit: 09/18/2014    Years since quitting: 3.7  . Smokeless tobacco: Never Used  . Tobacco comment: 09/17/2013 "weaned down from 2 ppd of cigarettes"  Substance and Sexual Activity  . Alcohol use: No    Alcohol/week: 6.0 standard drinks    Types: 6 Cans of beer per week    Comment: Quit 2 weeks  . Drug use: Yes    Types: Marijuana, Cocaine  . Sexual activity: Yes  Lifestyle  . Physical activity:    Days per week: Not on file    Minutes per session: Not on file  . Stress: Not on file  Relationships  . Social connections:    Talks on phone: Not on file    Gets together: Not on file    Attends religious service: Not on file    Active member of club or organization: Not on file    Attends meetings of clubs or organizations: Not on file    Relationship status: Not on file  Other Topics Concern  . Not on file  Social History Narrative  . Not on file   Family History  Problem Relation Age of Onset  . Brain cancer Mother    Scheduled Meds: . amLODipine  5 mg Oral Daily  . aspirin EC  81 mg Oral Daily  . DULoxetine  60 mg Oral Daily  . enoxaparin (LOVENOX) injection  40 mg Subcutaneous Q24H  . fluticasone furoate-vilanterol  1 puff Inhalation Daily  . furosemide  20 mg Oral Daily  . ipratropium-albuterol  3 mL Nebulization TID  . lisinopril  20 mg Oral Daily  . methylPREDNISolone (SOLU-MEDROL) injection  60 mg Intravenous Q6H  . montelukast  10 mg Oral QHS  . pantoprazole  40 mg Oral Daily  . sodium chloride flush  3 mL Intravenous Q12H  . sodium chloride flush  3 mL Intravenous Q12H  . umeclidinium bromide  1 puff Inhalation Daily   Continuous Infusions: . sodium chloride    . [START ON 06/23/2018] levofloxacin (LEVAQUIN) IV     PRN Meds:.sodium chloride, acetaminophen **OR** acetaminophen, albuterol, clonazePAM, ondansetron **OR**  ondansetron (ZOFRAN) IV, sodium chloride flush No Known Allergies Review of Systems patient unable to provide  Physical Exam  Thin cachectic female.  Awake, Bipap on with poor seal.  Confused moving head and arms indiscriminately. Resp poor air movement.  Some wheeze on anterior right CV tachy Abdomen thin soft, nt, nd  Vital Signs: BP (!) 176/96   Pulse (!) 115   Temp 99 F (37.2 C) (Oral)   Resp (!) 30   Ht 5\' 2"  (1.575 m)   Wt 42.6 kg   SpO2 98%   BMI 17.19 kg/m  Pain Scale: 0-10   Pain Score: 0-No pain(denies)   SpO2: SpO2: 98 % O2 Device:SpO2: 98 % O2 Flow Rate: .O2 Flow Rate (L/min): 10 L/min  IO: Intake/output summary:   Intake/Output Summary (Last 24 hours) at 06/22/2018 0917 Last data filed at 06/22/2018 0345 Gross per 24 hour  Intake 3 ml  Output -  Net 3 ml    LBM:   Baseline Weight: Weight: 47 kg Most recent weight: Weight: 42.6 kg     Palliative Assessment/Data:  20% at this time     Time In: 8:50 Time Out: 9:36 Time Total: 46 min. Greater than 50%  of this time was spent counseling and coordinating care related to the above assessment and plan.  Signed by: Norvel Richards, PA-C Palliative Medicine Pager: [phone]  Please contact Palliative Medicine Team phone at [phone] for questions and concerns.  For individual provider: See Loretha Stapler

## 2018-06-22 NOTE — Progress Notes (Signed)
Critical ABG values give to Dr. Everardo AllEllison.

## 2018-06-22 NOTE — Procedures (Signed)
Intubation Procedure Note Eliot FordVivian A Muhlestein [id number] 1956-06-21  Procedure: Intubation Indications: Respiratory insufficiency  Procedure Details Consent: Unable to obtain consent because of emergent medical necessity. Time Out: Verified patient identification, verified procedure, site/side was marked, verified correct patient position, special equipment/implants available, medications/allergies/relevent history reviewed, required imaging and test results available.  Performed  Maximum sterile technique was used including gloves, gown, hand hygiene and mask.  Miller 2  Grade 1 view under direct laryngoscopy.  ET tube insertion between vocal cords visualized.  CO2 detector positive for color change.  Bilateral breath sounds present and equal   Evaluation Hemodynamic Status: BP stable throughout; O2 sats: stable throughout Patient's Current Condition: stable Complications: No apparent complications Patient did tolerate procedure well. Chest X-ray ordered to verify placement.  CXR: tube position acceptable.   Yajaira Doffing Mechele CollinJane Chieko Neises 06/22/2018

## 2018-06-22 NOTE — Progress Notes (Signed)
RT called to assess pt in respiratory distress.  PT on 5L [place] with sats 100%, RR 30, HR 115 and pt leaning over in distress.  RN at bedside giving ativan.  Duoneb given.  BBS diminished with exp. Wheezes throughout.   RN also gave solumedrol.

## 2018-06-22 NOTE — Progress Notes (Signed)
Pt transported on Bipap from 6E to 3M11 without complications.

## 2018-06-22 NOTE — H&P (Addendum)
History and Physical    Vanessa Wallace ZOX:[id number] DOB: 07-25-1955 DOA: 06/21/2018  PCP: Regino Bellow, MD   Patient coming from: Home   Chief Complaint: SOB, respiratory distress   HPI: Vanessa Wallace is a 62 y.o. female with medical history significant for COPD with chronic hypoxic and hypercarbic respiratory failure, chronic diastolic CHF, drug abuse, depression with anxiety, and hypertension, now presenting to the emergency department for evaluation of shortness of breath.  She had seen a pulmonologist on 06/17/2018, had diffuse wheezing and dyspnea with anxiety suspected to be contributing.  She was started on azithromycin and prednisone during that visit, but per report of her friend at the bedside, she developed worsening shortness of breath and cough 2 to 3 days ago, and was then in acute respiratory distress tonight.  She is unable to contribute much to the history at this time due to being on BiPAP and in acute distress, but she denies chest pain, rhinorrhea, or fevers.  Per the friend at bedside, she has not been complaining of anything other than being short of breath.  EMS was called, found the patient to be saturating 81% on her usual 3 L/min of supplemental oxygen.  She was treated with 125 mg of IV Solu-Medrol, 2 g magnesium, DuoNeb's, and CPAP prior to arrival in the ED.  ED Course: Upon arrival to the ED, patient is found to be with afebrile, saturating 81%, tachypneic, slightly tachycardic, and with stable blood pressure.  EKG features sinus rhythm with LVH.  Chest x-ray is notable for cardiomegaly with emphysema, but no acute findings.  Chemistry panel features mild elevation in transaminases.  CBC is unremarkable and troponin is normal.  Blood cultures were collected and the patient was given 20 mg IV Lasix and Levaquin in the ED.  She is reportedly improved since time of arrival, but remains in distress, requiring BiPAP, and will be admitted to the stepdown unit for ongoing  evaluation and management of COPD with acute exacerbation.  Review of Systems:  Unable to complete ROS secondary to the patient's clinical condition.  Past Medical History:  Diagnosis Date  . Anxiety   . Arthritis    "all over" (09/17/2013)  . Asthma   . CAP (community acquired pneumonia) 09/17/2013  . Chronic bronchitis (HCC)    "flares up w/my asthma" (09/17/2013)  . Cocaine abuse (HCC)   . COPD (chronic obstructive pulmonary disease) (HCC)   . Hypertension   . Migraines    "weekly lately" (09/17/2013)    Past Surgical History:  Procedure Laterality Date  . ABDOMINAL HYSTERECTOMY     "partial"  . BUNIONECTOMY Bilateral    "right one came back after OR" (09/17/2013)  . LACERATION REPAIR Left 06/1999   Repair FPL, radial digital nerve, intrinsics, left thumb./notes 06/29/1999 (09/17/2013)  . TUBAL LIGATION       reports that she has been smoking cigarettes. She has a 12.00 pack-year smoking history. She has never used smokeless tobacco. She reports that she has current or past drug history. Drugs: Marijuana and Cocaine. She reports that she does not drink alcohol.  No Known Allergies  Family History  Problem Relation Age of Onset  . Brain cancer Mother      Prior to Admission medications   Medication Sig Start Date End Date Taking? Authorizing Provider  albuterol (PROVENTIL HFA;VENTOLIN HFA) 108 (90 Base) MCG/ACT inhaler Inhale 1-2 puffs into the lungs every 6 (six) hours as needed for wheezing or shortness of breath. 08/15/17  Penny PiaVega, [place], MD  albuterol (PROVENTIL) (2.5 MG/3ML) 0.083% nebulizer solution Take 2.5 mg by nebulization every 6 (six) hours as needed for wheezing or shortness of breath.     [provider]  amLODipine (NORVASC) 5 MG tablet Take 1 tablet (5 mg total) by mouth daily. 03/30/18   Delano MetzSchertz, Robert, MD  aspirin 81 MG tablet Take 81 mg by mouth daily.    [provider]  clonazePAM (KLONOPIN) 0.5 MG tablet Take 1 tablet by mouth 2 (two) times  daily as needed for anxiety. 01/31/18   [provider]  DULoxetine (CYMBALTA) 60 MG capsule Take 60 mg by mouth daily. 07/22/17   [provider]  ferrous sulfate 325 (65 FE) MG tablet Take 1 tablet (325 mg total) by mouth daily with breakfast. 09/01/16   Rhetta MuraSamtani, [name], MD  Fluticasone-Umeclidin-Vilant (TRELEGY ELLIPTA) 100-62.5-25 MCG/INH AEPB Inhale 1 puff into the lungs daily.    [provider]  furosemide (LASIX) 20 MG tablet Take 1 tablet (20 mg total) by mouth daily. 03/30/18   Delano MetzSchertz, Robert, MD  ipratropium-albuterol (DUONEB) 0.5-2.5 (3) MG/3ML SOLN Take 3 mLs by nebulization every 6 (six) hours as needed. J44.9 06/17/18   Mannam, Colbert CoyerPraveen, MD  lisinopril (PRINIVIL,ZESTRIL) 20 MG tablet Take 1 tablet (20 mg total) by mouth daily. 03/29/18 03/29/19  Delano MetzSchertz, Robert, MD  LORazepam (ATIVAN) 0.5 MG tablet Take 1 tablet (0.5 mg total) by mouth every 8 (eight) hours as needed for up to 30 doses for anxiety. 03/29/18   Delano MetzSchertz, Robert, MD  montelukast (SINGULAIR) 10 MG tablet Take 10 mg by mouth at bedtime.    [provider]  omeprazole (PRILOSEC) 20 MG capsule Take 20 mg by mouth daily.    [provider]  predniSONE (DELTASONE) 10 MG tablet 4 tabs x 3 days, 3 tabs x 3 days, 2 tab x 3 days, 1 tab x 3 days 06/17/18   Chilton GreathouseMannam, Praveen, MD    Physical Exam: Vitals:   06/22/18 0030 06/22/18 0045 06/22/18 0100 06/22/18 0130  BP: 123/68 115/61 (!) 141/83 126/74  Pulse: 94 96 99 94  Resp: 14 14 18 13   Temp:      TempSrc:      SpO2: 100% 100% 100% 97%  Weight:      Height:        Constitutional: Using accessory muscles to breath, cachectic, no pallor, no diaphoresis  Eyes: PERTLA, lids and conjunctivae normal ENMT: Mucous membranes are moist. Posterior pharynx clear of any exudate or lesions.   Neck: normal, supple, no masses, no thyromegaly Respiratory: Prolonged expiratory phase with diffuse wheezes. Accessory muscle recruitment. No pallor or  cyanosis. Symmetric chest expansion.  Cardiovascular: Rate ~100 and regular. 1+ pitting edema lower legs bilaterally. Abdomen: No distension, no tenderness, soft. Bowel sounds active.  Musculoskeletal: no clubbing / cyanosis. No joint deformity upper and lower extremities.    Skin: no significant rashes, lesions, ulcers. Warm, dry, well-perfused. Neurologic: No facial asymmetry. Sensation intact. Moving all extremities.  Psychiatric: Difficult to assess given clinical condition with respiratory distress on BiPAP.    Labs on Admission: I have personally reviewed following labs and imaging studies  CBC: Recent Labs  Lab 06/21/18 2339  WBC 7.0  NEUTROABS 5.5  HGB 12.3  HCT 43.7  MCV 93.6  PLT 229   Basic Metabolic Panel: Recent Labs  Lab 06/21/18 2339  NA 143  K 4.2  CL 99  CO2 32  GLUCOSE 94  BUN 17  CREATININE 0.67  CALCIUM  9.3   GFR: Estimated Creatinine Clearance: 55.5 mL/min (by C-G formula based on SCr of 0.67 mg/dL). Liver Function Tests: Recent Labs  Lab 06/21/18 2339  AST 62*  ALT 46*  ALKPHOS 66  BILITOT 0.7  PROT 8.1  ALBUMIN 3.7   No results for input(s): LIPASE, AMYLASE in the last 168 hours. No results for input(s): AMMONIA in the last 168 hours. Coagulation Profile: No results for input(s): INR, PROTIME in the last 168 hours. Cardiac Enzymes: No results for input(s): CKTOTAL, CKMB, CKMBINDEX, TROPONINI in the last 168 hours. BNP (last 3 results) No results for input(s): PROBNP in the last 8760 hours. HbA1C: No results for input(s): HGBA1C in the last 72 hours. CBG: No results for input(s): GLUCAP in the last 168 hours. Lipid Profile: No results for input(s): CHOL, HDL, LDLCALC, TRIG, CHOLHDL, LDLDIRECT in the last 72 hours. Thyroid Function Tests: No results for input(s): TSH, T4TOTAL, FREET4, T3FREE, THYROIDAB in the last 72 hours. Anemia Panel: No results for input(s): VITAMINB12, FOLATE, FERRITIN, TIBC, IRON, RETICCTPCT in the last 72  hours. Urine analysis: No results found for: COLORURINE, APPEARANCEUR, LABSPEC, PHURINE, GLUCOSEU, HGBUR, BILIRUBINUR, KETONESUR, PROTEINUR, UROBILINOGEN, NITRITE, LEUKOCYTESUR Sepsis Labs: @LABRCNTIP (procalcitonin:4,lacticidven:4) )No results found for this or any previous visit (from the past 240 hour(s)).   Radiological Exams on Admission: Dg Chest Portable 1 View  Result Date: 06/21/2018 CLINICAL DATA:  Respiratory distress. EXAM: PORTABLE CHEST 1 VIEW COMPARISON:  03/25/2018. FINDINGS: 2339 hours. Lungs are hyperexpanded. Interstitial markings are diffusely coarsened with chronic features. The cardio pericardial silhouette is enlarged. The biapical hazy opacity seen previously is stable. Bones are diffusely demineralized. Telemetry leads overlie the chest. IMPRESSION: Cardiomegaly with emphysema.  No acute cardiopulmonary findings. Electronically Signed   By: Kennith Center M.D.   On: 06/21/2018 23:57    EKG: Independently reviewed. Sinus rhythm, LVH.   Assessment/Plan   1. COPD with acute exacerbation; acute on chronic respiratory failure with hypercarbia and hypoxia  - Presents in acute respiratory distress after developing worsening cough and SOB a few days ago despite recent treatment with azithromycin and azithromycin  - She was in acute distress with EMS, saturating 81% on her usual 3 Lpm supplemnetal O2  - No edema or consolidation on CXR  - ABG with mild respiratory acidosis, pCO2 70 (previously 50's)  - Treated by EMS with 125 mg IV Solu-Medrol, 2g IV mag, duoneb, and CPAP  - Given Lasix, Levaquin, and started on BiPAP in ED  - Check sputum culture, continue systemic steroid, continue Levaquin, schedule duonebs, continue ICS/LABA/LAMA, albuterol nebs as-needed   2. Chronic diastolic CHF  - There is some LE edema noted and wt appears to be up, but no vascular congestion or edema noted on CXR  - She was treated in ED with Lasix 20 mg IV  - SLIV, follow I/O's and daily wt,  continue lisinopril, resume her usual dose oral Lasix in am    3. Anxiety  - She appear quite anxious in ED, asking to remove BiPAP mask  - Will give dose of Ativan IV now to help her tolerate BiPAP   - Resume Cymbalta and Klonopin once off of BiPAP   4. Elevated transaminases  - Mild elevation in transaminases noted, similar to September when she had negative viral hepatitis panel  - Check RUQ [place]   5. Hypertension  - BP at goal  - Continue lisinopril and Norvasc    DVT prophylaxis: Lovenox  Code Status: Full  Family Communication: Significant other  updated at bedside Consults called: None Admission status: Inpatient     Briscoe Deutscher, MD [facility] Pager [phone]  If 7PM-7AM, please contact night-coverage [email or web address] Password [email or web address]  06/22/2018, 1:55 AM

## 2018-06-22 NOTE — Progress Notes (Signed)
RT in to see pt for breathing tx and BIPAP check. Pt woke up almost in a panic stating she had to pee. RT called for assistance, pt sat up on edge of bed with legs hanging over railing, pulled off BIPAP mask, and started to get up. Tech was in room at that point getting bedside commode for pt, and RN came in right after. Pt placed on 10L salter HFNC. Pt c/o being hungry. RT asked pt if she was ready to go back on BIPAP, pt just stating that she is hungry and wants to eat. Pt unable to talk in complete sentences and is in obvious distress. RT will continue to monitor.

## 2018-06-22 NOTE — Consult Note (Signed)
NAME:  Vanessa FordVivian A Wallace, MRN:  [id number], DOB:  April 20, 1956, LOS: 0 ADMISSION DATE:  06/21/2018, CONSULTATION DATE:  12/819 REFERRING MD:  Dr. Isidoro Donningai, MD CHIEF COMPLAINT:  Altered mental status, respiratory acidosis  Brief History    62 year old female with severe emphysema with chronic hypercapnic and hypoxemic respiratory failure transferred from stepdown unit for persistent respiratory acidosis and worsening mental status refractory to BiPAP.  Transferred to PCCM for management of acute on chronic hypercapnic and hypoxic respiratory failure requiring intubation.  History of present illness   Ms. Vanessa Wallace is a 62 year old female with severe emphysema on supplemental oxygen and nocturnal ventilation, chronic diastolic heart failure, history of cocaine abuse, active tobacco use, anxiety who presented with shortness of breath, wheezing and hypoxemia on home 3 L O2.  With EMS she was treated with IV Solu-Medrol, nebulizers, 2 g magnesium and CPAP.  In the ED, she was given Lasix 20 mg and Levaquin.  ABG demonstrated respiratory acidosis.  She required BiPAP and was admitted to stepdown unit for COPD exacerbation.  Overnight worsening respiratory status and mental status on the floor. ABG with worsening hypercapneia despite continuous BiPAP since 11pm last night.  Transferred to PCCM for acute on chronic hypercapneic and hypoxemic respiratory failure.  Past Medical History  COPD Chronic hypoxemic and hypercapneic respiratory failure  Chronic diastolic heart failure  Significant Hospital Events   12/7 Admitted 12/8 PCCM  Consults:  PCCM>12/8  Procedures:  ETT 12/8>  Significant Diagnostic Tests:  ABG 12/7 00:37 7.3/69/123 ABG 12/7 09:00 7.26/81/103  Micro Data:  BCx 12/7>>  Antimicrobials:  Levaquin 12/8>>  Interim history/subjective:  Transferred to PCCM  Objective   Blood pressure (!) 143/92, pulse 90, temperature 98.9 F (37.2 C), temperature source Axillary, resp. rate (!)  22, height 5\' 2"  (1.575 m), weight 42.6 kg, SpO2 98 %.    FiO2 (%):  [40 %-50 %] 50 %   Intake/Output Summary (Last 24 hours) at 06/22/2018 1105 Last data filed at 06/22/2018 0345 Gross per 24 hour  Intake 3 ml  Output -  Net 3 ml   Filed Weights   06/21/18 2329 06/22/18 0500  Weight: 47 kg 42.6 kg    Examination: General: Lethargic, does not follow commands HENT: [place], wearing BiPAP Lungs: Decreased breath sounds, diffuse wheezing Cardiovascular: RRR, no m/g/r Abdomen: Soft, NTTP, BS+ Extremities: No LE edema, moves extremities x 4 Neuro: Obtunded, does not follow commands GU: External foley in place  Resolved Hospital Problem list     Assessment & Plan:   #Acute on chronic hypercapneic and hypoxemic respiratory failure #Severe Emphysema --Plan to intubate --Start mechanical ventilation --Repeat ABG post-intubation --Continue IV steroids, levaquin and scheduled nebulizers  #Altered mental status Likely related to hypercapneia. Received ativan this morning for anxiety. CBG wnl. --Management as above  #Chronic diastolic heart failure  Euvolemic on exam. S/p IV lasix in ED --Strict I&Os --Continue home PO lasix with goal net even  #Anxiety --Hold home klonopin for AMS  #HTN Pressures low-normal --Hold home anti-hypertensives for now  #Goals of Care Previously DNR. Patient scheduled with Palliative Care. --Will continue discussions regarding goals of care with son --Palliative Care consult  Best practice:  Diet: NPO Pain/Anxiety/Delirium protocol (if indicated): MV  VAP protocol (if indicated): yes DVT prophylaxis: Lovenox GI prophylaxis: PPI Glucose control: None Mobility: Bed rest Code Status: Full Code Family Communication: Primary team communicated with son Disposition: Transfer to ICU  Labs   CBC: Recent Labs  Lab 06/21/18 2339 06/22/18 0419  WBC 7.0 6.9  NEUTROABS 5.5 6.4  HGB 12.3 11.3*  HCT 43.7 39.0  MCV 93.6 91.5  PLT 229 216     Basic Metabolic Panel: Recent Labs  Lab 06/21/18 2339 06/22/18 0419  NA 143 142  K 4.2 4.2  CL 99 99  CO2 32 32  GLUCOSE 94 105*  BUN 17 19  CREATININE 0.67 0.72  CALCIUM 9.3 8.9   GFR: Estimated Creatinine Clearance: 50.3 mL/min (by C-G formula based on SCr of 0.72 mg/dL). Recent Labs  Lab 06/21/18 2339 06/22/18 0419  WBC 7.0 6.9    Liver Function Tests: Recent Labs  Lab 06/21/18 2339  AST 62*  ALT 46*  ALKPHOS 66  BILITOT 0.7  PROT 8.1  ALBUMIN 3.7   No results for input(s): LIPASE, AMYLASE in the last 168 hours. No results for input(s): AMMONIA in the last 168 hours.  ABG    Component Value Date/Time   PHART 7.262 (L) 06/22/2018 0900   PCO2ART 81.3 (HH) 06/22/2018 0900   PO2ART 103 06/22/2018 0900   HCO3 35.4 (H) 06/22/2018 0900   TCO2 37 (H) 06/22/2018 0037   ACIDBASEDEF 2.0 08/08/2017 0512   O2SAT 97.0 06/22/2018 0900     Coagulation Profile: No results for input(s): INR, PROTIME in the last 168 hours.  Cardiac Enzymes: No results for input(s): CKTOTAL, CKMB, CKMBINDEX, TROPONINI in the last 168 hours.  HbA1C: Hgb A1c MFr Bld  Date/Time Value Ref Range Status  08/30/2016 06:46 PM 5.5 4.8 - 5.6 % Final    Comment:    (NOTE)         Pre-diabetes: 5.7 - 6.4         Diabetes: >6.4         Glycemic control for adults with diabetes: <7.0     CBG: No results for input(s): GLUCAP in the last 168 hours.  Review of Systems:   Unable to obtain due to mental status  Past Medical History  She,  has a past medical history of Anxiety, Arthritis, Asthma, CAP (community acquired pneumonia) (09/17/2013), Chronic bronchitis (HCC), Cocaine abuse (HCC), COPD (chronic obstructive pulmonary disease) (HCC), Hypertension, and Migraines.   Surgical History    Past Surgical History:  Procedure Laterality Date  . ABDOMINAL HYSTERECTOMY     "partial"  . BUNIONECTOMY Bilateral    "right one came back after OR" (09/17/2013)  . LACERATION REPAIR Left 06/1999    Repair FPL, radial digital nerve, intrinsics, left thumb./notes 06/29/1999 (09/17/2013)  . TUBAL LIGATION       Social History   reports that she has been smoking cigarettes. She has a 12.00 pack-year smoking history. She has never used smokeless tobacco. She reports that she has current or past drug history. Drugs: Marijuana and Cocaine. She reports that she does not drink alcohol.   Family History   Her family history includes Brain cancer in her mother.   Allergies No Known Allergies   Home Medications  Prior to Admission medications   Medication Sig Start Date End Date Taking? Authorizing Provider  albuterol (PROVENTIL HFA;VENTOLIN HFA) 108 (90 Base) MCG/ACT inhaler Inhale 1-2 puffs into the lungs every 6 (six) hours as needed for wheezing or shortness of breath. 08/15/17   Penny Pia, MD  albuterol (PROVENTIL) (2.5 MG/3ML) 0.083% nebulizer solution Take 2.5 mg by nebulization every 6 (six) hours as needed for wheezing or shortness of breath.     [provider]  amLODipine (NORVASC) 5 MG tablet Take 1 tablet (5 mg  total) by mouth daily. 03/30/18   Delano Metz, MD  aspirin 81 MG tablet Take 81 mg by mouth daily.    [provider]  clonazePAM (KLONOPIN) 0.5 MG tablet Take 1 tablet by mouth 2 (two) times daily as needed for anxiety. 01/31/18   [provider]  DULoxetine (CYMBALTA) 60 MG capsule Take 60 mg by mouth daily. 07/22/17   [provider]  ferrous sulfate 325 (65 FE) MG tablet Take 1 tablet (325 mg total) by mouth daily with breakfast. 09/01/16   [name], MD  Fluticasone-Umeclidin-Vilant (TRELEGY ELLIPTA) 100-62.5-25 MCG/INH AEPB Inhale 1 puff into the lungs daily.    [provider]  furosemide (LASIX) 20 MG tablet Take 1 tablet (20 mg total) by mouth daily. 03/30/18   Delano Metz, MD  ipratropium-albuterol (DUONEB) 0.5-2.5 (3) MG/3ML SOLN Take 3 mLs by nebulization every 6 (six) hours as needed. J44.9 06/17/18    Mannam, Colbert Coyer, MD  lisinopril (PRINIVIL,ZESTRIL) 20 MG tablet Take 1 tablet (20 mg total) by mouth daily. 03/29/18 03/29/19  Delano Metz, MD  LORazepam (ATIVAN) 0.5 MG tablet Take 1 tablet (0.5 mg total) by mouth every 8 (eight) hours as needed for up to 30 doses for anxiety. 03/29/18   Delano Metz, MD  montelukast (SINGULAIR) 10 MG tablet Take 10 mg by mouth at bedtime.    [provider]  omeprazole (PRILOSEC) 20 MG capsule Take 20 mg by mouth daily.    [provider]  predniSONE (DELTASONE) 10 MG tablet 4 tabs x 3 days, 3 tabs x 3 days, 2 tab x 3 days, 1 tab x 3 days 06/17/18   Chilton Greathouse, MD     Critical care time: 60 min     The patient is critically ill with multiple organ systems failure and requires high complexity decision making for assessment and support, frequent evaluation and titration of therapies, application of advanced monitoring technologies and extensive interpretation of multiple databases.   Critical Care Time devoted to patient care services described in this note is  60 Minutes. This time reflects time of care of this signee Dr. Mechele Collin. This critical care time does not reflect procedure time, or teaching time or supervisory time of PA/NP/Med student/Med Resident etc but could involve care discussion time.  Mechele Collin, M.D. [facility] Pulmonary/Critical Care Medicine. Pager: [phone]. After hours pager: [phone].

## 2018-06-22 NOTE — Progress Notes (Signed)
[facility]                                                                              Patient Demographics  Vanessa Wallace, is a 62 y.o. female, DOB - 02/25/1956, ZOX:[id number]  Admit date - 06/21/2018   Admitting Physician Briscoe Deutscherimothy S Opyd, MD  Outpatient Primary MD for the patient is Regino Bellowamos, Arlene G, MD  Outpatient specialists:   LOS - 0  days   Medical records reviewed and are as summarized below:    Chief Complaint  Patient presents with  . URI       Brief summary   Patient is a 62 year old female with history of COPD, chronic hypoxic and hypercarbic respiratory failure on 3 L of O2 at home, chronic diastolic CHF, depression with anxiety, hypertension presented to ED for shortness of breath.  Patient had seen pulmonologist on 12/3 had diffuse wheezing and dyspnea, started on Zithromax and prednisone.  Patient however developed worsening shortness of breath with coughing and was in respiratory distress at the time of admission.  Patient was placed on BiPAP in ED.  She was given 125 mg of IV Solu-Medrol, duo nebs, magnesium.  Patient was admitted to stepdown for further work-up and BiPAP   Assessment & Plan    Principal Problem: Acute on chronic respiratory failure with hypoxia and hypercapnia, COPD with acute exacerbation (HCC) -At the time of examination, patient has been on BiPAP overnight, somnolent, stat ABG showed pH 7.26, PCO2 81.3, PO2 103, worsened from CO2 of 69 last night -Discussed with patient's son who wants full code and intubation -Called CCM, discussed with Dr. Revonda StandardAllison, transferred patient to ICU for impending respiratory failure and intubation. -Patient was placed on IV steroids, IV Levaquin and received Lasix in ED  Active Problems: Chronic diastolic CHF - some lower extremity edema, on examination, still very tight in chest -Patient received 20 mg IV Lasix in ED -Follow strict I's and O's and daily weights    Anxiety -Overnight had  received Ativan to help tolerate BiPAP as patient was agitated in ED  Transaminitis -Mild elevation in LFTs similar to 9/19, negative viral hepatitis panel -Follow right upper quadrant ultrasound  Hypertension BP currently elevated, will need to be on IV antihypertensives  Code Status: Discussed with patient's son, full CODE STATUS, palliative medicine also has been consulted DVT Prophylaxis:  Lovenox  Family Communication: Discussed in detail with the patient, all imaging results, lab results explained to the patient's boyfriend at the bedside.    Disposition Plan: Transfer to intensive care unit for impending respiratory failure and intubation  Time Spent in minutes   40 minutes  Procedures:  BiPAP  Consultants:   CCM  Antimicrobials:      Medications  Scheduled Meds: . amLODipine  5 mg Oral Daily  . aspirin EC  81 mg Oral Daily  . DULoxetine  60 mg Oral Daily  . enoxaparin (LOVENOX) injection  40 mg Subcutaneous Q24H  . fluticasone furoate-vilanterol  1 puff Inhalation Daily  . furosemide  20 mg Oral Daily  . ipratropium-albuterol  3 mL Nebulization TID  . lisinopril  20 mg Oral  Daily  . methylPREDNISolone (SOLU-MEDROL) injection  60 mg Intravenous Q6H  . montelukast  10 mg Oral QHS  . nicotine  21 mg Transdermal Daily  . pantoprazole  40 mg Oral Daily  . sodium chloride flush  3 mL Intravenous Q12H  . sodium chloride flush  3 mL Intravenous Q12H  . umeclidinium bromide  1 puff Inhalation Daily   Continuous Infusions: . sodium chloride    . [START ON 06/23/2018] levofloxacin (LEVAQUIN) IV     PRN Meds:.sodium chloride, acetaminophen **OR** acetaminophen, albuterol, clonazePAM, ondansetron **OR** ondansetron (ZOFRAN) IV, sodium chloride flush   Antibiotics   Anti-infectives (From admission, onward)   Start     Dose/Rate Route Frequency Ordered Stop   06/23/18 0100  levofloxacin (LEVAQUIN) IVPB 750 mg     750 mg 100 mL/hr over 90 Minutes Intravenous Every  24 hours 06/22/18 0120 06/27/18 0059   06/22/18 0115  levofloxacin (LEVAQUIN) IVPB 500 mg     500 mg 100 mL/hr over 60 Minutes Intravenous  Once 06/22/18 0054 06/22/18 1610        Subjective:   Guadalupe Kerekes was seen and examined today.  Chest tight, on BiPAP, somnolent, unable to obtain review of system from the patient.  Family member at the bedside  Objective:   Vitals:   06/22/18 0500 06/22/18 0530 06/22/18 0745 06/22/18 0820  BP: (!) 176/96 (!) 176/96    Pulse: (!) 102 89 (!) 107 (!) 115  Resp: 18  (!) 26 (!) 30  Temp: 99 F (37.2 C)     TempSrc: Oral     SpO2: 100%  (!) 84% 98%  Weight: 42.6 kg     Height:        Intake/Output Summary (Last 24 hours) at 06/22/2018 0955 Last data filed at 06/22/2018 0345 Gross per 24 hour  Intake 3 ml  Output -  Net 3 ml     Wt Readings from Last 3 Encounters:  06/22/18 42.6 kg  06/17/18 47.2 kg  03/28/18 43.7 kg     Exam  General: Somnolent on BiPAP  Eyes:  HEENT:    Cardiovascular: S1 S2 auscultated, Regular rate and rhythm.  Respiratory:  diffuse expiratory wheezing bilaterally, tight  Gastrointestinal: Soft, nontender, nondistended, + bowel sounds  Ext: no pedal edema bilaterally  Neuro: Unable to assess, not following commands  Musculoskeletal: No digital cyanosis, clubbing  Skin: No rashes  Psych: Somnolent   Data Reviewed:  I have personally reviewed following labs and imaging studies  Micro Results Recent Results (from the past 240 hour(s))  MRSA PCR Screening     Status: Abnormal   Collection Time: 06/22/18  3:34 AM  Result Value Ref Range Status   MRSA by PCR POSITIVE (A) NEGATIVE Final    Comment:        The GeneXpert MRSA Assay (FDA approved for NASAL specimens only), is one component of a comprehensive MRSA colonization surveillance program. It is not intended to diagnose MRSA infection nor to guide or monitor treatment for MRSA infections. RESULT CALLED TO, READ BACK BY AND  VERIFIED WITH: TSOUTIS,J RN [phone] 06/22/18 MITCHELL,L     Radiology Reports Dg Chest Portable 1 View  Result Date: 06/21/2018 CLINICAL DATA:  Respiratory distress. EXAM: PORTABLE CHEST 1 VIEW COMPARISON:  03/25/2018. FINDINGS: 2339 hours. Lungs are hyperexpanded. Interstitial markings are diffusely coarsened with chronic features. The cardio pericardial silhouette is enlarged. The biapical hazy opacity seen previously is stable. Bones are diffusely demineralized. Telemetry leads overlie the chest.  IMPRESSION: Cardiomegaly with emphysema.  No acute cardiopulmonary findings. Electronically Signed   By: Kennith Center M.D.   On: 06/21/2018 23:57   US Abdomen Limited Ruq  Result Date: 06/22/2018 CLINICAL DATA:  Elevated liver function tests. EXAM: ULTRASOUND ABDOMEN LIMITED RIGHT UPPER QUADRANT COMPARISON:  None. FINDINGS: Examination technically difficult due to patient lying in fetal position throughout the exam and inability to cooperate. Gallbladder: Two tiny nonmobile gallbladder polyps are noted, each measuring approximately 2 mm. No gallstones are identified. No evidence of gallbladder wall thickening or pericholecystic fluid. Common bile duct: Diameter: 2 mm, within normal limits. Liver: No focal lesion identified. Within normal limits in parenchymal echogenicity. Portal vein is patent on color Doppler imaging with normal direction of blood flow towards the liver. Other: Diffusely increased parenchymal echogenicity of the right kidney noted, suspicious for medical renal disease. IMPRESSION: Technically difficult exam as noted above. No definite gallstones or biliary ductal dilatation. Incidentally noted increased parenchymal echogenicity of the right kidney, suspicious for medical renal disease. Electronically Signed   By: Myles Rosenthal M.D.   On: 06/22/2018 08:01    Lab Data:  CBC: Recent Labs  Lab 06/21/18 2339 06/22/18 0419  WBC 7.0 6.9  NEUTROABS 5.5 6.4  HGB 12.3 11.3*  HCT 43.7 39.0    MCV 93.6 91.5  PLT 229 216   Basic Metabolic Panel: Recent Labs  Lab 06/21/18 2339 06/22/18 0419  NA 143 142  K 4.2 4.2  CL 99 99  CO2 32 32  GLUCOSE 94 105*  BUN 17 19  CREATININE 0.67 0.72  CALCIUM 9.3 8.9   GFR: Estimated Creatinine Clearance: 50.3 mL/min (by C-G formula based on SCr of 0.72 mg/dL). Liver Function Tests: Recent Labs  Lab 06/21/18 2339  AST 62*  ALT 46*  ALKPHOS 66  BILITOT 0.7  PROT 8.1  ALBUMIN 3.7   No results for input(s): LIPASE, AMYLASE in the last 168 hours. No results for input(s): AMMONIA in the last 168 hours. Coagulation Profile: No results for input(s): INR, PROTIME in the last 168 hours. Cardiac Enzymes: No results for input(s): CKTOTAL, CKMB, CKMBINDEX, TROPONINI in the last 168 hours. BNP (last 3 results) No results for input(s): PROBNP in the last 8760 hours. HbA1C: No results for input(s): HGBA1C in the last 72 hours. CBG: No results for input(s): GLUCAP in the last 168 hours. Lipid Profile: No results for input(s): CHOL, HDL, LDLCALC, TRIG, CHOLHDL, LDLDIRECT in the last 72 hours. Thyroid Function Tests: No results for input(s): TSH, T4TOTAL, FREET4, T3FREE, THYROIDAB in the last 72 hours. Anemia Panel: No results for input(s): VITAMINB12, FOLATE, FERRITIN, TIBC, IRON, RETICCTPCT in the last 72 hours. Urine analysis: No results found for: COLORURINE, APPEARANCEUR, LABSPEC, PHURINE, GLUCOSEU, HGBUR, BILIRUBINUR, KETONESUR, PROTEINUR, UROBILINOGEN, NITRITE, LEUKOCYTESUR   Dace Denn M.D. [facility] 06/22/2018, 9:55 AM  Pager: [phone] Between 7am to 7pm - call Pager - [phone]  After 7pm go to [email or web address] - password [email or web address]  Call night coverage person covering after 7pm

## 2018-06-23 DIAGNOSIS — J9621 Acute and chronic respiratory failure with hypoxia: Secondary | ICD-10-CM

## 2018-06-23 DIAGNOSIS — J9622 Acute and chronic respiratory failure with hypercapnia: Secondary | ICD-10-CM

## 2018-06-23 LAB — CBC
HCT: 36.4 % (ref 36.0–46.0)
Hemoglobin: 10.4 g/dL — ABNORMAL LOW (ref 12.0–15.0)
MCH: 25.5 pg — ABNORMAL LOW (ref 26.0–34.0)
MCHC: 28.6 g/dL — ABNORMAL LOW (ref 30.0–36.0)
MCV: 89.2 fL (ref 80.0–100.0)
Platelets: 226 10*3/uL (ref 150–400)
RBC: 4.08 MIL/uL (ref 3.87–5.11)
RDW: 17.2 % — AB (ref 11.5–15.5)
WBC: 7 10*3/uL (ref 4.0–10.5)
nRBC: 0 % (ref 0.0–0.2)

## 2018-06-23 LAB — BASIC METABOLIC PANEL
Anion gap: 9 (ref 5–15)
BUN: 39 mg/dL — AB (ref 6–20)
CO2: 30 mmol/L (ref 22–32)
CREATININE: 1.3 mg/dL — AB (ref 0.44–1.00)
Calcium: 8.8 mg/dL — ABNORMAL LOW (ref 8.9–10.3)
Chloride: 101 mmol/L (ref 98–111)
GFR calc Af Amer: 52 mL/min — ABNORMAL LOW (ref >60)
GFR calc non Af Amer: 45 mL/min — ABNORMAL LOW (ref >60)
Glucose, Bld: 130 mg/dL — ABNORMAL HIGH (ref 70–99)
Potassium: 5 mmol/L (ref 3.5–5.1)
Sodium: 140 mmol/L (ref 135–145)

## 2018-06-23 LAB — GLUCOSE, CAPILLARY
Glucose-Capillary: 123 mg/dL — ABNORMAL HIGH (ref 70–99)
Glucose-Capillary: 87 mg/dL (ref 70–99)
Glucose-Capillary: 86 mg/dL (ref 70–99)
Glucose-Capillary: 82 mg/dL (ref 70–99)
Glucose-Capillary: 78 mg/dL (ref 70–99)

## 2018-06-23 MED ORDER — SODIUM CHLORIDE 0.9 % IV BOLUS
500.0000 mL | Freq: Once | INTRAVENOUS | Status: AC
  Administered 2018-06-23: 500 mL via INTRAVENOUS

## 2018-06-23 MED ORDER — MUPIROCIN 2 % EX OINT
1.0000 "application " | TOPICAL_OINTMENT | Freq: Two times a day (BID) | CUTANEOUS | Status: AC
  Administered 2018-06-23 – 2018-06-27 (×9): 1 via NASAL
  Filled 2018-06-23 (×4): qty 22

## 2018-06-23 MED ORDER — ENOXAPARIN SODIUM 30 MG/0.3ML ~~LOC~~ SOLN
30.0000 mg | SUBCUTANEOUS | Status: DC
  Administered 2018-06-24 – 2018-07-04 (×11): 30 mg via SUBCUTANEOUS
  Filled 2018-06-23 (×11): qty 0.3

## 2018-06-23 MED ORDER — LEVOFLOXACIN IN D5W 500 MG/100ML IV SOLN
500.0000 mg | INTRAVENOUS | Status: AC
  Administered 2018-06-24 – 2018-06-26 (×3): 500 mg via INTRAVENOUS
  Filled 2018-06-23 (×3): qty 100

## 2018-06-23 MED ORDER — CHLORHEXIDINE GLUCONATE CLOTH 2 % EX PADS
6.0000 | MEDICATED_PAD | Freq: Every day | CUTANEOUS | Status: AC
  Administered 2018-06-23 – 2018-06-27 (×4): 6 via TOPICAL

## 2018-06-23 MED ORDER — ASPIRIN 81 MG PO CHEW
81.0000 mg | CHEWABLE_TABLET | Freq: Every day | ORAL | Status: DC
  Administered 2018-06-23 – 2018-07-04 (×11): 81 mg via ORAL
  Filled 2018-06-23 (×11): qty 1

## 2018-06-23 NOTE — Plan of Care (Signed)
  Problem: Clinical Measurements: Goal: Will remain free from infection Outcome: Progressing Goal: Diagnostic test results will improve Outcome: Progressing Goal: Respiratory complications will improve Outcome: Progressing Goal: Cardiovascular complication will be avoided Outcome: Progressing   Problem: Coping: Goal: Level of anxiety will decrease Outcome: Progressing   Problem: Elimination: Goal: Will not experience complications related to bowel motility Outcome: Progressing   Problem: Pain Managment: Goal: General experience of comfort will improve Outcome: Progressing   Problem: Safety: Goal: Ability to remain free from injury will improve Outcome: Progressing   Problem: Skin Integrity: Goal: Risk for impaired skin integrity will decrease Outcome: Progressing   Problem: Education: Goal: Knowledge of General Education information will improve Description Including pain rating scale, medication(s)/side effects and non-pharmacologic comfort measures Outcome: Not Progressing   Problem: Health Behavior/Discharge Planning: Goal: Ability to manage health-related needs will improve Outcome: Not Progressing   Problem: Clinical Measurements: Goal: Ability to maintain clinical measurements within normal limits will improve Outcome: Not Progressing   Problem: Activity: Goal: Risk for activity intolerance will decrease Outcome: Not Progressing   Problem: Nutrition: Goal: Adequate nutrition will be maintained Outcome: Not Progressing   Problem: Elimination: Goal: Will not experience complications related to urinary retention Outcome: Not Progressing

## 2018-06-23 NOTE — Progress Notes (Signed)
Initial Nutrition Assessment  DOCUMENTATION CODES:   Severe malnutrition in context of chronic illness  INTERVENTION:   If pt to remain intubated, recommend the following TF regimen: Osmolite 1.2 at 50 mL/hr - provides 1440 kcal, 67 g protein, and 984 mL free water - recommend additional 160 mL water flushes q 12 hours to better meet hydration needs   NUTRITION DIAGNOSIS:   Severe Malnutrition related to chronic illness as evidenced by energy intake < 75% for > or equal to 3 months, severe fat depletion, severe muscle depletion.  GOAL:   Patient will meet greater than or equal to 90% of their needs  MONITOR:   Diet advancement, Vent status, Labs, Weight trends  REASON FOR ASSESSMENT:   Ventilator    ASSESSMENT:   62 yo female, admitted with acute exacerbation of COPD. PMH significant for chronic hypoxic and hypercarbic respiratory failure, chronic diastolic CHF, drug abuse, tobacco use, HTN, depression with anxiety. Intubated 12/7.  Labs: glucose 130, BUN 39, Creatinine 1.30, GFR 45%/52%, Hgb 10.4 Meds: Protonix 40 mg daily, NS injection 3 mL q 12 hrs, Levophed, Propofol  Patient is currently intubated on ventilator support MV: 11.4 L/min Temp (24hrs), Avg:99 F (37.2 C), Min:98.1 F (36.7 C), Max:99.9 F (37.7 C)  Propofol: 2.6 ml/hr Levophed: 7.5 mcg/min  Pt awake with family present at time of visit.   Per chart, wt relatively stable over past 1 year. However, per results of NFPE, pt is severely malnourished.  Discussed possibility of started tube feeding if pt to remain intubated. Pt nodded her head eagerly.  NUTRITION - FOCUSED PHYSICAL EXAM:   Most Recent Value  Orbital Region  Severe depletion  Upper Arm Region  Severe depletion  Thoracic and Lumbar Region  Severe depletion  Buccal Region  Severe depletion  Temple Region  Severe depletion  Clavicle Bone Region  Severe depletion  Clavicle and Acromion Bone Region  Severe depletion  Scapular Bone  Region  Severe depletion  Dorsal Hand  Severe depletion  Patellar Region  Severe depletion  Anterior Thigh Region  Severe depletion  Posterior Calf Region  Severe depletion  Edema (RD Assessment)  None  Hair  Reviewed  Eyes  Reviewed  Mouth  Unable to assess  Skin  Reviewed  Nails  Reviewed      Diet Order:   Diet Order            Diet NPO time specified  Diet effective now              EDUCATION NEEDS:  No education needs have been identified at this time  Skin:  Skin Assessment: Reviewed RN Assessment  Last BM:  PTA  Height:  Ht Readings from Last 1 Encounters:  06/22/18 5\' 2"  (1.575 m)    Weight:  Wt Readings from Last 1 Encounters:  06/22/18 42.6 kg    Ideal Body Weight:  50 kg  BMI:  Body mass index is 17.19 kg/m.  Estimated Nutritional Needs:   Kcal:  1349 calories daily (PSU 2003b)  Protein:  64-77 gm daily (1.5-1.8 g/kg ABW)  Fluid:  >/= 1.3 L daily or per MD discretion  Jolaine ArtistHannah Greysen Swanton, MS, RDN, LDN Pager: [phone]

## 2018-06-23 NOTE — Progress Notes (Addendum)
Daily Progress Note   Patient Name: Vanessa Wallace       Date: 06/23/2018 DOB: 07-01-1956  Age: 62 y.o. MRN#: [id number] Attending Physician: Margaretha Seeds, MD Primary Care Physician: Kateri Mc, MD Admit Date: 06/21/2018  Reason for Consultation/Follow-up: Establishing goals of care  Subjective: Examined patient and spoke with Sister Luna Glasgow [phone]) at bedside.  Later son, Herbie Baltimore, arrived from [place] confirmed that the patient did not want to be put on a ventilator and did not want life support.  Approximately 3 months ago Carol called [name] and told her she was dying.   Jeanette observed [name]'s discomfort on the vent - biting the tube, and bucking.  [name]'s hands had to be restrained.  [name] explained that [name] are estranged and that [name] did not want Herbie Baltimore making decisions for her.  [name] and I spoke about [name]'s tobacco and cocaine use.  [name] stated that [name] went way down after her husband died 4 years ago.    Then [name] talked a good bit about her daughter who died 1 year ago - she was a family practice medical doctor who died of breast cancer with 5 young children.  This situation is causing [name] to reflect a great deal.  Herbie Baltimore arrived and saw his mother briefly.  The 3 of [place] then talked in the conference room.  He asked "What are my mother's chances".  I explained that she has been sick with severe emphysema for a long time and that her lungs are not able to work effectively.  If she was extubated today and we allowed her body to do what it naturally would - she would probably only have a few days at best.   This news was shocking to Duncan and he became tearful and would not speak.  When asked  questions Herbie Baltimore would point to [place] to answer.  Herbie Baltimore and [place] talked about [place]'s weight loss.  Approximately 1 year ago [name] weighed 165.  Today she weighs maybe 100 lbs.    We discussed the fact that [name] did not want intubation and life support.  Herbie Baltimore was surprised but believed his Desmond Dike.  We discussed her current code status and decided that if she were to arrest we would not attempt resuscitation  again.  [name] and Herbie Baltimore were in agreement.     [name] brought up nutrition and wanted [name] to have a tube placed for feeding.  I explained that sometimes extra fluid makes it more difficult to breath and that the critical care doctors would be able to determine whether or not she could handle feedings.  [name] was concerned her sister was hungry.  The family expressed the need to talk with the patient's other sisters and extended family before extubating her.    Patient's bedside RN, Lilia Pro, joined our conversation and answered questions from the family.  Lilia Pro expressed that [name] was very uncomfortable and urged [name] and Herbie Baltimore to have their conversations quickly for [name]'s benefit.    I verified with Herbie Baltimore that he wanted [name] to answer our questions and give direction to the medical team.  I committed to talk with [name] again tomorrow.  Assessment: 63 yo cachectic female with recent hx of cocaine and tobacco use suffering with recurrent hypercapnic respiratory failure - now bucking the vent, sedated, and in restraints.   Patient Profile/HPI: 62 y.o. female  with past medical history of COPD, D-HF, tobacco and cocaine abuse, anxiety, depression and arthritis who was admitted on 06/21/2018 with shortness of breath.  She was found to be in hypercapnic respiratory failure and required BiPAP. Unfortunately the patient has been not been tolerating the BiPAP well and has pulled it off.  When she does her oxygen saturation plummets into the 70s.  Patient  had a similar admission in Sept 2019.  Her code status at that time was DNR.   Her echocardiogram in September showed pulmonary artery arterial pressure of 67 mmhg with diastolic HF grade 1 and an EF of 55%.  She saw Dr. Vaughan Browner outpatient on 06/17/2018.  She has had an abnormal CT with biapical soft tissue densities and is due for a follow up PET scan for further evaluation.  Dr. Vaughan Browner felt her severe anxiety may prevent her from tolerating a PET scan.   He recommended Palliative discussions be continued.    Length of Stay: 1  Current Medications: Scheduled Meds:  . aspirin EC  81 mg Oral Daily  . chlorhexidine gluconate (MEDLINE KIT)  15 mL Mouth Rinse BID  . Chlorhexidine Gluconate Cloth  6 each Topical Q0600  . DULoxetine  60 mg Oral Daily  . [START ON 06/24/2018] enoxaparin (LOVENOX) injection  30 mg Subcutaneous Q24H  . ipratropium-albuterol  3 mL Nebulization TID  . mouth rinse  15 mL Mouth Rinse 10 times per day  . methylPREDNISolone (SOLU-MEDROL) injection  60 mg Intravenous Q6H  . mupirocin ointment  1 application Nasal BID  . nicotine  21 mg Transdermal Daily  . pantoprazole sodium  40 mg Per Tube Daily  . sodium chloride flush  3 mL Intravenous Q12H  . sodium chloride flush  3 mL Intravenous Q12H    Continuous Infusions: . sodium chloride    . [START ON 06/24/2018] levofloxacin (LEVAQUIN) IV    . norepinephrine (LEVOPHED) Adult infusion 3 mcg/min (06/23/18 4742)  . propofol (DIPRIVAN) infusion Stopped (06/23/18 0914)    PRN Meds: sodium chloride, acetaminophen **OR** acetaminophen, albuterol, fentaNYL (SUBLIMAZE) injection, ondansetron **OR** ondansetron (ZOFRAN) IV, sodium chloride flush  Physical Exam        Cachectically thin female, awake, biting the ET tube, throwing her head back and chest out. CV tachy Resp increased work of breathing (fight) Abdomen thin, ND, NT  Vital Signs: BP 112/88   Pulse 83  Temp 98.1 F (36.7 C) (Axillary)   Resp (!) 24   Ht 5'  2" (1.575 m)   Wt 42.6 kg   SpO2 97%   BMI 17.19 kg/m  SpO2: SpO2: 97 % O2 Device: O2 Device: Ventilator O2 Flow Rate: O2 Flow Rate (L/min): 10 L/min  Intake/output summary:   Intake/Output Summary (Last 24 hours) at 06/23/2018 1138 Last data filed at 06/23/2018 2518 Gross per 24 hour  Intake 2357.86 ml  Output 0 ml  Net 2357.86 ml   LBM: Last BM Date: (PTA) Baseline Weight: Weight: 47 kg Most recent weight: Weight: 42.6 kg       Palliative Assessment/Data: 20%      Patient Active Problem List   Diagnosis Date Noted  . COPD with acute exacerbation ([place]) 06/22/2018  . Anxiety 06/22/2018  . Transaminasemia 06/22/2018  . Essential hypertension   . Acute respiratory failure with hypercapnia ([place])   . Polysubstance abuse ([place])   . Palliative care encounter   . Fluid overload 03/25/2018  . Protein-calorie malnutrition, severe 08/12/2017  . Protein-calorie malnutrition, moderate ([place]) 08/08/2017  . Iron deficiency anemia 08/08/2017  . Depression 08/08/2017  . Acute on chronic respiratory failure with hypoxia and hypercapnia ([place]) 08/08/2017  . COPD exacerbation ([place]) 08/30/2016  . COPD (chronic obstructive pulmonary disease) ([place]) 08/30/2016  . Asthma exacerbation 10/02/2014  . Renal insufficiency   . Chest wall pain   . Cough   . Cocaine use 09/22/2013  . Pneumonia 09/17/2013  . Community acquired pneumonia 09/17/2013    Palliative Care Plan    Recommendations/Plan:  Family requests patient be sedated and made comfortable on the vent - giving them a chance to speak with other family members.  Code status changed to DNR.  Do not resuscitate, do not reintubate  PMT to speak with family [name]) again tomorrow.    Son Herbie Baltimore defers decisions to [place].  Goals of Care and Additional Recommendations:  Limitations on Scope of Treatment: Full Scope Treatment  Code Status:  DNR - do not reintubate  Prognosis:   < 2 weeks secondary to hypercapnic resp  failure.   Discharge Planning:  Anticipated Hospital Death  Care plan was discussed with bedside RN, Family.  Thank you for allowing the Palliative Medicine Team to assist in the care of this patient.  Total time spent:  90 min. Time in 1:00 Time out: 2:30     Greater than 50%  of this time was spent counseling and coordinating care related to the above assessment and plan.  Florentina Jenny, PA-C Palliative Medicine  Please contact Palliative MedicineTeam phone at [phone] for questions and concerns between 7 am - 7 pm.   Please see AMION for individual provider pager numbers.

## 2018-06-23 NOTE — Progress Notes (Signed)
Patient has not urinated this shift despite 500 cc bolus.  Bladder Scan of patient showed a volume of 445.   Cr. 1.30 per morning labs.  Elink nurse aware.  Will continue to monitor.

## 2018-06-23 NOTE — Progress Notes (Signed)
eLink Physician-Brief Progress Note Patient Name: Vanessa FordVivian A Wallace DOB: 10/11/1955 MRN: [id number]   Date of Service  06/23/2018  HPI/Events of Note  Oliguria - Bladder scan with 300 mL. Remains on Norepinephrine IV infusion at 7 mcg/min.   eICU Interventions  Will order: 1. Bolus with 0.9 NaCl 500 mL IV over 30 minutes now.      Intervention Category Intermediate Interventions: Oliguria - evaluation and management  Miryah Ralls [place] 06/23/2018, 1:55 AM

## 2018-06-23 NOTE — Progress Notes (Addendum)
NAME:  Vanessa Wallace, MRN:  [id number], DOB:  December 20, 1955, LOS: 1 ADMISSION DATE:  06/21/2018, CONSULTATION DATE:  12/819 REFERRING MD:  Dr. Isidoro Donning, MD CHIEF COMPLAINT:  Altered mental status, respiratory acidosis  Brief History    62 year old female with severe emphysema with chronic hypercapnic and hypoxemic respiratory failure transferred from stepdown unit for persistent respiratory acidosis and worsening mental status refractory to BiPAP.  Transferred to PCCM for management of acute on chronic hypercapnic and hypoxic respiratory failure requiring intubation.  Past Medical History  COPD Chronic hypoxemic and hypercapneic respiratory failure  Chronic diastolic heart failure  Significant Hospital Events   12/7 Admitted 12/8 PCCM  Consults:  PCCM > 12/8  Procedures:  ETT 12/8 >   Significant Diagnostic Tests:  ABG 12/7 00:37 7.3/69/123 ABG 12/7 09:00 7.26/81/103  Micro Data:  BCx 12/7 > Sputum >  Antimicrobials:  Levaquin 12/8 >  Interim history/subjective:    Objective   Blood pressure (!) 159/103, pulse 84, temperature 98.1 F (36.7 C), temperature source Axillary, resp. rate (!) 25, height 5\' 2"  (1.575 m), weight 42.6 kg, SpO2 100 %.    Vent Mode: PRVC FiO2 (%):  [40 %-50 %] 40 % Set Rate:  [25 bmp] 25 bmp Vt Set:  [400 mL] 400 mL PEEP:  [5 cmH20] 5 cmH20 Plateau Pressure:  [21 cmH20-23 cmH20] 21 cmH20   Intake/Output Summary (Last 24 hours) at 06/23/2018 0838 Last data filed at 06/23/2018 0600 Gross per 24 hour  Intake 2249.68 ml  Output 0 ml  Net 2249.68 ml   Filed Weights   06/21/18 2329 06/22/18 0500  Weight: 47 kg 42.6 kg    Examination: General: seated elderly female on vent HENT: [place]/AT, PERRL, no JVD Lungs: No wheeze. Clear, vent assisted breaths.  Cardiovascular: RRR, no m/g/r Abdomen: Soft, non-tender, non-distended Extremities: No LE edema, moves extremities x 4 Neuro: Sedated. RASS -4 GU: Purewick, no output, but 500cc on bladder scan.    Resolved Hospital Problem list     Assessment & Plan:   Acute on chronic hypercapneic and hypoxemic respiratory failure Severe Emphysema - Full vent support - PSV wean today once WUA initiated.  - Chance she could extubate today - Continue IV steroids, levaquin and scheduled nebulizers - Holding Breo, and incruse.  - Levaquin   Altered mental status due to metabolic encephalopathy. Likely related to hypercapneia. - Wean propofol for WUA.   Chronic diastolic heart failure (grade 1). LVEF 50-55% . Euvolemic on exam, but oliguric.  - Lasix held  - Fluid challenge.   Anxiety -Hold home klonopin for AMS  Hypotension: likely related to sedatives. Hypertensive at baseline.  - Continue levophed PRN to keep MAP > . Currently weaned down to .   Goals of Care : Previously DNR.  -Palliative Care consult - family meeting planned today.   Best practice:  Diet: NPO Pain/Anxiety/Delirium protocol (if indicated): Propofol for RASS goal 0 to -1.  VAP protocol (if indicated): yes DVT prophylaxis: Lovenox GI prophylaxis: PPI Glucose control: None Mobility: Bed rest Code Status: Full Code Family Communication: Palliative meeting planned for today.  Disposition: ICU  Labs   CBC: Recent Labs  Lab 06/21/18 2339 06/22/18 0419 06/23/18 0419  WBC 7.0 6.9 7.0  NEUTROABS 5.5 6.4  --   HGB 12.3 11.3* 10.4*  HCT 43.7 39.0 36.4  MCV 93.6 91.5 89.2  PLT 229 216 226    Basic Metabolic Panel: Recent Labs  Lab 06/21/18 2339 06/22/18 0419 06/23/18 0419  NA  143 142 140  K 4.2 4.2 5.0  CL 99 99 101  CO2 32 32 30  GLUCOSE 94 105* 130*  BUN 17 19 39*  CREATININE 0.67 0.72 1.30*  CALCIUM 9.3 8.9 8.8*   GFR: Estimated Creatinine Clearance: 30.9 mL/min (A) (by C-G formula based on SCr of 1.3 mg/dL (H)). Recent Labs  Lab 06/21/18 2339 06/22/18 0419 06/23/18 0419  WBC 7.0 6.9 7.0    Liver Function Tests: Recent Labs  Lab 06/21/18 2339  AST 62*  ALT 46*  ALKPHOS  66  BILITOT 0.7  PROT 8.1  ALBUMIN 3.7   No results for input(s): LIPASE, AMYLASE in the last 168 hours. No results for input(s): AMMONIA in the last 168 hours.  ABG    Component Value Date/Time   PHART 7.338 (L) 06/22/2018 1401   PCO2ART 66.3 (HH) 06/22/2018 1401   PO2ART 83.0 06/22/2018 1401   HCO3 35.6 (H) 06/22/2018 1401   TCO2 38 (H) 06/22/2018 1401   ACIDBASEDEF 2.0 08/08/2017 0512   O2SAT 95.0 06/22/2018 1401     Coagulation Profile: No results for input(s): INR, PROTIME in the last 168 hours.  Cardiac Enzymes: No results for input(s): CKTOTAL, CKMB, CKMBINDEX, TROPONINI in the last 168 hours.  HbA1C: Hgb A1c MFr Bld  Date/Time Value Ref Range Status  08/30/2016 06:46 PM 5.5 4.8 - 5.6 % Final    Comment:    (NOTE)         Pre-diabetes: 5.7 - 6.4         Diabetes: >6.4         Glycemic control for adults with diabetes: <7.0     CBG: Recent Labs  Lab 06/22/18 1638 06/22/18 1957 06/22/18 2350 06/23/18 0418 06/23/18 0809  GLUCAP 85 113* 127* 123* 87    Review of Systems:   Unable to obtain due to mental status  Past Medical History  She,  has a past medical history of Anxiety, Arthritis, Asthma, CAP (community acquired pneumonia) (09/17/2013), Chronic bronchitis (HCC), Cocaine abuse (HCC), COPD (chronic obstructive pulmonary disease) (HCC), Hypertension, and Migraines.   Surgical History    Past Surgical History:  Procedure Laterality Date  . ABDOMINAL HYSTERECTOMY     "partial"  . BUNIONECTOMY Bilateral    "right one came back after OR" (09/17/2013)  . LACERATION REPAIR Left 06/1999   Repair FPL, radial digital nerve, intrinsics, left thumb./notes 06/29/1999 (09/17/2013)  . TUBAL LIGATION       Social History   reports that she has been smoking cigarettes. She has a 12.00 pack-year smoking history. She has never used smokeless tobacco. She reports that she has current or past drug history. Drugs: Marijuana and Cocaine. She reports that she does not  drink alcohol.   Family History   Her family history includes Brain cancer in her mother.   Allergies No Known Allergies   Home Medications  Prior to Admission medications   Medication Sig Start Date End Date Taking? Authorizing Provider  albuterol (PROVENTIL HFA;VENTOLIN HFA) 108 (90 Base) MCG/ACT inhaler Inhale 1-2 puffs into the lungs every 6 (six) hours as needed for wheezing or shortness of breath. 08/15/17   Penny PiaVega, [place], MD  albuterol (PROVENTIL) (2.5 MG/3ML) 0.083% nebulizer solution Take 2.5 mg by nebulization every 6 (six) hours as needed for wheezing or shortness of breath.     [provider]  amLODipine (NORVASC) 5 MG tablet Take 1 tablet (5 mg total) by mouth daily. 03/30/18   Delano MetzSchertz, Robert, MD  aspirin 81  MG tablet Take 81 mg by mouth daily.    [provider]  clonazePAM (KLONOPIN) 0.5 MG tablet Take 1 tablet by mouth 2 (two) times daily as needed for anxiety. 01/31/18   [provider]  DULoxetine (CYMBALTA) 60 MG capsule Take 60 mg by mouth daily. 07/22/17   [provider]  ferrous sulfate 325 (65 FE) MG tablet Take 1 tablet (325 mg total) by mouth daily with breakfast. 09/01/16   [name], MD  Fluticasone-Umeclidin-Vilant (TRELEGY ELLIPTA) 100-62.5-25 MCG/INH AEPB Inhale 1 puff into the lungs daily.    [provider]  furosemide (LASIX) 20 MG tablet Take 1 tablet (20 mg total) by mouth daily. 03/30/18   Delano Metz, MD  ipratropium-albuterol (DUONEB) 0.5-2.5 (3) MG/3ML SOLN Take 3 mLs by nebulization every 6 (six) hours as needed. J44.9 06/17/18   Mannam, Colbert Coyer, MD  lisinopril (PRINIVIL,ZESTRIL) 20 MG tablet Take 1 tablet (20 mg total) by mouth daily. 03/29/18 03/29/19  Delano Metz, MD  LORazepam (ATIVAN) 0.5 MG tablet Take 1 tablet (0.5 mg total) by mouth every 8 (eight) hours as needed for up to 30 doses for anxiety. 03/29/18   Delano Metz, MD  montelukast (SINGULAIR) 10 MG tablet Take 10 mg by mouth at  bedtime.    [provider]  omeprazole (PRILOSEC) 20 MG capsule Take 20 mg by mouth daily.    [provider]  predniSONE (DELTASONE) 10 MG tablet 4 tabs x 3 days, 3 tabs x 3 days, 2 tab x 3 days, 1 tab x 3 days 06/17/18   Chilton Greathouse, MD     Critical care time: [address]      Joneen Roach, AGACNP-BC [facility] Pulmonary/Critical Care Pager [phone] or [phone]  06/23/2018 8:49 AM

## 2018-06-23 NOTE — Progress Notes (Signed)
Patient has not urinated this shift.  Bladder scan of the patient showed a volume of 347.  Called [facility] and nurse is aware.  Will continue to monitor.

## 2018-06-24 ENCOUNTER — Inpatient Hospital Stay (HOSPITAL_COMMUNITY): Payer: Medicaid Other

## 2018-06-24 LAB — GLUCOSE, CAPILLARY
Glucose-Capillary: 104 mg/dL — ABNORMAL HIGH (ref 70–99)
Glucose-Capillary: 112 mg/dL — ABNORMAL HIGH (ref 70–99)
Glucose-Capillary: 72 mg/dL (ref 70–99)
Glucose-Capillary: 75 mg/dL (ref 70–99)
Glucose-Capillary: 83 mg/dL (ref 70–99)
Glucose-Capillary: 97 mg/dL (ref 70–99)

## 2018-06-24 LAB — BASIC METABOLIC PANEL
Anion gap: 7 (ref 5–15)
BUN: 40 mg/dL — ABNORMAL HIGH (ref 6–20)
CHLORIDE: 100 mmol/L (ref 98–111)
CO2: 34 mmol/L — ABNORMAL HIGH (ref 22–32)
CREATININE: 0.89 mg/dL (ref 0.44–1.00)
Calcium: 9.3 mg/dL (ref 8.9–10.3)
GFR calc Af Amer: >60 mL/min (ref >60)
GFR calc non Af Amer: >60 mL/min (ref >60)
Glucose, Bld: 98 mg/dL (ref 70–99)
Potassium: 5.1 mmol/L (ref 3.5–5.1)
Sodium: 141 mmol/L (ref 135–145)

## 2018-06-24 LAB — POCT I-STAT 3, ART BLOOD GAS (G3+)
Acid-Base Excess: 8 mmol/L — ABNORMAL HIGH (ref 0.0–2.0)
Bicarbonate: 39.4 mmol/L — ABNORMAL HIGH (ref 20.0–28.0)
O2 Saturation: 97 %
PCO2 ART: 92.7 mmHg — AB (ref 32.0–48.0)
PO2 ART: 106 mmHg (ref 83.0–108.0)
Patient temperature: 97.6
TCO2: 42 mmol/L — ABNORMAL HIGH (ref 22–32)
pH, Arterial: 7.234 — ABNORMAL LOW (ref 7.350–7.450)

## 2018-06-24 LAB — CBC
HCT: 38.2 % (ref 36.0–46.0)
Hemoglobin: 10.8 g/dL — ABNORMAL LOW (ref 12.0–15.0)
MCH: 25.4 pg — ABNORMAL LOW (ref 26.0–34.0)
MCHC: 28.3 g/dL — ABNORMAL LOW (ref 30.0–36.0)
MCV: 89.9 fL (ref 80.0–100.0)
Platelets: 166 10*3/uL (ref 150–400)
RBC: 4.25 MIL/uL (ref 3.87–5.11)
RDW: 17.2 % — ABNORMAL HIGH (ref 11.5–15.5)
WBC: 4.9 10*3/uL (ref 4.0–10.5)
nRBC: 0 % (ref 0.0–0.2)

## 2018-06-24 LAB — PHOSPHORUS: Phosphorus: 4.3 mg/dL (ref 2.5–4.6)

## 2018-06-24 LAB — MAGNESIUM: Magnesium: 2.5 mg/dL — ABNORMAL HIGH (ref 1.7–2.4)

## 2018-06-24 MED ORDER — HYDRALAZINE HCL 20 MG/ML IJ SOLN
10.0000 mg | INTRAMUSCULAR | Status: DC | PRN
  Administered 2018-06-24: 10 mg via INTRAVENOUS
  Filled 2018-06-24: qty 1

## 2018-06-24 MED ORDER — AMLODIPINE BESYLATE 5 MG PO TABS
5.0000 mg | ORAL_TABLET | Freq: Every day | ORAL | Status: DC
  Administered 2018-06-24: 5 mg via ORAL
  Filled 2018-06-24: qty 1

## 2018-06-24 MED ORDER — HYDRALAZINE HCL 20 MG/ML IJ SOLN
10.0000 mg | INTRAMUSCULAR | Status: DC | PRN
  Administered 2018-06-24: 20 mg via INTRAVENOUS
  Administered 2018-06-24 – 2018-06-26 (×2): 10 mg via INTRAVENOUS
  Administered 2018-06-27 (×2): 20 mg via INTRAVENOUS
  Administered 2018-06-27: 15 mg via INTRAVENOUS
  Administered 2018-06-27: 20 mg via INTRAVENOUS
  Filled 2018-06-24 (×6): qty 1

## 2018-06-24 MED ORDER — LISINOPRIL 20 MG PO TABS: 20.0000 mg | ORAL_TABLET | Freq: Every day | ORAL | Status: DC

## 2018-06-24 MED ORDER — CLONAZEPAM 0.5 MG PO TBDP
0.5000 mg | ORAL_TABLET | Freq: Two times a day (BID) | ORAL | Status: DC
  Administered 2018-06-24: 0.5 mg via ORAL
  Filled 2018-06-24: qty 1

## 2018-06-24 MED ORDER — METHYLPREDNISOLONE SODIUM SUCC 125 MG IJ SOLR
60.0000 mg | Freq: Two times a day (BID) | INTRAMUSCULAR | Status: DC
  Administered 2018-06-24 – 2018-06-26 (×4): 60 mg via INTRAVENOUS
  Filled 2018-06-24 (×4): qty 2

## 2018-06-24 MED ORDER — DEXTROSE-NACL 5-0.45 % IV SOLN
INTRAVENOUS | Status: DC
  Administered 2018-06-24 – 2018-06-25 (×2): via INTRAVENOUS
  Administered 2018-06-26: 950 mL via INTRAVENOUS

## 2018-06-24 MED ORDER — FENTANYL CITRATE (PF) 100 MCG/2ML IJ SOLN
12.5000 ug | Freq: Once | INTRAMUSCULAR | Status: AC
  Administered 2018-06-24: 12.5 ug via INTRAVENOUS

## 2018-06-24 MED ORDER — FUROSEMIDE 10 MG/ML IJ SOLN
20.0000 mg | Freq: Every day | INTRAMUSCULAR | Status: DC
  Administered 2018-06-24 – 2018-06-26 (×3): 20 mg via INTRAVENOUS
  Filled 2018-06-24 (×4): qty 2

## 2018-06-24 MED ORDER — HYDRALAZINE HCL 20 MG/ML IJ SOLN
INTRAMUSCULAR | Status: AC
  Administered 2018-06-24: 10 mg via INTRAVENOUS
  Filled 2018-06-24: qty 1

## 2018-06-24 MED ORDER — LORAZEPAM 2 MG/ML IJ SOLN
0.5000 mg | Freq: Once | INTRAMUSCULAR | Status: AC
  Administered 2018-06-24: 0.5 mg via INTRAVENOUS
  Filled 2018-06-24: qty 1

## 2018-06-24 NOTE — Progress Notes (Signed)
PCCM INTERVAL PROGRESS NOTE  WOB increase and hypertension  Plan: PRN BiPAP if needed. Currently on increased fio2. Target SpO2 88-94% CXR STAT Will add PRN hydralazine and add back home lisinopril.    Vanessa RoachPaul Selby Slovacek, AGACNP-BC [facility] Pulmonary/Critical Care Pager [phone] or [phone]  06/24/2018 6:19 PM

## 2018-06-24 NOTE — Progress Notes (Addendum)
eLink Physician-Brief Progress Note Patient Name: Vanessa FordVivian A Wallace DOB: September 27, 1955 MRN: [id number]   Date of Service  06/24/2018  HPI/Events of Note  Pt extubated earlier.  She is slightly tachypneic, doing better on O2, and off BIPAP.  Pt complaining of anxiety. Pt on klonopin but has not been taking PO.  eICU Interventions  Ativan IV ordered.     Intervention Category Minor Interventions: Agitation / anxiety - evaluation and management  Larinda ButteryVanessa Zion Lint 06/24/2018, 9:18 PM    10:55 PM Pt resting better after 1 dose of Ativan but still has increased work of breathing.  Pt is also tachycardic.  Hypertension responded with hydralazine.   Plan> Check ABG.   Give Fentanyl 12.385mcg x 1.  11:39 PM ABG 7.234/92.7/106.  Plan> Place on BIPAP. Recheck ABG in 1 hour.   Discussed with family at the bedside including her son ABG results.  Informed the family the possibility of needing reintubation if she does not improve with BIPAP.  1:26 AM  Repeat ABG 7.26/87.3/80 Pt responds appropriately and is able to follow simple commands.   Given slight improvement, continue on BIPAP.  Start on precedex if she becomes agitated.    4:31 AM  Reevaluated patient at the bedside.  She is on precedex and is more comfortable.   Repeat ABG is improved at 7.30/77.3/76.  Trial off BIPAP in the AM.

## 2018-06-24 NOTE — Evaluation (Addendum)
Clinical/Bedside Swallow Evaluation Patient Details  Name: Vanessa Wallace MRN: [id number] Date of Birth: [birth year]  Today's Date: 06/24/2018 Time: SLP Start Time (ACUTE ONLY): 1500 SLP Stop Time (ACUTE ONLY): 1530 SLP Time Calculation (min) (ACUTE ONLY): 30 min  Past Medical History:  Past Medical History:  Diagnosis Date  . Anxiety   . Arthritis    "all over" (09/17/2013)  . Asthma   . CAP (community acquired pneumonia) 09/17/2013  . Chronic bronchitis (HCC)    "flares up w/my asthma" (09/17/2013)  . Cocaine abuse (HCC)   . COPD (chronic obstructive pulmonary disease) (HCC)   . Hypertension   . Migraines    "weekly lately" (09/17/2013)   Past Surgical History:  Past Surgical History:  Procedure Laterality Date  . ABDOMINAL HYSTERECTOMY     "partial"  . BUNIONECTOMY Bilateral    "right one came back after OR" (09/17/2013)  . LACERATION REPAIR Left 06/1999   Repair FPL, radial digital nerve, intrinsics, left thumb./notes 06/29/1999 (09/17/2013)  . TUBAL LIGATION     HPI:  62 y.o. female admitted 06/21/18 with SOB. PMH: COPD with chronic hypoxic and hypercarbic respiratory failure, chronic diastolic CHF, drug abuse, depression with anxiety, and hypertension   Assessment / Plan / Recommendation Clinical Impression  Pt was extubated today. RN reported to SLP that pt was requesting ice cream, and could pt be evaluated for po readiness. Pt was awake, alert, following commands. Voice quality clear. Oral care was completed with suction. Pt noted to have upper dentures, edentulous lower. Pt refused to remove upper denture to have it cleaned. Pt accepted trials of ice chips, thin liquids, puree, and solids. Pt passed the 3 oz water challenge without overt s/s aspiration. Extended oral prep was noted with solid consistency. Puree was tolerated without difficulty. Given recent extubation and increased aspiration risk, will begin conservative diet of dys 2 (finely chopped) and thin liquids. SLP will  follow to assess diet tolerance and provide education. RN informed.   SLP Visit Diagnosis: Dysphagia, unspecified (R13.10)    Aspiration Risk  Moderate aspiration risk;Mild aspiration risk    Diet Recommendation Dysphagia 2 (Fine chop);Thin liquid   Liquid Administration via: Straw;Cup Medication Administration: Whole meds with liquid Supervision: Staff to assist with self feeding;Full supervision/cueing for compensatory strategies Compensations: Minimize environmental distractions;Small sips/bites;Slow rate Postural Changes: Seated upright at 90 degrees;Remain upright for at least 30 minutes after po intake    Other  Recommendations Oral Care Recommendations: Oral care BID Other Recommendations: Have oral suction available   Follow up Recommendations (TBD)      Frequency and Duration min 2x/week  2 weeks;1 week       Prognosis Prognosis for Safe Diet Advancement: Good      Swallow Study   General Date of Onset: 06/22/18 HPI: 62 y.o. female admitted 06/21/18 with SOB. PMH: COPD with chronic hypoxic and hypercarbic respiratory failure, chronic diastolic CHF, drug abuse, depression with anxiety, and hypertension Type of Study: Bedside Swallow Evaluation Previous Swallow Assessment: none Diet Prior to this Study: NPO Temperature Spikes Noted: No Respiratory Status: Nasal cannula History of Recent Intubation: Yes Length of Intubations (days): 2 days Date extubated: 06/24/18 Behavior/Cognition: Alert Oral Cavity Assessment: Within Functional Limits Oral Care Completed by SLP: Yes Oral Cavity - Dentition: Dentures, top(pt refused to remove dentures for them to be cleaned) Vision: Functional for self-feeding Self-Feeding Abilities: Total assist Patient Positioning: Upright in bed Baseline Vocal Quality: Low vocal intensity Volitional Cough: Strong Volitional Swallow: Able to elicit  Oral/Motor/Sensory Function Overall Oral Motor/Sensory Function: Within functional limits    Ice Chips Ice chips: Within functional limits Presentation: Spoon   Thin Liquid Thin Liquid: Within functional limits Presentation: Straw    Nectar Thick Nectar Thick Liquid: Not tested   Honey Thick Honey Thick Liquid: Not tested   Puree Puree: Within functional limits Presentation: Spoon   Solid     Solid: Impaired Oral Phase Functional Implications: Prolonged oral transit      B. Murvin NatalBueche, [facility] HillsMSP, CCC-SLP Speech Language Pathologist [phone]  Leigh AuroraBueche,  [place] 06/24/2018,3:35 PM

## 2018-06-24 NOTE — Progress Notes (Signed)
RT note-Moderate work of breathing noted, RN notified.

## 2018-06-24 NOTE — Progress Notes (Signed)
Elink nurse notified about pt increased work of breathing, tachycardia, high BP and extreme anxiety.

## 2018-06-24 NOTE — Progress Notes (Signed)
NAME:  Vanessa FordVivian A Wallace, MRN:  [id number], DOB:  1956/03/02, LOS: 2 ADMISSION DATE:  06/21/2018, CONSULTATION DATE:  12/819 REFERRING MD:  Dr. Isidoro Donningai, MD CHIEF COMPLAINT:  Altered mental status, respiratory acidosis  Brief History    62 year old female with severe emphysema with chronic hypercapnic and hypoxemic respiratory failure transferred from stepdown unit for persistent respiratory acidosis and worsening mental status refractory to BiPAP.  Transferred to PCCM for management of acute on chronic hypercapnic and hypoxic respiratory failure requiring intubation.  Past Medical History  COPD, Chronic hypoxemic and hypercapneic respiratory failure, Chronic diastolic heart failure  Significant Hospital Events   12/7 Admitted 12/8 PCCM  Consults:  PCCM > 12/8  Procedures:  ETT 12/8 >   Significant Diagnostic Tests:  ABG 12/7 00:37 7.3/69/123 ABG 12/7 09:00 7.26/81/103  Micro Data:  BCx 12/7 > Sputum >  Antimicrobials:  Levaquin 12/8 >  Interim history/subjective:  PMT meeting yesterday determined DNR DNI, however, family this morning telling me that she may want to be intubated if fails extubation.  WUA and SBT ongoing.   Objective   Blood pressure (!) 161/103, pulse 76, temperature 98.2 F (36.8 C), temperature source Oral, resp. rate (!) 25, height 5\' 2"  (1.575 m), weight 42.6 kg, SpO2 100 %.    Vent Mode: PRVC FiO2 (%):  [40 %] 40 % Set Rate:  [25 bmp] 25 bmp Vt Set:  [400 mL] 400 mL PEEP:  [5 cmH20] 5 cmH20 Plateau Pressure:  [19 cmH20-31 cmH20] 27 cmH20   Intake/Output Summary (Last 24 hours) at 06/24/2018 1212 Last data filed at 06/24/2018 0900 Gross per 24 hour  Intake 437.62 ml  Output 950 ml  Net -512.38 ml   Filed Weights   06/21/18 2329 06/22/18 0500  Weight: 47 kg 42.6 kg    Examination: General: sedated elderly female on vent HENT: [place]/AT, PERRL, no JVD Lungs: Clear bilateral breath sounds  Cardiovascular: RRR, no m/g/r Abdomen: Soft, non-tender,  non-distended Extremities: no acute deformity Neuro: Sedated. RASS -1 GU: Purewick  Resolved Hospital Problem list     Assessment & Plan:   Acute on chronic hypercapneic and hypoxemic respiratory failure Severe Emphysema - Full vent support - WUA and SBT now - Hopefuls she can extubate - Continue IV steroids, levaquin and scheduled nebulizers. Will wean solumedrol to 40mg  q 12 hours.  - Holding Breo, and incruse.  - Levaquin   Altered mental status due to metabolic encephalopathy. Likely related to hypercapneia. - Wean propofol for WUA.   Chronic diastolic heart failure (grade 1). LVEF 50-55% . Euvolemic on exam, but oliguric.  - Restart home lasix.  - Daily BMP  Anxiety -Hold home klonopin for AMS  Hypotension: likely related to sedatives. Hypertensive at baseline. RESOLVED. Now somewhat hypertensive.  - Off pressors - Telemetry monitoring.  - Lasix as above  Goals of Care : Previously DNR.  -Palliative Care meeting with family determined patient to be DNR/DNI. I called family this morning to let them know we may extubate, and they indicated that we should re-intubate if she should fail extubation.  Best practice:  Diet: TF if not extubated Pain/Anxiety/Delirium protocol (if indicated): Propofol for RASS goal 0 to -1.  VAP protocol (if indicated): yes DVT prophylaxis: Lovenox GI prophylaxis: PPI Glucose control: None Mobility: Bed rest Code Status: DNR, but would want re-intubation if fails extubation.  Family Communication: Palliative meeting planned for today.  Disposition: ICU  Labs   CBC: Recent Labs  Lab 06/21/18 2339 06/22/18 0419 06/23/18  1610 06/24/18 0329  WBC 7.0 6.9 7.0 4.9  NEUTROABS 5.5 6.4  --   --   HGB 12.3 11.3* 10.4* 10.8*  HCT 43.7 39.0 36.4 38.2  MCV 93.6 91.5 89.2 89.9  PLT 229 216 226 166    Basic Metabolic Panel: Recent Labs  Lab 06/21/18 2339 06/22/18 0419 06/23/18 0419 06/24/18 0329  NA 143 142 140 141  K 4.2 4.2 5.0  5.1  CL 99 99 101 100  CO2 32 32 30 34*  GLUCOSE 94 105* 130* 98  BUN 17 19 39* 40*  CREATININE 0.67 0.72 1.30* 0.89  CALCIUM 9.3 8.9 8.8* 9.3  MG  --   --   --  2.5*  PHOS  --   --   --  4.3   GFR: Estimated Creatinine Clearance: 45.2 mL/min (by C-G formula based on SCr of 0.89 mg/dL). Recent Labs  Lab 06/21/18 2339 06/22/18 0419 06/23/18 0419 06/24/18 0329  WBC 7.0 6.9 7.0 4.9    Liver Function Tests: Recent Labs  Lab 06/21/18 2339  AST 62*  ALT 46*  ALKPHOS 66  BILITOT 0.7  PROT 8.1  ALBUMIN 3.7   No results for input(s): LIPASE, AMYLASE in the last 168 hours. No results for input(s): AMMONIA in the last 168 hours.  ABG    Component Value Date/Time   PHART 7.338 (L) 06/22/2018 1401   PCO2ART 66.3 (HH) 06/22/2018 1401   PO2ART 83.0 06/22/2018 1401   HCO3 35.6 (H) 06/22/2018 1401   TCO2 38 (H) 06/22/2018 1401   ACIDBASEDEF 2.0 08/08/2017 0512   O2SAT 95.0 06/22/2018 1401     Coagulation Profile: No results for input(s): INR, PROTIME in the last 168 hours.  Cardiac Enzymes: No results for input(s): CKTOTAL, CKMB, CKMBINDEX, TROPONINI in the last 168 hours.  HbA1C: Hgb A1c MFr Bld  Date/Time Value Ref Range Status  08/30/2016 06:46 PM 5.5 4.8 - 5.6 % Final    Comment:    (NOTE)         Pre-diabetes: 5.7 - 6.4         Diabetes: >6.4         Glycemic control for adults with diabetes: <7.0     CBG: Recent Labs  Lab 06/23/18 2034 06/24/18 0012 06/24/18 0347 06/24/18 0755 06/24/18 1152  GLUCAP 78 83 104* 75 72    Review of Systems:   Unable to obtain due to mental status  Past Medical History  She,  has a past medical history of Anxiety, Arthritis, Asthma, CAP (community acquired pneumonia) (09/17/2013), Chronic bronchitis (HCC), Cocaine abuse (HCC), COPD (chronic obstructive pulmonary disease) (HCC), Hypertension, and Migraines.   Surgical History    Past Surgical History:  Procedure Laterality Date  . ABDOMINAL HYSTERECTOMY      "partial"  . BUNIONECTOMY Bilateral    "right one came back after OR" (09/17/2013)  . LACERATION REPAIR Left 06/1999   Repair FPL, radial digital nerve, intrinsics, left thumb./notes 06/29/1999 (09/17/2013)  . TUBAL LIGATION       Social History   reports that she has been smoking cigarettes. She has a 12.00 pack-year smoking history. She has never used smokeless tobacco. She reports that she has current or past drug history. Drugs: Marijuana and Cocaine. She reports that she does not drink alcohol.   Family History   Her family history includes Brain cancer in her mother.   Allergies No Known Allergies   Home Medications  Prior to Admission medications   Medication Sig Start  Date End Date Taking? Authorizing Provider  albuterol (PROVENTIL HFA;VENTOLIN HFA) 108 (90 Base) MCG/ACT inhaler Inhale 1-2 puffs into the lungs every 6 (six) hours as needed for wheezing or shortness of breath. 08/15/17   Penny Pia, MD  albuterol (PROVENTIL) (2.5 MG/3ML) 0.083% nebulizer solution Take 2.5 mg by nebulization every 6 (six) hours as needed for wheezing or shortness of breath.     [provider]  amLODipine (NORVASC) 5 MG tablet Take 1 tablet (5 mg total) by mouth daily. 03/30/18   Delano Metz, MD  aspirin 81 MG tablet Take 81 mg by mouth daily.    [provider]  clonazePAM (KLONOPIN) 0.5 MG tablet Take 1 tablet by mouth 2 (two) times daily as needed for anxiety. 01/31/18   [provider]  DULoxetine (CYMBALTA) 60 MG capsule Take 60 mg by mouth daily. 07/22/17   [provider]  ferrous sulfate 325 (65 FE) MG tablet Take 1 tablet (325 mg total) by mouth daily with breakfast. 09/01/16   [name], MD  Fluticasone-Umeclidin-Vilant (TRELEGY ELLIPTA) 100-62.5-25 MCG/INH AEPB Inhale 1 puff into the lungs daily.    [provider]  furosemide (LASIX) 20 MG tablet Take 1 tablet (20 mg total) by mouth daily. 03/30/18   Delano Metz, MD    ipratropium-albuterol (DUONEB) 0.5-2.5 (3) MG/3ML SOLN Take 3 mLs by nebulization every 6 (six) hours as needed. J44.9 06/17/18   Mannam, Colbert Coyer, MD  lisinopril (PRINIVIL,ZESTRIL) 20 MG tablet Take 1 tablet (20 mg total) by mouth daily. 03/29/18 03/29/19  Delano Metz, MD  LORazepam (ATIVAN) 0.5 MG tablet Take 1 tablet (0.5 mg total) by mouth every 8 (eight) hours as needed for up to 30 doses for anxiety. 03/29/18   Delano Metz, MD  montelukast (SINGULAIR) 10 MG tablet Take 10 mg by mouth at bedtime.    [provider]  omeprazole (PRILOSEC) 20 MG capsule Take 20 mg by mouth daily.    [provider]  predniSONE (DELTASONE) 10 MG tablet 4 tabs x 3 days, 3 tabs x 3 days, 2 tab x 3 days, 1 tab x 3 days 06/17/18   Chilton Greathouse, MD     Critical care time: 35 mins      Joneen Roach, AGACNP-BC [facility] Pulmonary/Critical Care Pager [phone] or [phone]  06/24/2018 12:12 PM

## 2018-06-24 NOTE — Progress Notes (Signed)
RT attempted to place pt on venturi mask at 6 L, 35%.  Pt's sat at 95% but she feels like she can not get a breath and has increased WOB.  Pt placed on Bipap again.

## 2018-06-24 NOTE — Progress Notes (Signed)
Daily Progress Note   Patient Name: Vanessa Wallace       Date: 06/24/2018 DOB: 1955/07/22  Age: 62 y.o. MRN#: [id number] Attending Physician: Collene Gobble, MD Primary Care Physician: Kateri Mc, MD Admit Date: 06/21/2018  Reason for Consultation/Follow-up: Establishing goals of care and Psychosocial/spiritual support  Subjective: At bedside with patient still intubated she is awake and alert.  She confirmed that she never wanted to be intubated.  She held my hand and indicated that she wanted me to stay with her while she was extubated.    She successfully extubated.  I spoke with her sister Vanessa Wallace on the phone to give her the good news.     We agreed to meet 12/11 at 10 am with Vanessa Wallace in order to document her wishes regarding intubation and life support.    Assessment: Patient still anxious.  Now extubated.  Has severe underlying lung disease and cachexia.  Has a significant history of cocaine abuse.   Patient Profile/HPI:  62 y.o. female  with past medical history of COPD, D-HF, tobacco and cocaine abuse, anxiety, depression and arthritis who was admitted on 06/21/2018 with shortness of breath.  She was found to be in hypercapnic respiratory failure and required BiPAP.  Unfortunately the patient has been not been tolerating the BiPAP well and has pulled it off.  When she does her oxygen saturation plummets into the 70s.  Patient had a similar admission in Sept 2019.  Her code status at that time was DNR.  Her echocardiogram in September showed pulmonary artery arterial pressure of 67 mmhg with diastolic HF grade 1 and an EF of 55%.    She saw Dr. Vaughan Browner outpatient on 06/17/2018.  She has had an abnormal CT with biapical soft tissue densities and is due for a follow up PET scan for  further evaluation.  Dr. Vaughan Browner felt her severe anxiety may prevent her from tolerating a PET scan.   He recommended Palliative discussions be continued.    Length of Stay: 2  Current Medications: Scheduled Meds:  . amLODipine  5 mg Oral Daily  . aspirin  81 mg Oral Daily  . chlorhexidine gluconate (MEDLINE KIT)  15 mL Mouth Rinse BID  . Chlorhexidine Gluconate Cloth  6 each Topical Q0600  . DULoxetine  60 mg Oral Daily  .  enoxaparin (LOVENOX) injection  30 mg Subcutaneous Q24H  . furosemide  20 mg Intravenous Daily  . ipratropium-albuterol  3 mL Nebulization TID  . mouth rinse  15 mL Mouth Rinse 10 times per day  . methylPREDNISolone (SOLU-MEDROL) injection  60 mg Intravenous Q12H  . mupirocin ointment  1 application Nasal BID  . nicotine  21 mg Transdermal Daily  . pantoprazole sodium  40 mg Per Tube Daily  . sodium chloride flush  3 mL Intravenous Q12H  . sodium chloride flush  3 mL Intravenous Q12H    Continuous Infusions: . sodium chloride    . dextrose 5 % and 0.45% NaCl 50 mL/hr at 06/24/18 1500  . levofloxacin (LEVAQUIN) IV Stopped (06/24/18 0236)  . propofol (DIPRIVAN) infusion Stopped (06/24/18 1207)    PRN Meds: sodium chloride, acetaminophen **OR** acetaminophen, albuterol, fentaNYL (SUBLIMAZE) injection, hydrALAZINE, ondansetron **OR** ondansetron (ZOFRAN) IV, sodium chloride flush  Physical Exam        Well developed cachectic female, anxious Awake alert orientated, extubated Tachycardic   Vital Signs: BP (!) 162/90   Pulse 99   Temp 98.4 F (36.9 C) (Oral)   Resp (!) 26   Ht _0  (1.575 m)   Wt 42.6 kg   SpO2 94%   BMI 17.19 kg/m  SpO2: SpO2: 94 % O2 Device: O2 Device: Nasal Cannula O2 Flow Rate: O2 Flow Rate (L/min): 3 L/min  Intake/output summary:   Intake/Output Summary (Last 24 hours) at 06/24/2018 1650 Last data filed at 06/24/2018 1500 Gross per 24 hour  Intake 447.46 ml  Output 1700 ml  Net -1252.54 ml   LBM: Last BM Date:  (PTA) Baseline Weight: Weight: 47 kg Most recent weight: Weight: 42.6 kg       Palliative Assessment/Data: 40%    Flowsheet Rows     Most Recent Value  Intake Tab  Referral Department  Hospitalist  Unit at Time of Referral  ER  Palliative Care Primary Diagnosis  Pulmonary  Date Notified  06/22/18  Palliative Care Type  New Palliative care  Reason for referral  Clarify Goals of Care  Date of Admission  06/21/18  Date first seen by Palliative Care  06/22/18  # of days Palliative referral response time  0 Day(s)  # of days IP prior to Palliative referral  1  Clinical Assessment  Psychosocial & Spiritual Assessment  Palliative Care Outcomes      Patient Active Problem List   Diagnosis Date Noted  . COPD with acute exacerbation ([place]) 06/22/2018  . Anxiety 06/22/2018  . Transaminasemia 06/22/2018  . Essential hypertension   . Acute respiratory failure with hypercapnia ([place])   . Polysubstance abuse ([place])   . Palliative care encounter   . Fluid overload 03/25/2018  . Protein-calorie malnutrition, severe 08/12/2017  . Protein-calorie malnutrition, moderate ([place]) 08/08/2017  . Iron deficiency anemia 08/08/2017  . Depression 08/08/2017  . Acute on chronic respiratory failure with hypoxia and hypercapnia ([place]) 08/08/2017  . COPD exacerbation ([place]) 08/30/2016  . COPD (chronic obstructive pulmonary disease) ([place]) 08/30/2016  . Asthma exacerbation 10/02/2014  . Renal insufficiency   . Chest wall pain   . Cough   . Cocaine use 09/22/2013  . Pneumonia 09/17/2013  . Community acquired pneumonia 09/17/2013    Palliative Care Plan    Recommendations/Plan:  PMT meeting with family tomorrow at 10:00 to confirm code status, advanced directives and GOC.  Goals of Care and Additional Recommendations:  Limitations on Scope of Treatment: Full Scope Treatment  Code Status:  Family requests that she be re-intubated if needed.  Patient has indicated that she does not want this.     Prognosis:   Difficult to determine.  She is at high risk for death from acute on chronic respiratory failure, but she may have a prognosis of weeks to months with continued aggressive support.  Discharge Planning:  To be determined.  Care plan was discussed with CCM, family, RN  Thank you for allowing the Palliative Medicine Team to assist in the care of this patient.  Total time spent:  60 min     Greater than 50%  of this time was spent counseling and coordinating care related to the above assessment and plan.  Florentina Jenny, PA-C Palliative Medicine  Please contact Palliative MedicineTeam phone at [phone] for questions and concerns between 7 am - 7 pm.   Please see AMION for individual provider pager numbers.

## 2018-06-25 LAB — GLUCOSE, CAPILLARY
GLUCOSE-CAPILLARY: 71 mg/dL (ref 70–99)
Glucose-Capillary: 100 mg/dL — ABNORMAL HIGH (ref 70–99)
Glucose-Capillary: 116 mg/dL — ABNORMAL HIGH (ref 70–99)
Glucose-Capillary: 117 mg/dL — ABNORMAL HIGH (ref 70–99)
Glucose-Capillary: 74 mg/dL (ref 70–99)
Glucose-Capillary: 75 mg/dL (ref 70–99)
Glucose-Capillary: 79 mg/dL (ref 70–99)
Glucose-Capillary: 79 mg/dL (ref 70–99)
Glucose-Capillary: 80 mg/dL (ref 70–99)

## 2018-06-25 LAB — BASIC METABOLIC PANEL
Anion gap: 9 (ref 5–15)
BUN: 33 mg/dL — ABNORMAL HIGH (ref 6–20)
CO2: 35 mmol/L — ABNORMAL HIGH (ref 22–32)
Calcium: 9.2 mg/dL (ref 8.9–10.3)
Chloride: 98 mmol/L (ref 98–111)
Creatinine, Ser: 0.51 mg/dL (ref 0.44–1.00)
GFR calc Af Amer: >60 mL/min (ref >60)
GFR calc non Af Amer: >60 mL/min (ref >60)
Glucose, Bld: 91 mg/dL (ref 70–99)
Potassium: 5.1 mmol/L (ref 3.5–5.1)
Sodium: 142 mmol/L (ref 135–145)

## 2018-06-25 LAB — CBC
HCT: 39 % (ref 36.0–46.0)
Hemoglobin: 11.5 g/dL — ABNORMAL LOW (ref 12.0–15.0)
MCH: 26.6 pg (ref 26.0–34.0)
MCHC: 29.5 g/dL — ABNORMAL LOW (ref 30.0–36.0)
MCV: 90.1 fL (ref 80.0–100.0)
NRBC: 0 % (ref 0.0–0.2)
Platelets: 159 10*3/uL (ref 150–400)
RBC: 4.33 MIL/uL (ref 3.87–5.11)
RDW: 17.2 % — AB (ref 11.5–15.5)
WBC: 8.1 10*3/uL (ref 4.0–10.5)

## 2018-06-25 LAB — POCT I-STAT 3, ART BLOOD GAS (G3+)
Acid-Base Excess: 9 mmol/L — ABNORMAL HIGH (ref 0.0–2.0)
Acid-Base Excess: 9 mmol/L — ABNORMAL HIGH (ref 0.0–2.0)
Bicarbonate: 39.8 mmol/L — ABNORMAL HIGH (ref 20.0–28.0)
Bicarbonate: 38.6 mmol/L — ABNORMAL HIGH (ref 20.0–28.0)
O2 SAT: 94 %
O2 SAT: 94 %
Patient temperature: 96.7
Patient temperature: 96.7
TCO2: 43 mmol/L — ABNORMAL HIGH (ref 22–32)
TCO2: 41 mmol/L — ABNORMAL HIGH (ref 22–32)
pCO2 arterial: 87.3 mmHg (ref 32.0–48.0)
pCO2 arterial: 77.3 mmHg (ref 32.0–48.0)
pH, Arterial: 7.261 — ABNORMAL LOW (ref 7.350–7.450)
pH, Arterial: 7.301 — ABNORMAL LOW (ref 7.350–7.450)
pO2, Arterial: 80 mmHg — ABNORMAL LOW (ref 83.0–108.0)
pO2, Arterial: 76 mmHg — ABNORMAL LOW (ref 83.0–108.0)

## 2018-06-25 LAB — MAGNESIUM: Magnesium: 2.2 mg/dL (ref 1.7–2.4)

## 2018-06-25 MED ORDER — LORAZEPAM 2 MG/ML IJ SOLN
0.5000 mg | Freq: Four times a day (QID) | INTRAMUSCULAR | Status: DC | PRN
  Administered 2018-06-26 – 2018-07-02 (×10): 0.5 mg via INTRAVENOUS
  Filled 2018-06-25 (×10): qty 1

## 2018-06-25 MED ORDER — PANTOPRAZOLE SODIUM 40 MG IV SOLR
40.0000 mg | Freq: Every day | INTRAVENOUS | Status: DC
  Administered 2018-06-25 – 2018-06-27 (×3): 40 mg via INTRAVENOUS
  Filled 2018-06-25 (×4): qty 40

## 2018-06-25 MED ORDER — DEXMEDETOMIDINE HCL IN NACL 400 MCG/100ML IV SOLN
0.2000 ug/kg/h | INTRAVENOUS | Status: DC
  Administered 2018-06-25 – 2018-06-26 (×2): 0.4 ug/kg/h via INTRAVENOUS
  Filled 2018-06-25 (×3): qty 100

## 2018-06-25 NOTE — Progress Notes (Signed)
SLP Cancellation Note  Patient Details Name: Vanessa FordVivian A Bluitt MRN: [id number] DOB: 01-27-1956   Cancelled treatment:       Reason Eval/Treat Not Completed: Patient not medically ready. Pt on BiPAP   Avalee Castrellon, Riley NearingBonnie Caroline 06/25/2018, 2:10 PM

## 2018-06-25 NOTE — Progress Notes (Signed)
Pt placed back on Bipap due to increased WOB and SOB. Family and RN at bedside.

## 2018-06-25 NOTE — Progress Notes (Signed)
Nutrition Follow-up  DOCUMENTATION CODES:   Severe malnutrition in context of chronic illness  INTERVENTION:    Magic cup TID with meals, each supplement provides 290 kcal and 9 grams of protein  Provide MVI daily  NUTRITION DIAGNOSIS:   Severe Malnutrition related to chronic illness as evidenced by energy intake < 75% for > or equal to 3 months, severe fat depletion, severe muscle depletion.  Ongoing  GOAL:   Patient will meet greater than or equal to 90% of their needs  Not met  MONITOR:   Diet advancement, Vent status, Labs, Weight trends  REASON FOR ASSESSMENT:   Ventilator    ASSESSMENT:   62 yo female, admitted with acute exacerbation of COPD. PMH significant for chronic hypoxic and hypercarbic respiratory failure, chronic diastolic CHF, drug abuse, tobacco use, HTN, depression with anxiety. Intubated 12/7.   12/10- pt extubated, diet advanced to DYS 2 with thin liquids per SLP  Venti mask attempted yesterday but pt experienced increased work of breathing. She was placed on BiPAP and has been on it since. Pt has not had any meals due to this. RD visited pt in room, she was unable to answer questions due to lethargy.   This RD saw pt in Sept and recommended Magic Cups as she does not like Ensure. Will start with this option until pt can provided preferences.   PMT met with family yesterday and they determined DNR/DNI status but would want for pt to be re-intubated if needed. Follow up meeting planned for this afternoon.   Weight noted to remain stable at 94 lb. Will continue to monitor trends.   Medications reviewed and include: 20 mg lasix once daily, solu-medrol, precedex, D5 in 1/2NS @ 50 ml/hr Labs reviewed: CBG 71-116   Diet Order:   Diet Order            DIET DYS 2 Room service appropriate? Yes; Fluid consistency: Thin  Diet effective now              EDUCATION NEEDS:   No education needs have been identified at this time  Skin:  Skin  Assessment: Reviewed RN Assessment  Last BM:  PTA  Height:   Ht Readings from Last 1 Encounters:  06/22/18 '5\' 2"'  (1.575 m)    Weight:   Wt Readings from Last 1 Encounters:  06/25/18 42.8 kg    Ideal Body Weight:  50 kg  BMI:  Body mass index is 17.26 kg/m.  Estimated Nutritional Needs:   Kcal:  1400-1600 kcal  Protein:  70-85 grams  Fluid:  >/= 1.4 L/day   Mariana Single RD, LDN Clinical Nutrition Pager # - [phone]

## 2018-06-25 NOTE — Progress Notes (Addendum)
Daily Progress Note   Patient Name: Vanessa Wallace       Date: 06/25/2018 DOB: 01-19-1956  Age: 62 y.o. MRN#: [id number] Attending Physician: Collene Gobble, MD Primary Care Physician: Kateri Mc, MD Admit Date: 06/21/2018  Reason for Consultation/Follow-up: Establishing goals of care  Subjective: Met with patient at bedside.  She is on BiPAP and alert and orientated.  Anxious.  I commented "Its good to see you off of the tube"  She immediately sat up in distress and said "Tube?".  Vanessa Wallace does not want the tube back down her throat.  She has made that clear multiple times.    I then met with Vanessa Wallace and the patient's only living son, Vanessa Wallace.  We discussed on again the condition of her lungs and the condition of her heart.  I explained that Vanessa Wallace's body is unlikely to significantly improve and we will continue to be right back at this point over and over again.  We reviewed the MOST form.  Vanessa Wallace and Vanessa Wallace agreed that if Vanessa Wallace arrests they do not want her put thru a code and they understand her wishes not to be on "the tube" again.  Vanessa Wallace and Vanessa Wallace are clear that Vanessa Wallace does not want intubation and she is a DNR/DNI.  Vanessa Wallace and Vanessa Baltimore do want Vanessa Wallace to continue with full scope treatment including BiPAP.   They are hopeful that Vanessa Wallace will improve and be able to leave the hospital but they do understand that her lungs and heart are permanently damaged.  Vanessa Wallace asked "Would it help if I gave her one of my lungs?"  I explain that while that is a very generous offer his mother's body could not handle organ transplant surgery - a lung transplant is not an option in this circumstance.  I asked Vanessa Wallace if he wanted me to ask his mother about intubation again in front of him so that he  could hear it for himself.  He answered no.  He was in agreement with our discussion.   Assessment: 62 yof with history of polysub abuse and severe anxiety - worse since her husband died 4 years ago.  65 lb weight loss this year.  Advanced lung disease.  Pulmonary arterial hypertension.  Recurrent hypercapnic respiratory failure.  Now on BiPAP.  Patient Profile/HPI:  62  y.o.femalewith past medical history of COPD, D-HF, tobacco and cocaine abuse, anxiety, depression and arthritiswho was admitted on 12/7/2019with shortness of breath. She was found to be in hypercapnic respiratory failure and required BiPAP. Unfortunately the patient has been not been tolerating the BiPAP well and has pulled it off. When she does her oxygen saturation plummets into the 70s.  Patient had a similar admission in Sept 2019. Her code status at that time was DNR.  Her echocardiogram in September showed pulmonary artery arterial pressure of 67 mmhg with diastolic HF grade 1 and an EF of 55%.    She saw Dr. Vaughan Browner outpatient on 06/17/2018. She has had an abnormal CT with biapical soft tissue densities and is due for a follow up PET scan for further evaluation. Dr. Vaughan Browner felt her severe anxiety may prevent her from tolerating a PET scan. He recommended Palliative discussions be continued.  Length of Stay: 3  Current Medications: Scheduled Meds:  . amLODipine  5 mg Oral Daily  . aspirin  81 mg Oral Daily  . chlorhexidine gluconate (MEDLINE KIT)  15 mL Mouth Rinse BID  . Chlorhexidine Gluconate Cloth  6 each Topical Q0600  . clonazepam  0.5 mg Oral BID  . DULoxetine  60 mg Oral Daily  . enoxaparin (LOVENOX) injection  30 mg Subcutaneous Q24H  . furosemide  20 mg Intravenous Daily  . ipratropium-albuterol  3 mL Nebulization TID  . lisinopril  20 mg Oral Daily  . mouth rinse  15 mL Mouth Rinse 10 times per day  . methylPREDNISolone (SOLU-MEDROL) injection  60 mg Intravenous Q12H  . mupirocin ointment  1  application Nasal BID  . nicotine  21 mg Transdermal Daily  . pantoprazole sodium  40 mg Per Tube Daily  . sodium chloride flush  3 mL Intravenous Q12H  . sodium chloride flush  3 mL Intravenous Q12H    Continuous Infusions: . sodium chloride    . dexmedetomidine (PRECEDEX) IV infusion 0.5 mcg/kg/hr (06/25/18 0600)  . dextrose 5 % and 0.45% NaCl 50 mL/hr at 06/25/18 0600  . levofloxacin (LEVAQUIN) IV Stopped (06/25/18 0239)  . propofol (DIPRIVAN) infusion Stopped (06/24/18 1207)    PRN Meds: sodium chloride, acetaminophen **OR** acetaminophen, albuterol, fentaNYL (SUBLIMAZE) injection, hydrALAZINE, LORazepam, ondansetron **OR** ondansetron (ZOFRAN) IV, sodium chloride flush  Physical Exam        Cachectically thin female, asleep on the BiPAP.   CV RRR Resp scattered anterior crackles Abdomen thin, firm, ND Ext no edema.  Vital Signs: BP 94/63   Pulse 79   Temp (!) 97.1 F (36.2 C) (Axillary)   Resp 18   Ht '5\' 2"'  (1.575 m)   Wt 42.8 kg   SpO2 97%   BMI 17.26 kg/m  SpO2: SpO2: 97 % O2 Device: O2 Device: Bi-PAP O2 Flow Rate: O2 Flow Rate (L/min): 30 L/min  Intake/output summary:   Intake/Output Summary (Last 24 hours) at 06/25/2018 6579 Last data filed at 06/25/2018 0600 Gross per 24 hour  Intake 987.26 ml  Output 1725 ml  Net -737.74 ml   LBM: Last BM Date: (PTA) Baseline Weight: Weight: 47 kg Most recent weight: Weight: 42.8 kg       Palliative Assessment/Data:  40%    Flowsheet Rows     Most Recent Value  Intake Tab  Referral Department  Hospitalist  Unit at Time of Referral  ER  Palliative Care Primary Diagnosis  Pulmonary  Date Notified  06/22/18  Palliative Care Type  New Palliative care  Reason for referral  Clarify Goals of Care  Date of Admission  06/21/18  Date first seen by Palliative Care  06/22/18  # of days Palliative referral response time  0 Day(s)  # of days IP prior to Palliative referral  1  Clinical Assessment  Psychosocial &  Spiritual Assessment  Palliative Care Outcomes      Patient Active Problem List   Diagnosis Date Noted  . COPD with acute exacerbation ([place]) 06/22/2018  . Anxiety 06/22/2018  . Transaminasemia 06/22/2018  . Essential hypertension   . Acute respiratory failure with hypercapnia ([place])   . Polysubstance abuse ([place])   . Palliative care encounter   . Fluid overload 03/25/2018  . Protein-calorie malnutrition, severe 08/12/2017  . Protein-calorie malnutrition, moderate ([place]) 08/08/2017  . Iron deficiency anemia 08/08/2017  . Depression 08/08/2017  . Acute on chronic respiratory failure with hypoxia and hypercapnia ([place]) 08/08/2017  . COPD exacerbation ([place]) 08/30/2016  . COPD (chronic obstructive pulmonary disease) ([place]) 08/30/2016  . Asthma exacerbation 10/02/2014  . Renal insufficiency   . Chest wall pain   . Cough   . Cocaine use 09/22/2013  . Pneumonia 09/17/2013  . Community acquired pneumonia 09/17/2013    Palliative Care Plan    Recommendations/Plan:  Changed to DNR / DNI however with full scope treatment including BiPAP.   Patient is benzodiazepine dependent.  I added ativan PRN.  Please add back clonazepam when she is stable enough for it.  PMT will follow intermittently.  I will be back on Saturday.  Please call our office if needed before then.  [phone]  Goals of Care and Additional Recommendations:  Limitations on Scope of Treatment: Full Scope Treatment  Code Status:  DNR  Prognosis:   < 6 months she is at high risk for rapid acute decline and death from hypercapnic respiratory failure or other adverse event.   Discharge Planning:  To Be Determined  Care plan was discussed with RN Lilia Pro), Family, CCM NP  Thank you for allowing the Palliative Medicine Team to assist in the care of this patient.  Total time spent:  35 min.     Greater than 50%  of this time was spent counseling and coordinating care related to the above assessment and  plan.  Florentina Jenny, PA-C Palliative Medicine  Please contact Palliative MedicineTeam phone at [phone] for questions and concerns between 7 am - 7 pm.   Please see AMION for individual provider pager numbers.

## 2018-06-25 NOTE — Progress Notes (Signed)
OT Cancellation Note  Patient Details Name: Vanessa FordVivian A Landress MRN: [id number] DOB: [birth year]   Cancelled Treatment:    Reason Eval/Treat Not Completed: Medical issues which prohibited therapy(iscussed with RN, pt with increased WOB and requiring bipap, requests we hold for now. Will following as schedule allows. Thank you.)  Abednego Yeates M Nery Kalisz Yazmen Briones MSOT, OTR/L Acute Rehab Pager: [phone] Office: [phone] 06/25/2018, 1:20 PM

## 2018-06-25 NOTE — Progress Notes (Signed)
Pt taken off Bipap for a rest and placed on [place] 6L.  Pt tolerating well at this time.  MD, RN, and family at bedside.

## 2018-06-25 NOTE — Progress Notes (Signed)
PT Cancellation Note  Patient Details Name: Vanessa FordVivian A Wallace MRN: [id number] DOB: [birth year]   Cancelled Treatment:    Reason Eval/Treat Not Completed: Medical issues which prohibited therapy, discussed with RN, pt with increased WOB and requiring bipap, requests we hold for now. Will cont to follow.   Etta GrandchildSean Ishi Danser, PT, DPT Acute Rehabilitation Services Pager: [phone] Office: [phone]     Etta GrandchildSean Russell Quinney 06/25/2018, 10:58 AM

## 2018-06-25 NOTE — Progress Notes (Signed)
NAME:  Vanessa FordVivian A Coward, MRN:  [id number], DOB:  03/15/1956, LOS: 3 ADMISSION DATE:  06/21/2018, CONSULTATION DATE:  12/819 REFERRING MD:  Dr. Isidoro Donningai, MD CHIEF COMPLAINT:  Altered mental status, respiratory acidosis  Brief History    62 year old female with severe emphysema with chronic hypercapnic and hypoxemic respiratory failure transferred from stepdown unit for persistent respiratory acidosis and worsening mental status refractory to BiPAP.  Transferred to PCCM for management of acute on chronic hypercapnic and hypoxic respiratory failure requiring intubation.  Past Medical History  COPD, Chronic hypoxemic and hypercapneic respiratory failure, Chronic diastolic heart failure  Significant Hospital Events   12/7 Admitted 12/8 PCCM  Consults:  PCCM > 12/8  Procedures:  ETT 12/8 >   Significant Diagnostic Tests:  ABG 12/7 00:37 7.3/69/123 ABG 12/7 09:00 7.26/81/103  Micro Data:  BCx 12/7 > Sputum >  Antimicrobials:  Levaquin 12/8 >  Interim history/subjective:  PMT meeting yesterday determined DNR DNI, however, family this morning telling me that she may want to be intubated if fails extubation.  WUA and SBT ongoing.   Objective   Blood pressure 94/63, pulse 79, temperature (!) 97.1 F (36.2 C), temperature source Axillary, resp. rate 18, height 5\' 2"  (1.575 m), weight 42.8 kg, SpO2 97 %.    Vent Mode: BIPAP;PCV FiO2 (%):  [40 %-100 %] 40 % Set Rate:  [15 bmp-25 bmp] 15 bmp Vt Set:  [400 mL] 400 mL PEEP:  [5 cmH20-6 cmH20] 6 cmH20 Pressure Support:  [5 cmH20] 5 cmH20 Plateau Pressure:  [16 cmH20] 16 cmH20   Intake/Output Summary (Last 24 hours) at 06/25/2018 [id number] Last data filed at 06/25/2018 0600 Gross per 24 hour  Intake 987.26 ml  Output 1725 ml  Net -737.74 ml   Filed Weights   06/21/18 2329 06/22/18 0500 06/25/18 0500  Weight: 47 kg 42.6 kg 42.8 kg    Examination: General: Frail elderly female who appears much older than her stated age HEENT: Currently  noninvasive mechanical ventilatory support in place Neuro: Agitated with decreased level of consciousness intermittently CV: Sounds are regular PULM: Decreased air movement poor pulmonary excursion despite BiPAP NG:EXBMGI:soft, non-tender, bsx4 active  Extremities: warm/dry, plus edema  Skin: no rashes or lesions   Resolved Hospital Problem list     Assessment & Plan:   Acute on chronic hypercapneic and hypoxemic respiratory failure Severe Emphysema -Currently on noninvasive mechanical ventilatory support -Continue steroids and nebulizers and antibiotic.    Altered mental status due to metabolic encephalopathy. Likely related to hypercapneia. -Since extubation receiving as needed angiolytics  Chronic diastolic heart failure (grade 1). LVEF 50-55% . Euvolemic on exam, but oliguric.  -Lasix as tolerated  Anxiety -Holding home Klonopin due to altered mental status  Hypotension: likely related to sedatives. Hypertensive at baseline. RESOLVED. Now somewhat hypertensive.  Off pressors Monitor  Goals of Care : Previously DNR.  -Patient has been extubated but is classified as reintubated as needed, currently on BiPAP  Best practice:  Diet: Currently n.p.o. on BiPAP Pain/Anxiety/Delirium protocol currently on BiPAP receiving Angiulli VAP protocol (if indicated): yes DVT prophylaxis: Lovenox GI prophylaxis: PPI Glucose control: None Mobility: Bed rest Code Status: DNR, but would want re-intubation if fails extubation.  Family Communication: 06/25/2018 boyfriend updated at bedside Disposition: ICU  Labs   CBC: Recent Labs  Lab 06/21/18 2339 06/22/18 0419 06/23/18 0419 06/24/18 0329 06/25/18 0508  WBC 7.0 6.9 7.0 4.9 8.1  NEUTROABS 5.5 6.4  --   --   --   HGB 12.3  11.3* 10.4* 10.8* 11.5*  HCT 43.7 39.0 36.4 38.2 39.0  MCV 93.6 91.5 89.2 89.9 90.1  PLT 229 216 226 166 159    Basic Metabolic Panel: Recent Labs  Lab 06/21/18 2339 06/22/18 0419 06/23/18 0419  06/24/18 0329 06/25/18 0508  NA 143 142 140 141 142  K 4.2 4.2 5.0 5.1 5.1  CL 99 99 101 100 98  CO2 32 32 30 34* 35*  GLUCOSE 94 105* 130* 98 91  BUN 17 19 39* 40* 33*  CREATININE 0.67 0.72 1.30* 0.89 0.51  CALCIUM 9.3 8.9 8.8* 9.3 9.2  MG  --   --   --  2.5* 2.2  PHOS  --   --   --  4.3  --    GFR: Estimated Creatinine Clearance: 50.5 mL/min (by C-G formula based on SCr of 0.51 mg/dL). Recent Labs  Lab 06/22/18 0419 06/23/18 0419 06/24/18 0329 06/25/18 0508  WBC 6.9 7.0 4.9 8.1    Liver Function Tests: Recent Labs  Lab 06/21/18 2339  AST 62*  ALT 46*  ALKPHOS 66  BILITOT 0.7  PROT 8.1  ALBUMIN 3.7   No results for input(s): LIPASE, AMYLASE in the last 168 hours. No results for input(s): AMMONIA in the last 168 hours.  ABG    Component Value Date/Time   PHART 7.301 (L) 06/25/2018 0318   PCO2ART 77.3 (HH) 06/25/2018 0318   PO2ART 76.0 (L) 06/25/2018 0318   HCO3 38.6 (H) 06/25/2018 0318   TCO2 41 (H) 06/25/2018 0318   ACIDBASEDEF 2.0 08/08/2017 0512   O2SAT 94.0 06/25/2018 0318     Coagulation Profile: No results for input(s): INR, PROTIME in the last 168 hours.  Cardiac Enzymes: No results for input(s): CKTOTAL, CKMB, CKMBINDEX, TROPONINI in the last 168 hours.  HbA1C: Hgb A1c MFr Bld  Date/Time Value Ref Range Status  08/30/2016 06:46 PM 5.5 4.8 - 5.6 % Final    Comment:    (NOTE)         Pre-diabetes: 5.7 - 6.4         Diabetes: >6.4         Glycemic control for adults with diabetes: <7.0     CBG: Recent Labs  Lab 06/24/18 1929 06/25/18 0011 06/25/18 0333 06/25/18 0759 06/25/18 0835  GLUCAP 112* 117* 116* 71 74      Critical care time: 35 mins      Steve Antony Sian ACNP [facility] PCCM Pager [phone] till 1 pm If no answer page [phone] 06/25/2018, 9:39 AM

## 2018-06-25 NOTE — Progress Notes (Signed)
Patient currently tolerating BIPAP well. RT will continue to monitor.

## 2018-06-26 ENCOUNTER — Encounter: Payer: Self-pay | Admitting: Emergency Medicine

## 2018-06-26 ENCOUNTER — Inpatient Hospital Stay (HOSPITAL_COMMUNITY): Payer: Medicaid Other

## 2018-06-26 LAB — GLUCOSE, CAPILLARY
Glucose-Capillary: 108 mg/dL — ABNORMAL HIGH (ref 70–99)
Glucose-Capillary: 75 mg/dL (ref 70–99)
Glucose-Capillary: 135 mg/dL — ABNORMAL HIGH (ref 70–99)
Glucose-Capillary: 154 mg/dL — ABNORMAL HIGH (ref 70–99)
Glucose-Capillary: 119 mg/dL — ABNORMAL HIGH (ref 70–99)

## 2018-06-26 LAB — BASIC METABOLIC PANEL
Anion gap: 7 (ref 5–15)
BUN: 30 mg/dL — ABNORMAL HIGH (ref 6–20)
CO2: 37 mmol/L — AB (ref 22–32)
Calcium: 9.2 mg/dL (ref 8.9–10.3)
Chloride: 96 mmol/L — ABNORMAL LOW (ref 98–111)
Creatinine, Ser: 0.6 mg/dL (ref 0.44–1.00)
GFR calc Af Amer: >60 mL/min (ref >60)
Glucose, Bld: 123 mg/dL — ABNORMAL HIGH (ref 70–99)
Potassium: 5.3 mmol/L — ABNORMAL HIGH (ref 3.5–5.1)
Sodium: 140 mmol/L (ref 135–145)

## 2018-06-26 LAB — MAGNESIUM: MAGNESIUM: 2.2 mg/dL (ref 1.7–2.4)

## 2018-06-26 LAB — PHOSPHORUS: Phosphorus: 3.2 mg/dL (ref 2.5–4.6)

## 2018-06-26 MED ORDER — MORPHINE SULFATE (CONCENTRATE) 10 MG/0.5ML PO SOLN
2.6000 mg | ORAL | Status: DC | PRN
  Administered 2018-06-26 – 2018-07-04 (×8): 2.6 mg via ORAL
  Filled 2018-06-26 (×8): qty 0.5

## 2018-06-26 MED ORDER — DULOXETINE HCL 60 MG PO CPEP
60.0000 mg | ORAL_CAPSULE | Freq: Every day | ORAL | Status: DC
  Administered 2018-06-26 – 2018-07-04 (×9): 60 mg via ORAL
  Filled 2018-06-26 (×9): qty 1

## 2018-06-26 MED ORDER — CLONAZEPAM 0.5 MG PO TBDP
0.5000 mg | ORAL_TABLET | Freq: Two times a day (BID) | ORAL | Status: DC
  Administered 2018-06-26 – 2018-07-04 (×18): 0.5 mg via ORAL
  Filled 2018-06-26 (×18): qty 1

## 2018-06-26 MED ORDER — METHYLPREDNISOLONE SODIUM SUCC 40 MG IJ SOLR
40.0000 mg | Freq: Two times a day (BID) | INTRAMUSCULAR | Status: DC
  Administered 2018-06-26 – 2018-07-01 (×10): 40 mg via INTRAVENOUS
  Filled 2018-06-26 (×10): qty 1

## 2018-06-26 MED ORDER — ORAL CARE MOUTH RINSE
15.0000 mL | Freq: Two times a day (BID) | OROMUCOSAL | Status: DC
  Administered 2018-06-26 – 2018-07-03 (×7): 15 mL via OROMUCOSAL

## 2018-06-26 MED ORDER — CHLORHEXIDINE GLUCONATE 0.12 % MT SOLN
15.0000 mL | Freq: Two times a day (BID) | OROMUCOSAL | Status: DC
  Administered 2018-06-26 – 2018-07-04 (×18): 15 mL via OROMUCOSAL
  Filled 2018-06-26 (×16): qty 15

## 2018-06-26 MED ORDER — MORPHINE SULFATE (CONCENTRATE) 10 MG/0.5ML PO SOLN: 2.5000 mg | ORAL | Status: DC | PRN

## 2018-06-26 NOTE — Progress Notes (Signed)
eLink Physician-Brief Progress Note Patient Name: Vanessa FordVivian A Casebeer DOB: Nov 17, 1955 MRN: [id number]   Date of Service  06/26/2018  HPI/Events of Note  Bradycardia  eICU Interventions  Hold precedex.  If pt becomes agitated, start lower dose of precedex.       Intervention Category Intermediate Interventions: Arrhythmia - evaluation and management  Larinda ButteryVanessa Katy Brickell 06/26/2018, 4:55 AM

## 2018-06-26 NOTE — Progress Notes (Signed)
Pt not wanting to wear bipap for the night. RT will continue to monitor.

## 2018-06-26 NOTE — Progress Notes (Signed)
  Speech Language Pathology Treatment: Dysphagia  Patient Details Name: Eliot FordVivian A Doolen MRN: [id number] DOB: [birth year] Today's Date: 06/26/2018 Time: 1203-1220 SLP Time Calculation (min) (ACUTE ONLY): 17 min  Assessment / Plan / Recommendation Clinical Impression  Observed patient with consumption of dysphagia 1 and 3 solids as well as thin liquids. Patient continues to appear to be protecting her airway with clear vocal quality and no overt indication of aspiration post intake. Patient however with significant WOB increasing aspiration risk. Independently with slow oral transit and intermittent oral holding of both liquids in solids in preparation for coordination of breath and swallow. Given SOB, continue to recommend current diet. Will continue to follow along for diet upgrade as work of breathing improves.    HPI HPI: 62 y.o. female admitted 06/21/18 with SOB. PMH: COPD with chronic hypoxic and hypercarbic respiratory failure, chronic diastolic CHF, drug abuse, depression with anxiety, and hypertension      SLP Plan  Continue with current plan of care       Recommendations  Diet recommendations: Dysphagia 2 (fine chop);Thin liquid Liquids provided via: Straw Medication Administration: Whole meds with liquid Supervision: Patient able to self feed;Full supervision/cueing for compensatory strategies Compensations: Small sips/bites;Slow rate(take frequent breaks to control SOB) Postural Changes and/or Swallow Maneuvers: Seated upright 90 degrees            Jahdai Padovano MA, CCC-SLP        Haileyann Staiger Meryl 06/26/2018, 12:20 PM

## 2018-06-26 NOTE — Progress Notes (Signed)
PT Cancellation Note  Patient Details Name: Vanessa Wallace MRN: [id number] DOB: October 17, 1955   Cancelled Treatment:    Reason Eval/Treat Not Completed: Medical issues which prohibited therapy discussed with RN, pt with brady this AM and still on bipap, RN asks we hold as she is still too anxious for therapy, will check back later as time permits.   Etta GrandchildSean Mal Asher, PT, DPT Acute Rehabilitation Services Pager: [phone] Office: [phone]     Etta GrandchildSean Maxximus Gotay 06/26/2018, 8:28 AM

## 2018-06-26 NOTE — Progress Notes (Signed)
RT NOTES: Removed patient from bipap and placed on 5lpm nasal cannula. RN aware.

## 2018-06-26 NOTE — Progress Notes (Signed)
 NAME:  Vanessa Wallace, MRN:  [id number], DOB:  05/14/1956, LOS: 4 ADMISSION DATE:  06/21/2018, CONSULTATION DATE:  12/819 REFERRING MD:  Dr. Rai, MD CHIEF COMPLAINT:  Altered mental status, respiratory acidosis  Brief History    62-year-old female with severe emphysema with chronic hypercapnic and hypoxemic respiratory failure transferred from stepdown unit for persistent respiratory acidosis and worsening mental status refractory to BiPAP.  Transferred to PCCM for management of acute on chronic hypercapnic and hypoxic respiratory failure requiring intubation.  Past Medical History  COPD, Chronic hypoxemic and hypercapneic respiratory failure, Chronic diastolic heart failure  Significant Hospital Events   12/7 Admitted 12/8 PCCM 06/25/2018 palliative care meeting with full DNR per patient's request..  Consults:  PCCM > 12/8  Procedures:  ETT 12/8 > 06/24/2018  Significant Diagnostic Tests:  ABG 12/7 00:37 7.3/69/123 ABG 12/7 09:00 7.26/81/103  Micro Data:  BCx 12/7 > no growth at this time>> Sputum > never done  Antimicrobials:  Levaquin 12/8 > 06/26/2018  Interim history/subjective:  Met with palliative care 06/25/2018 is full DNR does not want to be intubated per her request. 06/26/2017 start low-dose morphine and anxiolytics to control her air hunger We will need to transfer her to a stepdown unit in the near future  Objective   Blood pressure (!) 159/88, pulse 72, temperature 98.5 F (36.9 C), temperature source Axillary, resp. rate 15, height 5' 2" (1.575 m), weight 42.7 kg, SpO2 97 %.    Vent Mode: PCV FiO2 (%):  [35 %-40 %] 35 % Set Rate:  [15 bmp] 15 bmp PEEP:  [6 cmH20] 6 cmH20 Plateau Pressure:  [14 cmH20-15 cmH20] 14 cmH20   Intake/Output Summary (Last 24 hours) at 06/26/2018 1147 Last data filed at 06/26/2018 1130 Gross per 24 hour  Intake 1294.08 ml  Output 1555 ml  Net -260.92 ml   Filed Weights   06/22/18 0500 06/25/18 0500 06/26/18 1130    Weight: 42.6 kg 42.8 kg 42.7 kg    Examination: General: Frail cachectic female with increased work of breathing currently off noninvasive mechanical ventilatory support HEENT: No JVD lymphadenopathy is appreciated Neuro: Anxious but intact CV: Sounds are regular PULM: Creased utilization of accessory muscles, dyspneic at rest.  Receiving morphine. GI:soft, non-tender, bsx4 active  Extremities: warm/dry, 1+ edema  Skin: no rashes or lesions   Resolved Hospital Problem list     Assessment & Plan:   Acute on chronic hypercapneic and hypoxemic respiratory failure Severe Emphysema -PRN noninvasive mechanical ventilatory support -Wean steroids to 40 mg IV every 12 -Complete Levaquin today 06/26/2018   Altered mental status due to metabolic encephalopathy. Likely related to hypercapneia. -Since extubation receiving as needed angiolytics  Chronic diastolic heart failure (grade 1). LVEF 50-55% . Euvolemic on exam, but oliguric.  -Diuresis as tolerated  Hyperkalemia Lasix as ordered trend daily  Anxiety -Low-dose morphine -Resume Klonopin  Hypotension: likely related to sedatives. Hypertensive at baseline. RESOLVED. Now somewhat hypertensive.  She is off pressors Now full DNR would not go back on pressors if needed  Goals of Care : Full DNR.  Met with palliative care 06/25/2018 per her wishes does not want to be reintubated -PRN BiPAP -PRN morphine -PRN anxiolytic  Best practice:  Diet: Currently n.p.o. on BiPAP Pain/Anxiety/Delirium protocol .  She has been started on low-dose morphine for dyspnea and anxiety along with other anxiolytics with more of a comfort approach rather than curative approach VAP protocol (if indicated): yes DVT prophylaxis: Lovenox GI prophylaxis: PPI Glucose control: None   Mobility: Bed rest Code Status: DNR, full DNR.  She met with palliative care and per her own wishes we will want to be intubated. Family Communication: 06/2018 patient  family updated at bedside. Disposition: ICU  Labs   CBC: Recent Labs  Lab 06/21/18 2339 06/22/18 0419 06/23/18 0419 06/24/18 0329 06/25/18 0508  WBC 7.0 6.9 7.0 4.9 8.1  NEUTROABS 5.5 6.4  --   --   --   HGB 12.3 11.3* 10.4* 10.8* 11.5*  HCT 43.7 39.0 36.4 38.2 39.0  MCV 93.6 91.5 89.2 89.9 90.1  PLT 229 216 226 166 716    Basic Metabolic Panel: Recent Labs  Lab 06/22/18 0419 06/23/18 0419 06/24/18 0329 06/25/18 0508 06/26/18 0210  NA 142 140 141 142 140  K 4.2 5.0 5.1 5.1 5.3*  CL 99 101 100 98 96*  CO2 32 30 34* 35* 37*  GLUCOSE 105* 130* 98 91 123*  BUN 19 39* 40* 33* 30*  CREATININE 0.72 1.30* 0.89 0.51 0.60  CALCIUM 8.9 8.8* 9.3 9.2 9.2  MG  --   --  2.5* 2.2 2.2  PHOS  --   --  4.3  --  3.2   GFR: Estimated Creatinine Clearance: 50.4 mL/min (by C-G formula based on SCr of 0.6 mg/dL). Recent Labs  Lab 06/22/18 0419 06/23/18 0419 06/24/18 0329 06/25/18 0508  WBC 6.9 7.0 4.9 8.1    Liver Function Tests: Recent Labs  Lab 06/21/18 2339  AST 62*  ALT 46*  ALKPHOS 66  BILITOT 0.7  PROT 8.1  ALBUMIN 3.7   No results for input(s): LIPASE, AMYLASE in the last 168 hours. No results for input(s): AMMONIA in the last 168 hours.  ABG    Component Value Date/Time   PHART 7.301 (L) 06/25/2018 0318   PCO2ART 77.3 (HH) 06/25/2018 0318   PO2ART 76.0 (L) 06/25/2018 0318   HCO3 38.6 (H) 06/25/2018 0318   TCO2 41 (H) 06/25/2018 0318   ACIDBASEDEF 2.0 08/08/2017 0512   O2SAT 94.0 06/25/2018 0318     Coagulation Profile: No results for input(s): INR, PROTIME in the last 168 hours.  Cardiac Enzymes: No results for input(s): CKTOTAL, CKMB, CKMBINDEX, TROPONINI in the last 168 hours.  HbA1C: Hgb A1c MFr Bld  Date/Time Value Ref Range Status  08/30/2016 06:46 PM 5.5 4.8 - 5.6 % Final    Comment:    (NOTE)         Pre-diabetes: 5.7 - 6.4         Diabetes: >6.4         Glycemic control for adults with diabetes: <7.0     CBG: Recent Labs  Lab  06/25/18 1541 06/25/18 1959 06/25/18 2337 06/26/18 0322 06/26/18 0910  GLUCAP 79 100* 79 108* 75      Critical care time: 35 mins      Steve Jewell Ryans ACNP [facility] PCCM Pager [phone] till 1 pm If no answer page [phone] 06/26/2018, 11:47 AM

## 2018-06-27 LAB — BASIC METABOLIC PANEL
ANION GAP: 11 (ref 5–15)
BUN: 26 mg/dL — ABNORMAL HIGH (ref 6–20)
CO2: 36 mmol/L — ABNORMAL HIGH (ref 22–32)
Calcium: 9.5 mg/dL (ref 8.9–10.3)
Chloride: 92 mmol/L — ABNORMAL LOW (ref 98–111)
Creatinine, Ser: 0.4 mg/dL — ABNORMAL LOW (ref 0.44–1.00)
GFR calc Af Amer: >60 mL/min (ref >60)
GFR calc non Af Amer: >60 mL/min (ref >60)
Glucose, Bld: 131 mg/dL — ABNORMAL HIGH (ref 70–99)
Potassium: 4.8 mmol/L (ref 3.5–5.1)
Sodium: 139 mmol/L (ref 135–145)

## 2018-06-27 LAB — GLUCOSE, CAPILLARY
GLUCOSE-CAPILLARY: 101 mg/dL — AB (ref 70–99)
Glucose-Capillary: 130 mg/dL — ABNORMAL HIGH (ref 70–99)
Glucose-Capillary: 81 mg/dL (ref 70–99)
Glucose-Capillary: 87 mg/dL (ref 70–99)
Glucose-Capillary: 93 mg/dL (ref 70–99)

## 2018-06-27 LAB — CBC WITH DIFFERENTIAL/PLATELET
ABS IMMATURE GRANULOCYTES: 0.03 10*3/uL (ref 0.00–0.07)
BASOS ABS: 0 10*3/uL (ref 0.0–0.1)
Basophils Relative: 0 %
Eosinophils Absolute: 0 10*3/uL (ref 0.0–0.5)
Eosinophils Relative: 0 %
HCT: 42.5 % (ref 36.0–46.0)
Hemoglobin: 12.2 g/dL (ref 12.0–15.0)
IMMATURE GRANULOCYTES: 0 %
Lymphocytes Relative: 11 %
Lymphs Abs: 0.8 10*3/uL (ref 0.7–4.0)
MCH: 25.3 pg — ABNORMAL LOW (ref 26.0–34.0)
MCHC: 28.7 g/dL — ABNORMAL LOW (ref 30.0–36.0)
MCV: 88 fL (ref 80.0–100.0)
Monocytes Absolute: 0.3 10*3/uL (ref 0.1–1.0)
Monocytes Relative: 4 %
NEUTROS ABS: 6 10*3/uL (ref 1.7–7.7)
Neutrophils Relative %: 85 %
Platelets: 185 10*3/uL (ref 150–400)
RBC: 4.83 MIL/uL (ref 3.87–5.11)
RDW: 17.1 % — ABNORMAL HIGH (ref 11.5–15.5)
WBC: 7.1 10*3/uL (ref 4.0–10.5)
nRBC: 0 % (ref 0.0–0.2)

## 2018-06-27 LAB — CULTURE, BLOOD (ROUTINE X 2)
Culture: NO GROWTH
Culture: NO GROWTH
SPECIAL REQUESTS: ADEQUATE

## 2018-06-27 LAB — PHOSPHORUS: Phosphorus: 3.1 mg/dL (ref 2.5–4.6)

## 2018-06-27 LAB — MAGNESIUM: Magnesium: 2.1 mg/dL (ref 1.7–2.4)

## 2018-06-27 MED ORDER — SODIUM CHLORIDE 0.9% FLUSH
3.0000 mL | Freq: Two times a day (BID) | INTRAVENOUS | Status: DC
  Administered 2018-06-27 – 2018-07-04 (×13): 3 mL via INTRAVENOUS

## 2018-06-27 MED ORDER — SODIUM CHLORIDE 0.9% FLUSH: 3.0000 mL | INTRAVENOUS | Status: DC | PRN

## 2018-06-27 MED ORDER — SODIUM CHLORIDE 0.9 % IV SOLN: 250.0000 mL | INTRAVENOUS | Status: DC | PRN

## 2018-06-27 MED ORDER — OXYCODONE-ACETAMINOPHEN 7.5-325 MG PO TABS: 1.0000 | ORAL_TABLET | ORAL | Status: DC | PRN

## 2018-06-27 MED ORDER — PANTOPRAZOLE SODIUM 40 MG PO PACK
40.0000 mg | PACK | Freq: Every day | ORAL | Status: DC
  Administered 2018-06-27 – 2018-06-29 (×2): 40 mg
  Filled 2018-06-27 (×3): qty 20

## 2018-06-27 MED ORDER — FUROSEMIDE 20 MG PO TABS
20.0000 mg | ORAL_TABLET | Freq: Every day | ORAL | Status: DC
  Administered 2018-06-27 – 2018-07-04 (×8): 20 mg via ORAL
  Filled 2018-06-27 (×8): qty 1

## 2018-06-27 MED ORDER — HYDRALAZINE HCL 25 MG PO TABS
25.0000 mg | ORAL_TABLET | Freq: Three times a day (TID) | ORAL | Status: DC
  Administered 2018-06-27 – 2018-07-05 (×24): 25 mg via ORAL
  Filled 2018-06-27 (×24): qty 1

## 2018-06-27 MED ORDER — AMLODIPINE BESYLATE 10 MG PO TABS
10.0000 mg | ORAL_TABLET | Freq: Every day | ORAL | Status: DC
  Administered 2018-06-27 – 2018-07-04 (×8): 10 mg via ORAL
  Filled 2018-06-27 (×8): qty 1

## 2018-06-27 NOTE — Progress Notes (Signed)
RT NOTES: Patient transferred to 2W. Report given to 2W RT.

## 2018-06-27 NOTE — Progress Notes (Signed)
NAME:  Vanessa Wallace, MRN:  [id number], DOB:  Nov 26, 1955, LOS: 5 ADMISSION DATE:  06/21/2018, CONSULTATION DATE:  12/819 REFERRING MD:  Dr. Tana Coast, MD CHIEF COMPLAINT:  Altered mental status, respiratory acidosis  Brief History    62 year old female with severe emphysema with chronic hypercapnic and hypoxemic respiratory failure transferred from stepdown unit for persistent respiratory acidosis and worsening mental status refractory to BiPAP.  Transferred to PCCM for management of acute on chronic hypercapnic and hypoxic respiratory failure requiring intubation.  Past Medical History  COPD, Chronic hypoxemic and hypercapneic respiratory failure, Chronic diastolic heart failure  Significant Hospital Events   12/7 Admitted 12/8 PCCM 06/25/2018 palliative care meeting with full DNR per patient's request.. 06/26/2018 started on low-dose morphine 06/27/2018 transfer to floor to try and service  Consults:  PCCM > 12/8  Procedures:  ETT 12/8 > 06/24/2018  Significant Diagnostic Tests:  ABG 12/7 00:37 7.3/69/123 ABG 12/7 09:00 7.26/81/103  Micro Data:  BCx 12/7 > no growth at this time>> Sputum > never done  Antimicrobials:  Levaquin 12/8 > 06/26/2018  Interim history/subjective:  Met with palliative care 06/25/2018 is full DNR does not want to be intubated per her request. 06/26/2017 start low-dose morphine and anxiolytics to control her air hunger We will need to transfer her to a stepdown unit in the near future  Objective   Blood pressure (!) 176/91, pulse 81, temperature 98.8 F (37.1 C), temperature source Oral, resp. rate (!) 23, height _0  (1.575 m), weight 42.7 kg, SpO2 95 %.        Intake/Output Summary (Last 24 hours) at 06/27/2018 1117 Last data filed at 06/27/2018 1000 Gross per 24 hour  Intake 1118.85 ml  Output 1305 ml  Net -186.15 ml   Filed Weights   06/22/18 0500 06/25/18 0500 06/26/18 1130  Weight: 42.6 kg 42.8 kg 42.7 kg    Examination: General:  Frail cachectic female who is comfortable on intermittent morphine HEENT: No JVD or lymphadenopathy is appreciated Neuro: Dull affect but otherwise alert CV: Heart sounds are regular PULM: Creased air movement WL:SLHT, non-tender, bsx4 active  Extremities: warm/dry, negative edema  Skin: no rashes or lesions    Resolved Hospital Problem list     Assessment & Plan:   Acute on chronic hypercapneic and hypoxemic respiratory failure Severe Emphysema -DC BiPAP -Changing to wean steroids currently at 40 mg IV every 12, will decrease to 40 IV daily within the next 2 days. -Antibiotics completed   Altered mental status due to metabolic encephalopathy. Likely related to hypercapneia. -Much more comfortable on morphine and Klonopin but less interactive  Chronic diastolic heart failure (grade 1). LVEF 50-55% . Euvolemic on exam, but oliguric.  -Diuresis as tolerated  Hyperkalemia Continue diuresis Monitor potassium  Anxiety -Low-dose morphine -Continue Klonopin  Hypotension: likely related to sedatives. Hypertensive at baseline. RESOLVED.  Currently resolved   Goals of Care : Full DNR.  Met with palliative care 06/25/2018 per her wishes does not want to be reintubated -DC BiPAP -Morphine -PRN anxiolytics  Best practice:  Diet: Taking diet  Pain/Anxiety/Delirium protocol .  She has been started on low-dose morphine for dyspnea and anxiety along with other anxiolytics with more of a comfort approach rather than curative approach VAP protocol (if indicated): yes DVT prophylaxis: Lovenox GI prophylaxis: PPI Glucose control: None Mobility: Bed rest Code Status: DNR, full DNR.  She met with palliative care and per her own wishes we will want to be intubated. Family Communication: 06/2018 patient family  updated at bedside. Disposition: 06/27/2018 transfer to [facility] service for 06/28/2018  Labs   CBC: Recent Labs  Lab 06/21/18 2339 06/22/18 0419 06/23/18 0419  06/24/18 0329 06/25/18 0508 06/27/18 0341  WBC 7.0 6.9 7.0 4.9 8.1 7.1  NEUTROABS 5.5 6.4  --   --   --  6.0  HGB 12.3 11.3* 10.4* 10.8* 11.5* 12.2  HCT 43.7 39.0 36.4 38.2 39.0 42.5  MCV 93.6 91.5 89.2 89.9 90.1 88.0  PLT 229 216 226 166 159 837    Basic Metabolic Panel: Recent Labs  Lab 06/23/18 0419 06/24/18 0329 06/25/18 0508 06/26/18 0210 06/27/18 0341  NA 140 141 142 140 139  K 5.0 5.1 5.1 5.3* 4.8  CL 101 100 98 96* 92*  CO2 30 34* 35* 37* 36*  GLUCOSE 130* 98 91 123* 131*  BUN 39* 40* 33* 30* 26*  CREATININE 1.30* 0.89 0.51 0.60 0.40*  CALCIUM 8.8* 9.3 9.2 9.2 9.5  MG  --  2.5* 2.2 2.2 2.1  PHOS  --  4.3  --  3.2 3.1   GFR: Estimated Creatinine Clearance: 50.4 mL/min (A) (by C-G formula based on SCr of 0.4 mg/dL (L)). Recent Labs  Lab 06/23/18 0419 06/24/18 0329 06/25/18 0508 06/27/18 0341  WBC 7.0 4.9 8.1 7.1    Liver Function Tests: Recent Labs  Lab 06/21/18 2339  AST 62*  ALT 46*  ALKPHOS 66  BILITOT 0.7  PROT 8.1  ALBUMIN 3.7   No results for input(s): LIPASE, AMYLASE in the last 168 hours. No results for input(s): AMMONIA in the last 168 hours.  ABG    Component Value Date/Time   PHART 7.301 (L) 06/25/2018 0318   PCO2ART 77.3 (HH) 06/25/2018 0318   PO2ART 76.0 (L) 06/25/2018 0318   HCO3 38.6 (H) 06/25/2018 0318   TCO2 41 (H) 06/25/2018 0318   ACIDBASEDEF 2.0 08/08/2017 0512   O2SAT 94.0 06/25/2018 0318     Coagulation Profile: No results for input(s): INR, PROTIME in the last 168 hours.  Cardiac Enzymes: No results for input(s): CKTOTAL, CKMB, CKMBINDEX, TROPONINI in the last 168 hours.  HbA1C: Hgb A1c MFr Bld  Date/Time Value Ref Range Status  08/30/2016 06:46 PM 5.5 4.8 - 5.6 % Final    Comment:    (NOTE)         Pre-diabetes: 5.7 - 6.4         Diabetes: >6.4         Glycemic control for adults with diabetes: <7.0     CBG: Recent Labs  Lab 06/26/18 0910 06/26/18 1301 06/26/18 1606 06/26/18 2035 06/27/18 0758    GLUCAP 75 135* 154* 119* 101*            Steve Molli Gethers ACNP [facility] PCCM Pager [phone] till 1 pm If no answer page [phone] 06/27/2018, 11:17 AM

## 2018-06-27 NOTE — Evaluation (Signed)
Physical Therapy Evaluation Patient Details Name: Vanessa Wallace MRN: [id number] DOB: 1955-11-07 Today's Date: 06/27/2018   History of Present Illness  62 y.o. female admitted 06/21/18 with SOB. Intubated on 12/8 and extubated on 12/10. PMH: COPD with chronic hypoxic and hypercarbic respiratory failure, chronic diastolic CHF, drug abuse, depression with anxiety, and hypertension   Clinical Impression  Pt admitted with above diagnosis. Pt currently with functional limitations due to the deficits listed below (see PT Problem List). Pt limited eval as pt WOB at rest 4/4.  Pt was slid down in bed therefore assisted pt to sitting and she stood and moved to [facility].  Left pt sitting in better position in bed.  Pt has fair strength and didn't need a lot of physical assist.  Pt dyspnea at rest significant which limits pt with RR to 41 with minimal activity.  Pt was on 6LO2 with Sats >94%. Other VSS.  Will follow acutely.   Pt will benefit from skilled PT to increase their independence and safety with mobility to allow discharge to the venue listed below.      Follow Up Recommendations SNF;Supervision/Assistance - 24 hour    Equipment Recommendations  Other (comment)(TBA)    Recommendations for Other Services       Precautions / Restrictions Precautions Precautions: Fall Restrictions Weight Bearing Restrictions: No      Mobility  Bed Mobility Overal bed mobility: Needs Assistance Bed Mobility: Supine to Sit     Supine to sit: Min assist     General bed mobility comments: Slight assist with elevation of trunk  Transfers Overall transfer level: Needs assistance Equipment used: 2 person hand held assist Transfers: Sit to/from Stand Sit to Stand: Min assist;+2 safety/equipment         General transfer comment: Needed +2 HHA to stand and take a few steps to [facility]. Unsteady without UE support  Ambulation/Gait           Gait velocity interpretation: <1.31 ft/sec, indicative of  household ambulator General Gait Details: unable.  WOB at rest 4/4.   Stairs            Wheelchair Mobility    Modified Rankin (Stroke Patients Only)       Balance Overall balance assessment: Needs assistance Sitting-balance support: No upper extremity supported;Feet supported Sitting balance-Leahy Scale: Fair     Standing balance support: Bilateral upper extremity supported;During functional activity Standing balance-Leahy Scale: Poor Standing balance comment: relies on UE support                             Pertinent Vitals/Pain Pain Assessment: No/denies pain(Simultaneous filing. User may not have seen previous data.)    Home Living Family/patient expects to be discharged to:: Private residence Living Arrangements: Spouse/significant other;Children Available Help at Discharge: Family;Available 24 hours/day Type of Home: House Home Access: Stairs to enter Entrance Stairs-Rails: Doctor, general practice of Steps: 4 Home Layout: One level Home Equipment: Walker - 2 wheels      Prior Function Level of Independence: Needs assistance   Gait / Transfers Assistance Needed: Pt states she walked with RW wtih min assist  ADL's / Homemaking Assistance Needed: unsure  Comments: Pt a questionable historian.       Hand Dominance        Extremity/Trunk Assessment   Upper Extremity Assessment Upper Extremity Assessment: Defer to OT evaluation    Lower Extremity Assessment Lower Extremity Assessment: Generalized weakness  Cervical / Trunk Assessment Cervical / Trunk Assessment: Kyphotic  Communication   Communication: No difficulties  Cognition Arousal/Alertness: Lethargic Behavior During Therapy: Flat affect;Anxious Overall Cognitive Status: Within Functional Limits for tasks assessed                                        General Comments      Exercises     Assessment/Plan    PT Assessment Patient needs  continued PT services  PT Problem List Decreased activity tolerance;Decreased balance;Decreased mobility;Decreased knowledge of use of DME;Decreased safety awareness;Decreased knowledge of precautions;Cardiopulmonary status limiting activity       PT Treatment Interventions DME instruction;Gait training;Functional mobility training;Therapeutic activities;Therapeutic exercise;Balance training;Patient/family education    PT Goals (Current goals can be found in the Care Plan section)  Acute Rehab PT Goals Patient Stated Goal: unable to state PT Goal Formulation: With patient Time For Goal Achievement: 07/11/18 Potential to Achieve Goals: Good    Frequency Min 3X/week   Barriers to discharge        Co-evaluation               AM-PAC PT "6 Clicks" Mobility  Outcome Measure Help needed turning from your back to your side while in a flat bed without using bedrails?: A Lot Help needed moving from lying on your back to sitting on the side of a flat bed without using bedrails?: A Lot Help needed moving to and from a bed to a chair (including a wheelchair)?: A Lot Help needed standing up from a chair using your arms (e.g., wheelchair or bedside chair)?: A Lot Help needed to walk in hospital room?: A Lot Help needed climbing 3-5 steps with a railing? : A Lot 6 Click Score: 12    End of Session Equipment Utilized During Treatment: Gait belt;Oxygen Activity Tolerance: Patient limited by fatigue Patient left: in bed;with call bell/phone within reach;with bed alarm set Nurse Communication: Mobility status PT Visit Diagnosis: Muscle weakness (generalized) (M62.81);Unsteadiness on feet (R26.81)    Time: [id number] PT Time Calculation (min) (ACUTE ONLY): 17 min   Charges:   PT Evaluation $PT Eval Moderate Complexity: 1 Mod          Fraya Ueda,PT Acute Rehabilitation Services Pager:  [phone]  Office:  [phone]    Berline LopesDawn F Hilarie Sinha 06/27/2018, 1:17 PM

## 2018-06-27 NOTE — Progress Notes (Signed)
  Speech Language Pathology Treatment: Dysphagia  Patient Details Name: Vanessa FordVivian A Wallace MRN: [id number] DOB: 02/18/1956 Today's Date: 06/27/2018 Time: 1210-1218 SLP Time Calculation (min) (ACUTE ONLY): 8 min  Assessment / Plan / Recommendation Clinical Impression  Pt was observed with minimal intake today as she was very distracted by her need to use the restroom. Pt was assisted onto the bedpan, but did consume several sips of thin liquid under skilled observation of SLP. No overt signs of aspiration were observed, but she does continue to have oral holding in the setting of shortness of breath. Pt declined any solids offered, but suspect that chopped textures remain most appropriate from a respiratory standpoint. Will continue to follow for potential advancement.   HPI HPI: 62 y.o. female admitted 06/21/18 with SOB. PMH: COPD with chronic hypoxic and hypercarbic respiratory failure, chronic diastolic CHF, drug abuse, depression with anxiety, and hypertension      SLP Plan  Continue with current plan of care       Recommendations  Diet recommendations: Dysphagia 2 (fine chop);Thin liquid Liquids provided via: Straw Medication Administration: Whole meds with liquid Supervision: Patient able to self feed;Full supervision/cueing for compensatory strategies Compensations: Small sips/bites;Slow rate(take frequent breaks to control SOB) Postural Changes and/or Swallow Maneuvers: Seated upright 90 degrees                Oral Care Recommendations: Oral care BID Follow up Recommendations: None SLP Visit Diagnosis: Dysphagia, unspecified (R13.10) Plan: Continue with current plan of care       GO                Maxcine Hamaiewonsky, Teodor Prater 06/27/2018, 1:15 PM  Maxcine HamLaura Paiewonsky, M.A. CCC-SLP Acute Herbalistehabilitation Services Pager [phone] Office [phone]

## 2018-06-28 LAB — CBC
HCT: 43.2 % (ref 36.0–46.0)
Hemoglobin: 12.8 g/dL (ref 12.0–15.0)
MCH: 25.8 pg — ABNORMAL LOW (ref 26.0–34.0)
MCHC: 29.6 g/dL — ABNORMAL LOW (ref 30.0–36.0)
MCV: 86.9 fL (ref 80.0–100.0)
Platelets: 165 10*3/uL (ref 150–400)
RBC: 4.97 MIL/uL (ref 3.87–5.11)
RDW: 17 % — ABNORMAL HIGH (ref 11.5–15.5)
WBC: 5.2 10*3/uL (ref 4.0–10.5)
nRBC: 0 % (ref 0.0–0.2)

## 2018-06-28 LAB — GLUCOSE, CAPILLARY
Glucose-Capillary: 124 mg/dL — ABNORMAL HIGH (ref 70–99)
Glucose-Capillary: 87 mg/dL (ref 70–99)
Glucose-Capillary: 144 mg/dL — ABNORMAL HIGH (ref 70–99)
Glucose-Capillary: 152 mg/dL — ABNORMAL HIGH (ref 70–99)

## 2018-06-28 LAB — BASIC METABOLIC PANEL
Anion gap: 10 (ref 5–15)
BUN: 18 mg/dL (ref 6–20)
CO2: 37 mmol/L — ABNORMAL HIGH (ref 22–32)
Calcium: 9.7 mg/dL (ref 8.9–10.3)
Chloride: 90 mmol/L — ABNORMAL LOW (ref 98–111)
Creatinine, Ser: 0.5 mg/dL (ref 0.44–1.00)
GFR calc Af Amer: >60 mL/min (ref >60)
GFR calc non Af Amer: >60 mL/min (ref >60)
Glucose, Bld: 107 mg/dL — ABNORMAL HIGH (ref 70–99)
POTASSIUM: 4.8 mmol/L (ref 3.5–5.1)
Sodium: 137 mmol/L (ref 135–145)

## 2018-06-28 NOTE — Progress Notes (Addendum)
Daily Progress Note   Patient Name: Vanessa Wallace       Date: 06/28/2018 DOB: [birth year]  Age: 62 y.o. MRN#: [id number] Attending Physician: Zannie Cove, MD Primary Care Physician: Regino Bellow, MD Admit Date: 06/21/2018  Reason for Consultation/Follow-up: Establishing goals of care  Subjective: [name] is lying in bed quietly resting.  She is answering my questions with "yes "and "no ".  I asked if she was feeling better and she nodded yes.  In order to clarify specific goals of care I asked her about using BiPAP again in the future she said no both verbally and by shaking her head.  I asked Chester at bedside about using BiPAP again in the future.  Annia Friendly confirmed "she does not want that ".  Annia Friendly stated please give her breathing treatments and oxygen but she does not want the mask again.   Assessment: Chronically ill female with end-stage COPD and polysubstance abuse.  She is benzodiazepine dependent with severe anxiety disorder.  She is currently resting comfortably on nasal cannula.   Patient Profile/HPI:  62 y.o.femalewith past medical history of COPD, D-HF, tobacco and cocaine abuse, anxiety, depression and arthritiswho was admitted on 12/7/2019with shortness of breath. She was found to be in hypercapnic respiratory failure and required BiPAP. Unfortunately the patient has been not been tolerating the BiPAP well and has pulled it off. When she does her oxygen saturation plummets into the 70s. Patient had a similar admission in Sept 2019. Her code status at that time was DNR.  Her echocardiogram in September showed pulmonary artery arterial pressure of 67 mmhg with diastolic HF grade 1 and an EF of 55%. She saw Dr. Isaiah Serge outpatient on 06/17/2018. She has had an  abnormal CT with biapical soft tissue densities and is due for a follow up PET scan for further evaluation. Dr. Isaiah Serge felt her severe anxiety may prevent her from tolerating a PET scan. He recommended Palliative discussions be continued.    Length of Stay: 6  Current Medications: Scheduled Meds:  . amLODipine  10 mg Oral Daily  . aspirin  81 mg Oral Daily  . chlorhexidine  15 mL Mouth Rinse BID  . clonazepam  0.5 mg Oral BID  . DULoxetine  60 mg Oral Daily  . enoxaparin (LOVENOX) injection  30 mg Subcutaneous Q24H  .  furosemide  20 mg Oral Daily  . hydrALAZINE  25 mg Oral Q8H  . ipratropium-albuterol  3 mL Nebulization TID  . mouth rinse  15 mL Mouth Rinse q12n4p  . methylPREDNISolone (SOLU-MEDROL) injection  40 mg Intravenous Q12H  . pantoprazole sodium  40 mg Per Tube QHS  . sodium chloride flush  3 mL Intravenous Q12H  . sodium chloride flush  3 mL Intravenous Q12H  . sodium chloride flush  3 mL Intravenous Q12H    Continuous Infusions: . sodium chloride    . sodium chloride      PRN Meds: sodium chloride, sodium chloride, acetaminophen **OR** acetaminophen, albuterol, LORazepam, morphine CONCENTRATE, ondansetron **OR** ondansetron (ZOFRAN) IV, oxyCODONE-acetaminophen, sodium chloride flush, sodium chloride flush  Physical Exam        Thin frail chronically ill-appearing female.  Sleepy but opens her eyes and answers yes/no questions.  Annia FriendlyChester is at bedside CV regular rate and rhythm Respiratory decreased breath sounds, slightly stunted/abrupt respirations Abdomen thin firm nondistended  Vital Signs: BP (!) 169/79 (BP Location: Right Arm)   Pulse 92   Temp 98.3 F (36.8 C) (Oral)   Resp 20   Ht 5\' 2"  (1.575 m)   Wt 42.7 kg   SpO2 97%   BMI 17.22 kg/m  SpO2: SpO2: 97 % O2 Device: O2 Device: Nasal Cannula O2 Flow Rate: O2 Flow Rate (L/min): 4 L/min  Intake/output summary:   Intake/Output Summary (Last 24 hours) at 06/28/2018 1330 Last data filed at  06/28/2018 1000 Gross per 24 hour  Intake 360 ml  Output 300 ml  Net 60 ml   LBM: Last BM Date: (PTA) Baseline Weight: Weight: 47 kg Most recent weight: Weight: 42.7 kg       Palliative Assessment/Data: 40%    Flowsheet Rows     Most Recent Value  Intake Tab  Referral Department  Hospitalist  Unit at Time of Referral  ER  Palliative Care Primary Diagnosis  Pulmonary  Date Notified  06/22/18  Palliative Care Type  New Palliative care  Reason for referral  Clarify Goals of Care  Date of Admission  06/21/18  Date first seen by Palliative Care  06/22/18  # of days Palliative referral response time  0 Day(s)  # of days IP prior to Palliative referral  1  Clinical Assessment  Psychosocial & Spiritual Assessment  Palliative Care Outcomes      Patient Active Problem List   Diagnosis Date Noted  . COPD with acute exacerbation (HCC) 06/22/2018  . Anxiety 06/22/2018  . Transaminasemia 06/22/2018  . Essential hypertension   . Acute respiratory failure with hypercapnia (HCC)   . Polysubstance abuse (HCC)   . Palliative care encounter   . Fluid overload 03/25/2018  . Protein-calorie malnutrition, severe 08/12/2017  . Protein-calorie malnutrition, moderate (HCC) 08/08/2017  . Iron deficiency anemia 08/08/2017  . Depression 08/08/2017  . Acute on chronic respiratory failure with hypoxia and hypercapnia (HCC) 08/08/2017  . COPD exacerbation (HCC) 08/30/2016  . COPD (chronic obstructive pulmonary disease) (HCC) 08/30/2016  . Asthma exacerbation 10/02/2014  . Renal insufficiency   . Chest wall pain   . Cough   . Cocaine use 09/22/2013  . Pneumonia 09/17/2013  . Community acquired pneumonia 09/17/2013    Palliative Care Plan    Recommendations/Plan:  Patient is DNR/DNI, no BiPAP.  Treat reversible illness with noninvasive measures  Offer medications for comfort including morphine for dyspnea and benzodiazepines for anxiety  Hopeful for discharge to skilled nursing  facility .  Outpatient palliative care follow-up recommended  If she declines again in the hospital she will need comfort measures.    PMT will continue to chart check.  Please do not hesitate to call us directly if we are needed [phone]   Code Status:  DNR  Prognosis:  Poor prognosis given recurrent hypercapnia and respiratory failure.  At this point her family still wants her to be able to return to the hospital for treatment if needed.  Discharge Planning:  Skilled Nursing Facility for rehab with Palliative care service follow-up  Care plan was discussed with Dr. Jomarie Longs, patient, left message for Sister Salli Real.  Patient's son Molly Maduro defers medical surrogate decisions to his aunt Salli Real  Thank you for allowing the Palliative Medicine Team to assist in the care of this patient.  Total time spent:  35 minutes     Greater than 50%  of this time was spent counseling and coordinating care related to the above assessment and plan.  Norvel Richards, PA-C Palliative Medicine  Please contact Palliative MedicineTeam phone at [phone] for questions and concerns between 7 am - 7 pm.   Please see AMION for individual provider pager numbers.

## 2018-06-28 NOTE — Evaluation (Signed)
Occupational Therapy Evaluation Patient Details Name: Vanessa Wallace MRN: [id number] DOB: 1955-09-01 Today's Date: 06/28/2018    History of Present Illness 62 y.o. female admitted 06/21/18 with SOB. Intubated on 12/8 and extubated on 12/10. PMH: COPD with chronic hypoxic and hypercarbic respiratory failure, chronic diastolic CHF, drug abuse, depression with anxiety, and hypertension    Clinical Impression   Patient supine in bed. Cleared by RN for participation.  Pt on 2L of supplemental oxygen via [place], oxygen saturations maintained > 92% but noted increased work of breath.  Limited verbal responses throughout session.  Pt requires min assist +2 for safety for stand pivot transfer, min assist for bed mobility and total assist for self care today (washing face, toileting care as bed was saturated). Repositioned in bed at completion of session.  Limited eval due to participation and activity tolerance, but patient will benefit from continued OT services while admitted and after dc at SNF level in order to optimize independence with ADLs.  Will continue to follow.     Follow Up Recommendations  SNF;Supervision/Assistance - 24 hour    Equipment Recommendations  3 in 1 bedside commode    Recommendations for Other Services       Precautions / Restrictions Precautions Precautions: Fall Restrictions Weight Bearing Restrictions: No      Mobility Bed Mobility Overal bed mobility: Needs Assistance Bed Mobility: Supine to Sit;Sit to Supine     Supine to sit: Min assist Sit to supine: Min assist   General bed mobility comments: min assist for trunk mgmt  Transfers Overall transfer level: Needs assistance Equipment used: None Transfers: Sit to/from Stand Sit to Stand: Min assist;+2 safety/equipment         General transfer comment: physical assist to reach full stand, cueing for safety and hand placement     Balance Overall balance assessment: Needs assistance Sitting-balance  support: No upper extremity supported;Feet supported Sitting balance-Leahy Scale: Fair     Standing balance support: Bilateral upper extremity supported;During functional activity Standing balance-Leahy Scale: Poor Standing balance comment: relies on UE support                           ADL either performed or assessed with clinical judgement   ADL Overall ADL's : Needs assistance/impaired     Grooming: Bed level;Total assistance Grooming Details (indicate cue type and reason): encouraged patient to wash face, patient requires total assist          Upper Body Dressing : Maximal assistance;Bed level       Toilet Transfer: Minimal assistance;+2 for safety/equipment;Stand-pivot Toilet Transfer Details (indicate cue type and reason): simulated to chair (while changing bed sheets) Toileting- Clothing Manipulation and Hygiene: Total assistance;+2 for safety/equipment;Sit to/from stand       Functional mobility during ADLs: Minimal assistance;+2 for safety/equipment General ADL Comments: pt limited during session by increased WOB but oxygen satruations maintained at 92-94%      Vision   Additional Comments: to be assessed     Perception     Praxis      Pertinent Vitals/Pain Pain Assessment: No/denies pain     Hand Dominance Right(reports "both")   Extremity/Trunk Assessment Upper Extremity Assessment Upper Extremity Assessment: Generalized weakness   Lower Extremity Assessment Lower Extremity Assessment: Defer to PT evaluation   Cervical / Trunk Assessment Cervical / Trunk Assessment: Kyphotic   Communication Communication Communication: Other (comment)(soft spoken and increased WOB limiting verbalizations )   Cognition Arousal/Alertness:  Lethargic Behavior During Therapy: Flat affect;Anxious Overall Cognitive Status: Within Functional Limits for tasks assessed                                 General Comments: pt with limited  verbalizations, focused on fan position during eval.  Limited responses, but oriented and able to follow 1 step commands    General Comments  RN present and assisted with mgmt of bedding change    Exercises     Shoulder Instructions      Home Living Family/patient expects to be discharged to:: Private residence Living Arrangements: Spouse/significant other;Children Available Help at Discharge: Family;Available 24 hours/day Type of Home: House Home Access: Stairs to enter [employer] of Steps: 4 Entrance Stairs-Rails: Right;Left Home Layout: One level     Bathroom Shower/Tub: Chief Strategy Officer: Standard     Home Equipment: Environmental consultant - 2 wheels   Additional Comments: pt with limited report to OT, per chart review/PT assessment       Prior Functioning/Environment Level of Independence: Needs assistance  Gait / Transfers Assistance Needed: Pt states she walked with RW wtih min assist ADL's / Homemaking Assistance Needed: unsure   Comments: per PT eval, patient with limited responses during eval         OT Problem List: Decreased strength;Decreased activity tolerance;Impaired balance (sitting and/or standing);Decreased safety awareness;Decreased knowledge of use of DME or AE;Decreased knowledge of precautions;Cardiopulmonary status limiting activity      OT Treatment/Interventions: Self-care/ADL training;Therapeutic exercise;Energy conservation;DME and/or AE instruction;Therapeutic activities;Patient/family education;Balance training    OT Goals(Current goals can be found in the care plan section) Acute Rehab OT Goals Patient Stated Goal: unable to state Time For Goal Achievement: 07/05/18 Potential to Achieve Goals: Good  OT Frequency: Min 2X/week   Barriers to D/C:            Co-evaluation              AM-PAC OT "6 Clicks" Daily Activity     Outcome Measure Help from another person eating meals?: A Lot Help from another person  taking care of personal grooming?: A Lot Help from another person toileting, which includes using toliet, bedpan, or urinal?: Total Help from another person bathing (including washing, rinsing, drying)?: A Lot Help from another person to put on and taking off regular upper body clothing?: A Lot Help from another person to put on and taking off regular lower body clothing?: Total 6 Click Score: 10   End of Session Equipment Utilized During Treatment: Oxygen Nurse Communication: Mobility status  Activity Tolerance: Patient limited by lethargy Patient left: in bed;with call bell/phone within reach;with bed alarm set;with nursing/sitter in room  OT Visit Diagnosis: Other abnormalities of gait and mobility (R26.89)                Time: 1336-1401 OT Time Calculation (min): 25 min Charges:  OT General Charges $OT Visit: 1 Visit OT Evaluation $OT Eval Moderate Complexity: 1 Mod OT Treatments $Self Care/Home Management : 8-22 mins  Chancy Milroy, OT Acute Rehabilitation Services Pager [phone] Office [phone]   Chancy Milroy 06/28/2018, 2:17 PM

## 2018-06-28 NOTE — Progress Notes (Signed)
Patient is confused, agitated and restless. Keeps climbing out of bed, taking oxygen off and very anxiuos due to decreased oxygen. Difficult to redirect. On call provider notified and order for safety sitter initiated.

## 2018-06-28 NOTE — Plan of Care (Signed)
  Problem: Education: Goal: Knowledge of General Education information will improve Description: Including pain rating scale, medication(s)/side effects and non-pharmacologic comfort measures Outcome: Not Progressing   Problem: Health Behavior/Discharge Planning: Goal: Ability to manage health-related needs will improve Outcome: Not Progressing   Problem: Clinical Measurements: Goal: Will remain free from infection Outcome: Not Progressing   

## 2018-06-28 NOTE — Progress Notes (Signed)
PROGRESS NOTE    Vanessa FordVivian A Wallace  NWG:[id number] DOB: Feb 28, 1956 DOA: 06/21/2018 PCP: Regino Bellowamos, Arlene G, MD  Brief Narrative: 62 year old female with end-stage COPD/chronic hypoxemic and hypercarbic respiratory failure on home O2 was admitted to the ICU for persistent respiratory acidosis and worsening mental status. -She was treated with BiPAP initially subsequently intubated due to worsening respiratory status. -Subsequently extubated,  -Underwent palliative care meeting and now is a full DNR per patient request -On further discussions with palliative MD decisions made for no BiPAP for worsening respiratory distress or hypercarbia and consideration of comfort care when she declines next -Transferred from PCCM to [facility] service today 12/14  Assessment & Plan:     Acute on chronic respiratory failure with hypoxia and hypercapnia (HCC) -Treated with BiPAP followed by mechanical ventilation -Due to COPD exacerbation -Has end-stage COPD with chronic hypoxemic and hypercarbic respiratory failure -Completed antibiotic course -Now on IV steroids, nebulizations -Status post palliative care meeting and extensive discussions with pulmonary MD, DNR and no BiPAP, pulmonary has recommended comfort care when she deteriorates further which would be consistent with her stated wishes -Dysphagia 2 diet -Continue oral Lasix -Continue low-dose morphine and benzodiazepine for comfort  Metabolic encephalopathy -Due to hypercarbia -Now improved -Continue low-dose morphine and Klonopin for comfort  Chronic diastolic CHF -Echo with EF of 57%55% -Continue low-dose oral Lasix  Anxiety -Continue Klonopin and PRN low-dose morphine  Goals of care -DNR, no intubation, palliative following, taken off BiPAP -Morphine and anxiolytics as needed -Pulmonary recommended no BiPAP when deteriorates next except for comfort  DVT prophylaxis: Lovenox Code Status: DNR Family Communication: No family at bedside Disposition  Plan: To be determined would be most appropriate for hospice  Consultants:   PCCM   Procedures:   Antimicrobials:    Subjective: -Continues to be dyspneic at rest -Trying to eat a banana  Objective: Vitals:   06/27/18 2235 06/28/18 0003 06/28/18 0736 06/28/18 0807  BP:  (!) 162/84 (!) 169/79   Pulse:  95 92   Resp:  (!) 22 20   Temp:  97.7 F (36.5 C) 98.3 F (36.8 C)   TempSrc:  Oral Oral   SpO2: 92% 98% 100% 97%  Weight:      Height:        Intake/Output Summary (Last 24 hours) at 06/28/2018 1105 Last data filed at 06/28/2018 1000 Gross per 24 hour  Intake 372.86 ml  Output 650 ml  Net -277.14 ml   Filed Weights   06/22/18 0500 06/25/18 0500 06/26/18 1130  Weight: 42.6 kg 42.8 kg 42.7 kg    Examination:  Gen: Chronically ill cachectic female in mild distress alert awake oriented x2 HEENT: PERRLA, Neck supple, no JVD Lungs: Poor air movement bilaterally, no expiratory wheezes CVS: S1-S2/regular rate rhythm Abd: soft, Non tender, non distended, BS present Extremities: No edema Skin: no new rashes  Data Reviewed:   CBC: Recent Labs  Lab 06/21/18 2339 06/22/18 0419 06/23/18 0419 06/24/18 0329 06/25/18 0508 06/27/18 0341 06/28/18 0312  WBC 7.0 6.9 7.0 4.9 8.1 7.1 5.2  NEUTROABS 5.5 6.4  --   --   --  6.0  --   HGB 12.3 11.3* 10.4* 10.8* 11.5* 12.2 12.8  HCT 43.7 39.0 36.4 38.2 39.0 42.5 43.2  MCV 93.6 91.5 89.2 89.9 90.1 88.0 86.9  PLT 229 216 226 166 159 185 165   Basic Metabolic Panel: Recent Labs  Lab 06/24/18 0329 06/25/18 0508 06/26/18 0210 06/27/18 0341 06/28/18 0312  NA 141 142 140  139 137  K 5.1 5.1 5.3* 4.8 4.8  CL 100 98 96* 92* 90*  CO2 34* 35* 37* 36* 37*  GLUCOSE 98 91 123* 131* 107*  BUN 40* 33* 30* 26* 18  CREATININE 0.89 0.51 0.60 0.40* 0.50  CALCIUM 9.3 9.2 9.2 9.5 9.7  MG 2.5* 2.2 2.2 2.1  --   PHOS 4.3  --  3.2 3.1  --    GFR: Estimated Creatinine Clearance: 50.4 mL/min (by C-G formula based on SCr of 0.5  mg/dL). Liver Function Tests: Recent Labs  Lab 06/21/18 2339  AST 62*  ALT 46*  ALKPHOS 66  BILITOT 0.7  PROT 8.1  ALBUMIN 3.7   No results for input(s): LIPASE, AMYLASE in the last 168 hours. No results for input(s): AMMONIA in the last 168 hours. Coagulation Profile: No results for input(s): INR, PROTIME in the last 168 hours. Cardiac Enzymes: No results for input(s): CKTOTAL, CKMB, CKMBINDEX, TROPONINI in the last 168 hours. BNP (last 3 results) No results for input(s): PROBNP in the last 8760 hours. HbA1C: No results for input(s): HGBA1C in the last 72 hours. CBG: Recent Labs  Lab 06/27/18 1640 06/27/18 1940 06/27/18 2337 06/28/18 0536 06/28/18 0739  GLUCAP 130* 93 81 124* 87   Lipid Profile: No results for input(s): CHOL, HDL, LDLCALC, TRIG, CHOLHDL, LDLDIRECT in the last 72 hours. Thyroid Function Tests: No results for input(s): TSH, T4TOTAL, FREET4, T3FREE, THYROIDAB in the last 72 hours. Anemia Panel: No results for input(s): VITAMINB12, FOLATE, FERRITIN, TIBC, IRON, RETICCTPCT in the last 72 hours. Urine analysis: No results found for: COLORURINE, APPEARANCEUR, LABSPEC, PHURINE, GLUCOSEU, HGBUR, BILIRUBINUR, KETONESUR, PROTEINUR, UROBILINOGEN, NITRITE, LEUKOCYTESUR Sepsis Labs: @LABRCNTIP (procalcitonin:4,lacticidven:4)  ) Recent Results (from the past 240 hour(s))  Blood culture (routine x 2)     Status: None   Collection Time: 06/21/18 11:15 PM  Result Value Ref Range Status   Specimen Description BLOOD LEFT ARM  Final   Special Requests   Final    BOTTLES DRAWN AEROBIC AND ANAEROBIC Blood Culture results may not be optimal due to an excessive volume of blood received in culture bottles   Culture   Final    NO GROWTH 5 DAYS Performed at [facility], [address], [place], [place] [address]    Report Status 06/27/2018 FINAL  Final  Blood culture (routine x 2)     Status: None   Collection Time: 06/21/18 11:35 PM  Result Value Ref Range  Status   Specimen Description BLOOD LEFT HAND  Final   Special Requests   Final    BOTTLES DRAWN AEROBIC ONLY Blood Culture adequate volume   Culture   Final    NO GROWTH 5 DAYS Performed at [facility], [address] [place], [place] [address]    Report Status 06/27/2018 FINAL  Final  MRSA PCR Screening     Status: Abnormal   Collection Time: 06/22/18  3:34 AM  Result Value Ref Range Status   MRSA by PCR POSITIVE (A) NEGATIVE Final    Comment:        The GeneXpert MRSA Assay (FDA approved for NASAL specimens only), is one component of a comprehensive MRSA colonization surveillance program. It is not intended to diagnose MRSA infection nor to guide or monitor treatment for MRSA infections. RESULT CALLED TO, READ BACK BY AND VERIFIED WITH: TSOUTIS,J RN [phone] 06/22/18 MITCHELL,L          Radiology Studies: No results found.  Scheduled Meds: . amLODipine  10 mg Oral Daily  . aspirin  81 mg Oral Daily  . chlorhexidine  15 mL Mouth Rinse BID  . clonazepam  0.5 mg Oral BID  . DULoxetine  60 mg Oral Daily  . enoxaparin (LOVENOX) injection  30 mg Subcutaneous Q24H  . furosemide  20 mg Oral Daily  . hydrALAZINE  25 mg Oral Q8H  . ipratropium-albuterol  3 mL Nebulization TID  . mouth rinse  15 mL Mouth Rinse q12n4p  . methylPREDNISolone (SOLU-MEDROL) injection  40 mg Intravenous Q12H  . pantoprazole sodium  40 mg Per Tube QHS  . sodium chloride flush  3 mL Intravenous Q12H  . sodium chloride flush  3 mL Intravenous Q12H  . sodium chloride flush  3 mL Intravenous Q12H   Continuous Infusions: . sodium chloride    . sodium chloride       LOS: 6 days    Time spent:    Zannie Cove, MD [facility] Page via [email or web address], password [email or web address] After 7PM please contact night-coverage  06/28/2018, 11:05 AM

## 2018-06-29 LAB — CREATININE, SERUM
CREATININE: 0.44 mg/dL (ref 0.44–1.00)
GFR calc Af Amer: >60 mL/min (ref >60)
GFR calc non Af Amer: >60 mL/min (ref >60)

## 2018-06-29 LAB — GLUCOSE, CAPILLARY
Glucose-Capillary: 274 mg/dL — ABNORMAL HIGH (ref 70–99)
Glucose-Capillary: 252 mg/dL — ABNORMAL HIGH (ref 70–99)

## 2018-06-29 MED ORDER — INSULIN ASPART 100 UNIT/ML ~~LOC~~ SOLN
0.0000 [IU] | Freq: Three times a day (TID) | SUBCUTANEOUS | Status: DC
  Administered 2018-06-29: 2 [IU] via SUBCUTANEOUS
  Administered 2018-06-30: 3 [IU] via SUBCUTANEOUS
  Administered 2018-07-01 – 2018-07-02 (×3): 2 [IU] via SUBCUTANEOUS
  Administered 2018-07-02: 1 [IU] via SUBCUTANEOUS
  Administered 2018-07-03: 2 [IU] via SUBCUTANEOUS
  Administered 2018-07-03 – 2018-07-04 (×2): 5 [IU] via SUBCUTANEOUS
  Administered 2018-07-04: 9 [IU] via SUBCUTANEOUS
  Administered 2018-07-04: 7 [IU] via SUBCUTANEOUS

## 2018-06-29 NOTE — Progress Notes (Signed)
PROGRESS NOTE    Vanessa Wallace  WUJ:[id number] DOB: 12/16/1955 DOA: 06/21/2018 PCP: Regino Bellow, MD  Brief Narrative: 62 year old female with end-stage COPD/chronic hypoxemic and hypercarbic respiratory failure on home O2 was admitted to the ICU for persistent respiratory acidosis and worsening mental status. -She was treated with BiPAP initially subsequently intubated due to worsening respiratory status. -Subsequently extubated,  -Underwent palliative care meeting and now is a full DNR per patient request -On further discussions with palliative MD decisions made for no BiPAP for worsening respiratory distress or hypercarbia and consideration of comfort care when she declines next -Transferred from PCCM to [facility] service 12/14  Assessment & Plan:     Acute on chronic respiratory failure with hypoxia and hypercapnia (HCC) -Treated with BiPAP followed by mechanical ventilation -Due to COPD exacerbation -Has end-stage COPD with chronic hypoxemic and hypercarbic respiratory failure -Completed antibiotic course -Now on IV steroids, nebulizations -significant symptom burden remains -Status post palliative care meeting and extensive discussions with pulmonary MD, DNR and no BiPAP, pulmonary has recommended comfort care when she deteriorates further  -s/p Palliative meeting yesterday, family want readmission and Rx for same, plan for Palliative care FU at SNF -Dysphagia 2 diet -Continue oral Lasix -Continue low-dose morphine and benzodiazepine for comfort  Metabolic encephalopathy -Due to hypercarbia -Now improved -Continue low-dose morphine and Klonopin for comfort  Chronic diastolic CHF -Echo with EF of 95% -Continue low-dose oral Lasix  Anxiety -Continue Klonopin and PRN low-dose morphine  Goals of care -DNR, no intubation, palliative following, taken off BiPAP -Morphine and anxiolytics as needed -Pulmonary recommended no BiPAP when deteriorates next except for comfort  DVT  prophylaxis: Lovenox Code Status: DNR Family Communication: No family at bedside Disposition Plan: To be determined would be most appropriate for hospice  Consultants:   PCCM   Procedures:   Antimicrobials:    Subjective: -continues to have shortness of breath  Objective: Vitals:   06/29/18 0740 06/29/18 0821 06/29/18 0822 06/29/18 1327  BP: (!) 165/89  115/69   Pulse: 86  67 68  Resp:    16  Temp: 98.5 F (36.9 C)  98.2 F (36.8 C)   TempSrc:      SpO2: 92% 94% 98%   Weight:      Height:        Intake/Output Summary (Last 24 hours) at 06/29/2018 1343 Last data filed at 06/29/2018 0000 Gross per 24 hour  Intake -  Output 1 ml  Net -1 ml   Filed Weights   06/22/18 0500 06/25/18 0500 06/26/18 1130  Weight: 42.6 kg 42.8 kg 42.7 kg    Examination:  Gen: cachectic chronically ill female, mild distress, AAOx2  HEENT: PERRLA, Neck supple, no JVD Lungs: very poor air movement bilaterally, CTAB CVS: RRR,No Gallops,Rubs or new Murmurs Abd: soft, Non tender, non distended, BS present Extremities: No edema Skin: no new rashes  Data Reviewed:   CBC: Recent Labs  Lab 06/23/18 0419 06/24/18 0329 06/25/18 0508 06/27/18 0341 06/28/18 0312  WBC 7.0 4.9 8.1 7.1 5.2  NEUTROABS  --   --   --  6.0  --   HGB 10.4* 10.8* 11.5* 12.2 12.8  HCT 36.4 38.2 39.0 42.5 43.2  MCV 89.2 89.9 90.1 88.0 86.9  PLT 226 166 159 185 165   Basic Metabolic Panel: Recent Labs  Lab 06/24/18 0329 06/25/18 0508 06/26/18 0210 06/27/18 0341 06/28/18 0312 06/29/18 0539  NA 141 142 140 139 137  --   K 5.1 5.1 5.3*  4.8 4.8  --   CL 100 98 96* 92* 90*  --   CO2 34* 35* 37* 36* 37*  --   GLUCOSE 98 91 123* 131* 107*  --   BUN 40* 33* 30* 26* 18  --   CREATININE 0.89 0.51 0.60 0.40* 0.50 0.44  CALCIUM 9.3 9.2 9.2 9.5 9.7  --   MG 2.5* 2.2 2.2 2.1  --   --   PHOS 4.3  --  3.2 3.1  --   --    GFR: Estimated Creatinine Clearance: 50.4 mL/min (by C-G formula based on SCr of 0.44  mg/dL). Liver Function Tests: No results for input(s): AST, ALT, ALKPHOS, BILITOT, PROT, ALBUMIN in the last 168 hours. No results for input(s): LIPASE, AMYLASE in the last 168 hours. No results for input(s): AMMONIA in the last 168 hours. Coagulation Profile: No results for input(s): INR, PROTIME in the last 168 hours. Cardiac Enzymes: No results for input(s): CKTOTAL, CKMB, CKMBINDEX, TROPONINI in the last 168 hours. BNP (last 3 results) No results for input(s): PROBNP in the last 8760 hours. HbA1C: No results for input(s): HGBA1C in the last 72 hours. CBG: Recent Labs  Lab 06/28/18 0739 06/28/18 1133 06/28/18 1702 06/29/18 0735 06/29/18 1148  GLUCAP 87 144* 152* 274* 252*   Lipid Profile: No results for input(s): CHOL, HDL, LDLCALC, TRIG, CHOLHDL, LDLDIRECT in the last 72 hours. Thyroid Function Tests: No results for input(s): TSH, T4TOTAL, FREET4, T3FREE, THYROIDAB in the last 72 hours. Anemia Panel: No results for input(s): VITAMINB12, FOLATE, FERRITIN, TIBC, IRON, RETICCTPCT in the last 72 hours. Urine analysis: No results found for: COLORURINE, APPEARANCEUR, LABSPEC, PHURINE, GLUCOSEU, HGBUR, BILIRUBINUR, KETONESUR, PROTEINUR, UROBILINOGEN, NITRITE, LEUKOCYTESUR Sepsis Labs: @LABRCNTIP (procalcitonin:4,lacticidven:4)  ) Recent Results (from the past 240 hour(s))  Blood culture (routine x 2)     Status: None   Collection Time: 06/21/18 11:15 PM  Result Value Ref Range Status   Specimen Description BLOOD LEFT ARM  Final   Special Requests   Final    BOTTLES DRAWN AEROBIC AND ANAEROBIC Blood Culture results may not be optimal due to an excessive volume of blood received in culture bottles   Culture   Final    NO GROWTH 5 DAYS Performed at [facility], [address], [place], [place] [address]    Report Status 06/27/2018 FINAL  Final  Blood culture (routine x 2)     Status: None   Collection Time: 06/21/18 11:35 PM  Result Value Ref Range Status   Specimen  Description BLOOD LEFT HAND  Final   Special Requests   Final    BOTTLES DRAWN AEROBIC ONLY Blood Culture adequate volume   Culture   Final    NO GROWTH 5 DAYS Performed at [facility], [address] [place], [place] [address]    Report Status 06/27/2018 FINAL  Final  MRSA PCR Screening     Status: Abnormal   Collection Time: 06/22/18  3:34 AM  Result Value Ref Range Status   MRSA by PCR POSITIVE (A) NEGATIVE Final    Comment:        The GeneXpert MRSA Assay (FDA approved for NASAL specimens only), is one component of a comprehensive MRSA colonization surveillance program. It is not intended to diagnose MRSA infection nor to guide or monitor treatment for MRSA infections. RESULT CALLED TO, READ BACK BY AND VERIFIED WITH: TSOUTIS,J RN [phone] 06/22/18 MITCHELL,L          Radiology Studies:  No results found.      Scheduled Meds: . amLODipine  10 mg Oral Daily  . aspirin  81 mg Oral Daily  . chlorhexidine  15 mL Mouth Rinse BID  . clonazepam  0.5 mg Oral BID  . DULoxetine  60 mg Oral Daily  . enoxaparin (LOVENOX) injection  30 mg Subcutaneous Q24H  . furosemide  20 mg Oral Daily  . hydrALAZINE  25 mg Oral Q8H  . ipratropium-albuterol  3 mL Nebulization TID  . mouth rinse  15 mL Mouth Rinse q12n4p  . methylPREDNISolone (SOLU-MEDROL) injection  40 mg Intravenous Q12H  . pantoprazole sodium  40 mg Per Tube QHS  . sodium chloride flush  3 mL Intravenous Q12H  . sodium chloride flush  3 mL Intravenous Q12H  . sodium chloride flush  3 mL Intravenous Q12H   Continuous Infusions: . sodium chloride    . sodium chloride       LOS: 7 days    Time spent: 35min    Zannie CovePreetha Nolton Denis, MD [facility] Page via [email or web address], password [email or web address] After 7PM please contact night-coverage  06/29/2018, 1:43 PM

## 2018-06-30 LAB — GLUCOSE, CAPILLARY
GLUCOSE-CAPILLARY: 101 mg/dL — AB (ref 70–99)
Glucose-Capillary: 115 mg/dL — ABNORMAL HIGH (ref 70–99)
Glucose-Capillary: 126 mg/dL — ABNORMAL HIGH (ref 70–99)
Glucose-Capillary: 136 mg/dL — ABNORMAL HIGH (ref 70–99)
Glucose-Capillary: 137 mg/dL — ABNORMAL HIGH (ref 70–99)
Glucose-Capillary: 166 mg/dL — ABNORMAL HIGH (ref 70–99)
Glucose-Capillary: 190 mg/dL — ABNORMAL HIGH (ref 70–99)
Glucose-Capillary: 204 mg/dL — ABNORMAL HIGH (ref 70–99)
Glucose-Capillary: 89 mg/dL (ref 70–99)

## 2018-06-30 MED ORDER — IPRATROPIUM-ALBUTEROL 0.5-2.5 (3) MG/3ML IN SOLN
3.0000 mL | Freq: Two times a day (BID) | RESPIRATORY_TRACT | Status: DC
  Administered 2018-06-30 – 2018-07-05 (×10): 3 mL via RESPIRATORY_TRACT
  Filled 2018-06-30 (×10): qty 3

## 2018-06-30 MED ORDER — PANTOPRAZOLE SODIUM 40 MG PO TBEC
40.0000 mg | DELAYED_RELEASE_TABLET | Freq: Every day | ORAL | Status: DC
  Administered 2018-06-30 – 2018-07-04 (×5): 40 mg via ORAL
  Filled 2018-06-30 (×5): qty 1

## 2018-06-30 NOTE — Progress Notes (Signed)
PT Cancellation Note  Patient Details Name: Vanessa Wallace MRN: [id number] DOB: [birth year]   Cancelled Treatment:    Reason Eval/Treat Not Completed: Fatigue/lethargy limiting ability to participate;Patient's level of consciousness(Chart reviewed, treatment attempted. Pt sleeping heavily upon arrival, open bag of Skittles scattered across tray, 1 (purple) in lap. Pt rouses to loud verbal stimulus after several attempts, but continues to appear lethargic. Attempted questioning, but pt with very heavily slurred speech, difficult to understand. Pt continually falls asleep during questioning. Will continue to follow remotely and attempt treatment again at later date/time once patient is able to participate.   2:52 PM, 06/30/18 Rosamaria LintsAllan C Estrella Alcaraz, PT, DPT Physical Therapist - [place] [phone] (Pager)  [phone] (Office)      [place] C 06/30/2018, 2:50 PM

## 2018-06-30 NOTE — Progress Notes (Signed)
  Speech Language Pathology Treatment: Dysphagia  Patient Details Name: Vanessa FordVivian A Cedrone MRN: [id number] DOB: 1956/02/06 Today's Date: 06/30/2018 Time: [id number] SLP Time Calculation (min) (ACUTE ONLY): 13 min  Assessment / Plan / Recommendation Clinical Impression  Pt was drowsy but awakened easily and requested something to drink. She consumed thin liquids with immediate inhalation noted often post-swallow, concerning for reduced coordination of breath/swallow, but without overt signs of aspiration. Of note, pt had regular-textured foods (candy, popcorn, sandwiches, chips) scattered around her table. Per RN, family has been bringing in outside food not within pt's recommended diet textures. Pt continues to decline solid foods offered to allow for reassessment and readiness to advance diet. She remains short of breath and would benefit from softer textures, and she verbalizes her preference for them as well, stating that she could not eat meat if it came any more solid; however, per RN, she is also eating minimal POs (from her tray but also from food family tries to bring). SLP removed regular textures from within pt's reach for the time being. Will continue to follow for potential readiness to advance.   HPI HPI: 62 y.o. female admitted 06/21/18 with SOB. PMH: COPD with chronic hypoxic and hypercarbic respiratory failure, chronic diastolic CHF, drug abuse, depression with anxiety, and hypertension      SLP Plan  Continue with current plan of care       Recommendations  Diet recommendations: Dysphagia 2 (fine chop);Thin liquid Liquids provided via: Straw Medication Administration: Whole meds with liquid Supervision: Patient able to self feed;Full supervision/cueing for compensatory strategies Compensations: Small sips/bites;Slow rate(take frequent rest breaks for respirations) Postural Changes and/or Swallow Maneuvers: Seated upright 90 degrees                Oral Care  Recommendations: Oral care BID Follow up Recommendations: None SLP Visit Diagnosis: Dysphagia, unspecified (R13.10) Plan: Continue with current plan of care       GO                Maxcine Hamaiewonsky, Kaysey Berndt 06/30/2018, 4:00 PM  Maxcine HamLaura Paiewonsky, M.A. CCC-SLP Acute Herbalist[facility]abilitation Services Pager [phone] Office [phone]

## 2018-06-30 NOTE — [place] FL2 (Signed)
[place] MEDICAID FL2 LEVEL OF CARE SCREENING TOOL     IDENTIFICATION  Patient Name: Vanessa Wallace Birthdate: August 22, 1955 Sex: female Admission Date (Current Location): 06/21/2018  [facility] and [place] Number:  Producer, television/film/video and Address:  [facility], [address], [place], [place] [address]      Provider Number: [id number]  Attending Physician Name and Address:  Zannie Cove, MD  Relative Name and Phone Number:       Current Level of Care: Hospital Recommended Level of Care: Skilled Nursing Facility Prior Approval Number:    Date Approved/Denied:   PASRR Number:    Discharge Plan: SNF    Current Diagnoses: Patient Active Problem List   Diagnosis Date Noted  . COPD with acute exacerbation (HCC) 06/22/2018  . Anxiety 06/22/2018  . Transaminasemia 06/22/2018  . Essential hypertension   . Acute respiratory failure with hypercapnia (HCC)   . Polysubstance abuse (HCC)   . Palliative care encounter   . Fluid overload 03/25/2018  . Protein-calorie malnutrition, severe 08/12/2017  . Protein-calorie malnutrition, moderate (HCC) 08/08/2017  . Iron deficiency anemia 08/08/2017  . Depression 08/08/2017  . Acute on chronic respiratory failure with hypoxia and hypercapnia (HCC) 08/08/2017  . COPD exacerbation (HCC) 08/30/2016  . COPD (chronic obstructive pulmonary disease) (HCC) 08/30/2016  . Asthma exacerbation 10/02/2014  . Renal insufficiency   . Chest wall pain   . Cough   . Cocaine use 09/22/2013  . Pneumonia 09/17/2013  . Community acquired pneumonia 09/17/2013    Orientation RESPIRATION BLADDER Height & Weight     Self, Place  O2(Nasal Cannula 2L) External catheter, Incontinent(placed 12/13) Weight: 94 lb 2.2 oz (42.7 kg) Height:  5\' 2"  (157.5 cm)  BEHAVIORAL SYMPTOMS/MOOD NEUROLOGICAL BOWEL NUTRITION STATUS      Incontinent Diet(DYS 2 diet, thin liquids)  AMBULATORY STATUS COMMUNICATION OF NEEDS Skin   Extensive Assist  Verbally Normal                       Personal Care Assistance Level of Assistance  Bathing, Feeding, Dressing Bathing Assistance: Maximum assistance Feeding assistance: Limited assistance Dressing Assistance: Maximum assistance     Functional Limitations Info  Sight, Hearing, Speech Sight Info: Adequate Hearing Info: Adequate Speech Info: Adequate    SPECIAL CARE FACTORS FREQUENCY  PT (By licensed PT), OT (By licensed OT)     PT Frequency: 3x OT Frequency: 3x            Contractures Contractures Info: Not present    Additional Factors Info  Code Status, Allergies Code Status Info: DNR Allergies Info: NO known allergies           Current Medications (06/30/2018):  This is the current hospital active medication list Current Facility-Administered Medications  Medication Dose Route Frequency Provider Last Rate Last Dose  . 0.9 %  sodium chloride infusion  250 mL Intravenous PRN Minor, Vilinda Blanks, NP      . 0.9 %  sodium chloride infusion  250 mL Intravenous PRN Minor, Vilinda Blanks, NP      . acetaminophen (TYLENOL) tablet 650 mg  650 mg Oral Q6H PRN Minor, Vilinda Blanks, NP       Or  . acetaminophen (TYLENOL) suppository 650 mg  650 mg Rectal Q6H PRN Minor, Vilinda Blanks, NP      . albuterol (PROVENTIL) (2.5 MG/3ML) 0.083% nebulizer solution 2.5 mg  2.5 mg Nebulization Q4H PRN Minor, Vilinda Blanks, NP      .  amLODipine (NORVASC) tablet 10 mg  10 mg Oral Daily Minor, Vilinda BlanksWilliam S, NP   10 mg at 06/30/18 1028  . aspirin chewable tablet 81 mg  81 mg Oral Daily Minor, Vilinda BlanksWilliam S, NP   81 mg at 06/30/18 1028  . chlorhexidine (PERIDEX) 0.12 % solution 15 mL  15 mL Mouth Rinse BID Minor, Vilinda BlanksWilliam S, NP   15 mL at 06/30/18 1027  . clonazePAM (KLONOPIN) disintegrating tablet 0.5 mg  0.5 mg Oral BID Minor, Vilinda BlanksWilliam S, NP   0.5 mg at 06/30/18 1029  . DULoxetine (CYMBALTA) DR capsule 60 mg  60 mg Oral Daily Minor, Vilinda BlanksWilliam S, NP   60 mg at 06/30/18 1029  . enoxaparin (LOVENOX) injection 30 mg  30  mg Subcutaneous Q24H Minor, Vilinda BlanksWilliam S, NP   30 mg at 06/30/18 1027  . furosemide (LASIX) tablet 20 mg  20 mg Oral Daily Minor, Vilinda BlanksWilliam S, NP   20 mg at 06/30/18 1028  . hydrALAZINE (APRESOLINE) tablet 25 mg  25 mg Oral Q8H Minor, Vilinda BlanksWilliam S, NP   25 mg at 06/30/18 0551  . insulin aspart (novoLOG) injection 0-9 Units  0-9 Units Subcutaneous TID WC Zannie CoveJoseph, Preetha, MD   2 Units at 06/29/18 1805  . ipratropium-albuterol (DUONEB) 0.5-2.5 (3) MG/3ML nebulizer solution 3 mL  3 mL Nebulization BID Zannie CoveJoseph, Preetha, MD      . LORazepam (ATIVAN) injection 0.5 mg  0.5 mg Intravenous Q6H PRN Minor, Vilinda BlanksWilliam S, NP   0.5 mg at 06/29/18 2200  . MEDLINE mouth rinse  15 mL Mouth Rinse q12n4p Minor, Vilinda BlanksWilliam S, NP   15 mL at 06/28/18 1100  . methylPREDNISolone sodium succinate (SOLU-MEDROL) 40 mg/mL injection 40 mg  40 mg Intravenous Q12H Minor, Vilinda BlanksWilliam S, NP   40 mg at 06/30/18 0829  . morphine CONCENTRATE 10 MG/0.5ML oral solution 2.6 mg  2.6 mg Oral Q2H PRN Minor, Vilinda BlanksWilliam S, NP   2.6 mg at 06/30/18 0830  . ondansetron (ZOFRAN) tablet 4 mg  4 mg Oral Q6H PRN Minor, Vilinda BlanksWilliam S, NP       Or  . ondansetron (ZOFRAN) injection 4 mg  4 mg Intravenous Q6H PRN Minor, Vilinda BlanksWilliam S, NP      . oxyCODONE-acetaminophen (PERCOCET) 7.5-325 MG per tablet 1-2 tablet  1-2 tablet Oral Q4H PRN Minor, Vilinda BlanksWilliam S, NP      . pantoprazole sodium (PROTONIX) 40 mg/20 mL oral suspension 40 mg  40 mg Per Tube QHS Minor, Vilinda BlanksWilliam S, NP   40 mg at 06/29/18 2121  . sodium chloride flush (NS) 0.9 % injection 3 mL  3 mL Intravenous Q12H Minor, Vilinda BlanksWilliam S, NP   3 mL at 06/27/18 0944  . sodium chloride flush (NS) 0.9 % injection 3 mL  3 mL Intravenous Q12H Minor, Vilinda BlanksWilliam S, NP   3 mL at 06/29/18 1000  . sodium chloride flush (NS) 0.9 % injection 3 mL  3 mL Intravenous PRN Minor, Vilinda BlanksWilliam S, NP      . sodium chloride flush (NS) 0.9 % injection 3 mL  3 mL Intravenous Q12H Minor, Vilinda BlanksWilliam S, NP   3 mL at 06/30/18 1032  . sodium chloride flush (NS) 0.9 % injection  3 mL  3 mL Intravenous PRN Minor, Vilinda BlanksWilliam S, NP         Discharge Medications: Please see discharge summary for a list of discharge medications.  Relevant Imaging Results:  Relevant Lab Results:   Additional Information SSN: [id number]  Maree KrabbeBridget A Giavonna Pflum, LCSW

## 2018-06-30 NOTE — Progress Notes (Signed)
PROGRESS NOTE    Vanessa FordVivian A Wallace  ZOX:[id number] DOB: [birth year] DOA: 06/21/2018 PCP: Regino Bellowamos, Arlene G, MD  Brief Narrative: 62 year old female with end-stage COPD/chronic hypoxemic and hypercarbic respiratory failure on home O2 was admitted to the ICU for persistent respiratory acidosis and worsening mental status. -She was treated with BiPAP initially subsequently intubated due to worsening respiratory status. -Subsequently extubated,  -Underwent palliative care meeting and now is a full DNR per patient request -On further discussions with palliative MD decisions made for no BiPAP for worsening respiratory distress or hypercarbia and consideration of comfort care when she declines next -[facility]  Assessment & Plan:   End stage COPD/Acute on chronic respiratory failure with hypoxia and hypercapnia (HCC) -Treated with BiPAP followed by mechanical ventilation -Has end-stage COPD with chronic hypoxemic and hypercarbic respiratory failure -[facility], respiratory status remains very tenuous -She has completed antibiotic course, remains on IV steroids, not ready for transition to oral steroids yet -Status post palliative care meeting and extensive discussions with pulmonary MD, DNR and no BiPAP, pulmonary has recommended comfort care when she deteriorates further  -s/p Palliative meeting, family want readmission and Rx for same, plan for Palliative care FU at SNF -Dysphagia 2 diet -Continue oral Lasix -Continue low-dose morphine and benzodiazepine for comfort -try to to ambulate, get out of bed  Metabolic encephalopathy -Due to hypercarbia -Now improved -Continue low-dose morphine and Klonopin for comfort  Chronic diastolic CHF -Echo with EF of 81%55% -Continue low-dose oral Lasix  Anxiety -Continue Klonopin and PRN low-dose morphine  Goals of care -DNR, no intubation, palliative following, taken off BiPAP -Morphine and  anxiolytics as needed -Pulmonary recommended no BiPAP when deteriorates next except for comfort  DVT prophylaxis: Lovenox Code Status: DNR Family Communication: No family at bedside Disposition Plan: To be determined would be most appropriate for hospice, wants to go home but not ready for home hospice yet  Consultants:   PCCM   Procedures:   Antimicrobials:    Subjective: -Needs to be profoundly short of breath with minimal activity dyspneic at rest  Objective: Vitals:   06/29/18 2210 06/30/18 0551 06/30/18 0805 06/30/18 0841  BP:  (!) 155/96  133/75  Pulse:    97  Resp:      Temp:    (!) 97.5 F (36.4 C)  TempSrc:    Oral  SpO2: 95%  (!) 89% 91%  Weight:      Height:        Intake/Output Summary (Last 24 hours) at 06/30/2018 1318 Last data filed at 06/29/2018 2118 Gross per 24 hour  Intake 3 ml  Output -  Net 3 ml   Filed Weights   06/22/18 0500 06/25/18 0500 06/26/18 1130  Weight: 42.6 kg 42.8 kg 42.7 kg    Examination: Gen: Cachectic chronically ill-appearing, mild respiratory distress, alert awake oriented x2 HEENT: PERRLA, Neck supple, no JVD Lungs: Very poor air movement bilaterally, rare expiratory wheezes CVS: RRR,No Gallops,Rubs or new Murmurs Abd: soft, Non tender, non distended, BS present Extremities: No edema Skin: no new rashes  Data Reviewed:   CBC: Recent Labs  Lab 06/24/18 0329 06/25/18 0508 06/27/18 0341 06/28/18 0312  WBC 4.9 8.1 7.1 5.2  NEUTROABS  --   --  6.0  --   HGB 10.8* 11.5* 12.2 12.8  HCT 38.2 39.0 42.5 43.2  MCV 89.9 90.1 88.0 86.9  PLT 166 159 185 165   Basic Metabolic Panel: Recent Labs  Lab 06/24/18 0329 06/25/18 0508 06/26/18 0210 06/27/18 0341 06/28/18 0312 06/29/18 0539  NA 141 142 140 139 137  --   K 5.1 5.1 5.3* 4.8 4.8  --   CL 100 98 96* 92* 90*  --   CO2 34* 35* 37* 36* 37*  --   GLUCOSE 98 91 123* 131* 107*  --   BUN 40* 33* 30* 26* 18  --   CREATININE 0.89 0.51 0.60 0.40* 0.50 0.44    CALCIUM 9.3 9.2 9.2 9.5 9.7  --   MG 2.5* 2.2 2.2 2.1  --   --   PHOS 4.3  --  3.2 3.1  --   --    GFR: Estimated Creatinine Clearance: 50.4 mL/min (by C-G formula based on SCr of 0.44 mg/dL). Liver Function Tests: No results for input(s): AST, ALT, ALKPHOS, BILITOT, PROT, ALBUMIN in the last 168 hours. No results for input(s): LIPASE, AMYLASE in the last 168 hours. No results for input(s): AMMONIA in the last 168 hours. Coagulation Profile: No results for input(s): INR, PROTIME in the last 168 hours. Cardiac Enzymes: No results for input(s): CKTOTAL, CKMB, CKMBINDEX, TROPONINI in the last 168 hours. BNP (last 3 results) No results for input(s): PROBNP in the last 8760 hours. HbA1C: No results for input(s): HGBA1C in the last 72 hours. CBG: Recent Labs  Lab 06/29/18 1711 06/30/18 0010 06/30/18 0429 06/30/18 0846 06/30/18 1241  GLUCAP 190* 136* 115* 89 204*   Lipid Profile: No results for input(s): CHOL, HDL, LDLCALC, TRIG, CHOLHDL, LDLDIRECT in the last 72 hours. Thyroid Function Tests: No results for input(s): TSH, T4TOTAL, FREET4, T3FREE, THYROIDAB in the last 72 hours. Anemia Panel: No results for input(s): VITAMINB12, FOLATE, FERRITIN, TIBC, IRON, RETICCTPCT in the last 72 hours. Urine analysis: No results found for: COLORURINE, APPEARANCEUR, LABSPEC, PHURINE, GLUCOSEU, HGBUR, BILIRUBINUR, KETONESUR, PROTEINUR, UROBILINOGEN, NITRITE, LEUKOCYTESUR Sepsis Labs: @LABRCNTIP (procalcitonin:4,lacticidven:4)  ) Recent Results (from the past 240 hour(s))  Blood culture (routine x 2)     Status: None   Collection Time: 06/21/18 11:15 PM  Result Value Ref Range Status   Specimen Description BLOOD LEFT ARM  Final   Special Requests   Final    BOTTLES DRAWN AEROBIC AND ANAEROBIC Blood Culture results may not be optimal due to an excessive volume of blood received in culture bottles   Culture   Final    NO GROWTH 5 DAYS Performed at [facility], [address]  [place], [place] [address]    Report Status 06/27/2018 FINAL  Final  Blood culture (routine x 2)     Status: None   Collection Time: 06/21/18 11:35 PM  Result Value Ref Range Status   Specimen Description BLOOD LEFT HAND  Final   Special Requests   Final    BOTTLES DRAWN AEROBIC ONLY Blood Culture adequate volume   Culture   Final    NO GROWTH 5 DAYS Performed at [facility], [address], [place], [place] [address]    Report Status 06/27/2018 FINAL  Final  MRSA PCR Screening     Status: Abnormal   Collection Time: 06/22/18  3:34 AM  Result Value Ref Range Status   MRSA by PCR POSITIVE (A) NEGATIVE Final    Comment:        The GeneXpert MRSA Assay (FDA approved for NASAL specimens only), is one component of a comprehensive MRSA colonization surveillance program. It is not intended to diagnose MRSA infection nor to guide or monitor  treatment for MRSA infections. RESULT CALLED TO, READ BACK BY AND VERIFIED WITH: TSOUTIS,J RN [phone] 06/22/18 MITCHELL,L          Radiology Studies: No results found.      Scheduled Meds: . amLODipine  10 mg Oral Daily  . aspirin  81 mg Oral Daily  . chlorhexidine  15 mL Mouth Rinse BID  . clonazepam  0.5 mg Oral BID  . DULoxetine  60 mg Oral Daily  . enoxaparin (LOVENOX) injection  30 mg Subcutaneous Q24H  . furosemide  20 mg Oral Daily  . hydrALAZINE  25 mg Oral Q8H  . insulin aspart  0-9 Units Subcutaneous TID WC  . ipratropium-albuterol  3 mL Nebulization BID  . mouth rinse  15 mL Mouth Rinse q12n4p  . methylPREDNISolone (SOLU-MEDROL) injection  40 mg Intravenous Q12H  . pantoprazole sodium  40 mg Per Tube QHS  . sodium chloride flush  3 mL Intravenous Q12H  . sodium chloride flush  3 mL Intravenous Q12H  . sodium chloride flush  3 mL Intravenous Q12H   Continuous Infusions: . sodium chloride    . sodium chloride       LOS: 8 days    Time spent:    Zannie Cove, MD [facility] Page via  [email or web address], password [email or web address] After 7PM please contact night-coverage  06/30/2018, 1:18 PM

## 2018-07-01 LAB — GLUCOSE, CAPILLARY
Glucose-Capillary: 104 mg/dL — ABNORMAL HIGH (ref 70–99)
Glucose-Capillary: 121 mg/dL — ABNORMAL HIGH (ref 70–99)
Glucose-Capillary: 167 mg/dL — ABNORMAL HIGH (ref 70–99)
Glucose-Capillary: 168 mg/dL — ABNORMAL HIGH (ref 70–99)
Glucose-Capillary: 182 mg/dL — ABNORMAL HIGH (ref 70–99)
Glucose-Capillary: 90 mg/dL (ref 70–99)

## 2018-07-01 MED ORDER — METHYLPREDNISOLONE SODIUM SUCC 40 MG IJ SOLR
40.0000 mg | Freq: Two times a day (BID) | INTRAMUSCULAR | Status: AC
  Administered 2018-07-01: 40 mg via INTRAVENOUS
  Filled 2018-07-01: qty 1

## 2018-07-01 MED ORDER — PREDNISONE 20 MG PO TABS
50.0000 mg | ORAL_TABLET | Freq: Every day | ORAL | Status: DC
  Administered 2018-07-02 – 2018-07-05 (×5): 50 mg via ORAL
  Filled 2018-07-01 (×5): qty 2

## 2018-07-01 NOTE — Clinical Social Work Note (Signed)
CSW attempted to meet with pt at bedside to discuss option of SNF. However, pt was asleep and did not awake to CSW voice. CSW to try back later today.   [place] CityBridget Jaylina Wallace, [place]WA [phone]

## 2018-07-01 NOTE — Clinical Social Work Note (Signed)
Per MD pt is refusing SNF. Clinical Social Worker will sign off for now as social work intervention is no longer needed. Please consult us again if new need arises.   Clarisse GougeBridget A Nicodemus Denk 07/01/2018

## 2018-07-01 NOTE — Progress Notes (Addendum)
Physical Therapy Treatment Patient Details Name: Vanessa FordVivian A Spivey MRN: [id number] DOB: 1955/11/18 Today's Date: 07/01/2018    History of Present Illness 62 y.o. female admitted 06/21/18 with SOB. Intubated on 12/8 and extubated on 12/10. PMH: COPD with chronic hypoxic and hypercarbic respiratory failure, chronic diastolic CHF, drug abuse, depression with anxiety, and hypertension     PT Comments    Pt was more alert today, able to sit EOB with min assist, but poor pulmonary tolerance to mobility.  She is very weak.  DOE 3/4 during mobility and O2 had to be turned up to 4 L from 3 L as she was sating at 87% at rest and could maintain at 91% on 4 L with EOB mobility. Pt was unable to attempt standing due to reports of being too weak to try.  PT will continue to follow acutely for safe mobility progression  Follow Up Recommendations  Supervision for mobility/OOB;SNF(per CSW's note, pt refusing SNF, so max HH services)     Equipment Recommendations  None recommended by PT;Other (comment)(per pt she was on home O2 PTA, however, she is poor historian)    Recommendations for Other Services   NA     Precautions / Restrictions Precautions Precautions: Fall;Other (comment) Precaution Comments: monitor O2 sats.     Mobility  Bed Mobility Overal bed mobility: Needs Assistance Bed Mobility: Supine to Sit;Sit to Supine     Supine to sit: Min assist;HOB elevated Sit to supine: Min assist;HOB elevated   General bed mobility comments: Min assist mostly to support trunk during transition up and lift legs during transition back to supine.  DOE at rest in the bed 2/4 O2 sats at rest on 3 L 87%, turned up to 4 L during mobility and were 91%  Transfers                 General transfer comment: Pt did not feel strong enough to stand.         Balance Overall balance assessment: Needs assistance Sitting-balance support: Feet supported;Bilateral upper extremity supported Sitting  balance-Leahy Scale: Fair Sitting balance - Comments: close supervision EOB.  Pt workign hard to breathe in sitting DOE 3/4 O2 sats 4 L O2 [place] and sats 91%. Unable to sit for more than 5-6 mins due to fatigue and WOB with trunk unsupported.                                      Cognition Arousal/Alertness: Lethargic Behavior During Therapy: Flat affect Overall Cognitive Status: No family/caregiver present to determine baseline cognitive functioning                                 General Comments: No family present.  Pt asked about her home situation (different than what the chart says).  She was able to tell me her name and her birthday accurately, and became more alert as we moved to the EOB.              Pertinent Vitals/Pain Pain Assessment: No/denies pain           PT Goals (current goals can now be found in the care plan section) Acute Rehab PT Goals Patient Stated Goal: to eat something (she is hungry) Progress towards PT goals: Progressing toward goals    Frequency    Min 3X/week  PT Plan Current plan remains appropriate       AM-PAC PT "6 Clicks" Mobility   Outcome Measure  Help needed turning from your back to your side while in a flat bed without using bedrails?: A Little Help needed moving from lying on your back to sitting on the side of a flat bed without using bedrails?: A Little Help needed moving to and from a bed to a chair (including a wheelchair)?: A Lot Help needed standing up from a chair using your arms (e.g., wheelchair or bedside chair)?: A Lot Help needed to walk in hospital room?: A Lot Help needed climbing 3-5 steps with a railing? : Total 6 Click Score: 13    End of Session Equipment Utilized During Treatment: Oxygen(4L O2 [place]) Activity Tolerance: Patient limited by fatigue Patient left: in bed;with call bell/phone within reach;with bed alarm set Nurse Communication: Mobility status PT Visit Diagnosis:  Muscle weakness (generalized) (M62.81);Unsteadiness on feet (R26.81)     Time: 4098-1191 PT Time Calculation (min) (ACUTE ONLY): 21 min  Charges:  $Therapeutic Activity: 8-22 mins           Jaymie Misch B. Braian Tijerina, PT, DPT  Acute Rehabilitation [phone] pager #[phone] office            07/01/2018, 4:13 PM

## 2018-07-01 NOTE — Progress Notes (Signed)
PROGRESS NOTE    Vanessa Wallace  ZOX:[id number] DOB: 1955/08/25 DOA: 06/21/2018 PCP: Regino Bellow, MD  Brief Narrative: 62 year old female with end-stage COPD/chronic hypoxemic and hypercarbic respiratory failure on home O2 was admitted to the ICU for persistent respiratory acidosis and worsening mental status. -She was treated with BiPAP initially subsequently intubated due to worsening respiratory status. -Subsequently extubated,  -Underwent palliative care meeting and now is a full DNR per patient request -On further discussions with palliative MD decisions made for no BiPAP for worsening respiratory distress or hypercarbia and consideration of comfort care when she declines next -Transferred from PCCM to [facility] service 12/14, relative following, respiratory status remains very tenuous, not agreeable for home hospice yet  Assessment & Plan:   End stage COPD/Acute on chronic respiratory failure with hypoxia and hypercapnia (HCC) -Treated with BiPAP followed by mechanical ventilation -Has end-stage COPD with chronic hypoxemic and hypercarbic respiratory failure -Extubated and transferred to [facility] service, respiratory status remains very tenuous -She has completed antibiotic course, remains on IV steroids, is marginally better, will transition to prednisone taper  -Palliative medicine following we have had multiple discussions regarding chronic end-of-life lung disease problems  -She is most appropriate for hospice, not agreeable to this yet  -She is DNR now  -Started on low-dose morphine and benzos for comfort in ICU -PT following, remains dyspneic and in distress even at rest  Metabolic encephalopathy -Due to hypercarbia -Now improved -Continue low-dose morphine and Klonopin for comfort  Chronic diastolic CHF -Echo with EF of 81% -Continue low-dose oral Lasix  Severe protein calorie malnutrition -Encourage supplements as tolerated  Anxiety -Continue Klonopin and PRN low-dose  morphine  Goals of care -DNR, no intubation, palliative following, taken off BiPAP days ago -Morphine and anxiolytics as needed -Pulmonary recommended no BiPAP when deteriorates next except for comfort, however patient is not agreeable for hospice, wants BiPAP if needed -No pulmonary reserve and tenuous respiratory status  DVT prophylaxis: Lovenox Code Status: DNR Family Communication: Boyfriend Chester at bedside Disposition Plan: Declines SNF, to be determined would be most appropriate for hospice, wants to go home but not agreeable for home hospice yet  Consultants:   PCCM   Procedures:   Antimicrobials:    Subjective: -Continues to report dyspnea, feels marginally better compared to last 2 days  Objective: Vitals:   06/30/18 2200 07/01/18 0804 07/01/18 0819 07/01/18 1133  BP: 113/63  136/84 113/74  Pulse: 99  97 99  Resp: 16  20 16   Temp: 98.5 F (36.9 C)  97.8 F (36.6 C) 97.9 F (36.6 C)  TempSrc: Axillary  Oral Oral  SpO2: 96% 91% 95% 95%  Weight:      Height:       No intake or output data in the 24 hours ending 07/01/18 1357 Filed Weights   06/22/18 0500 06/25/18 0500 06/26/18 1130  Weight: 42.6 kg 42.8 kg 42.7 kg    Examination: Gen: Extremely cachectic chronically ill-appearing female in mild respiratory distress at rest in bed, alert awake oriented x2 HEENT: PERRLA, Neck supple, no JVD Lungs: Very poor air movement, no expiratory wheezes today CVS: RRR,No Gallops,Rubs or new Murmurs Abd: soft, Non tender, non distended, BS present Extremities: No edema Skin: no new rashes  Data Reviewed:   CBC: Recent Labs  Lab 06/25/18 0508 06/27/18 0341 06/28/18 0312  WBC 8.1 7.1 5.2  NEUTROABS  --  6.0  --   HGB 11.5* 12.2 12.8  HCT 39.0 42.5 43.2  MCV 90.1 88.0  86.9  PLT 159 185 165   Basic Metabolic Panel: Recent Labs  Lab 06/25/18 0508 06/26/18 0210 06/27/18 0341 06/28/18 0312 06/29/18 0539  NA 142 140 139 137  --   K 5.1 5.3* 4.8 4.8   --   CL 98 96* 92* 90*  --   CO2 35* 37* 36* 37*  --   GLUCOSE 91 123* 131* 107*  --   BUN 33* 30* 26* 18  --   CREATININE 0.51 0.60 0.40* 0.50 0.44  CALCIUM 9.2 9.2 9.5 9.7  --   MG 2.2 2.2 2.1  --   --   PHOS  --  3.2 3.1  --   --    GFR: Estimated Creatinine Clearance: 50.4 mL/min (by C-G formula based on SCr of 0.44 mg/dL). Liver Function Tests: No results for input(s): AST, ALT, ALKPHOS, BILITOT, PROT, ALBUMIN in the last 168 hours. No results for input(s): LIPASE, AMYLASE in the last 168 hours. No results for input(s): AMMONIA in the last 168 hours. Coagulation Profile: No results for input(s): INR, PROTIME in the last 168 hours. Cardiac Enzymes: No results for input(s): CKTOTAL, CKMB, CKMBINDEX, TROPONINI in the last 168 hours. BNP (last 3 results) No results for input(s): PROBNP in the last 8760 hours. HbA1C: No results for input(s): HGBA1C in the last 72 hours. CBG: Recent Labs  Lab 06/30/18 2005 06/30/18 2334 07/01/18 0340 07/01/18 0821 07/01/18 1133  GLUCAP 137* 126* 167* 90 168*   Lipid Profile: No results for input(s): CHOL, HDL, LDLCALC, TRIG, CHOLHDL, LDLDIRECT in the last 72 hours. Thyroid Function Tests: No results for input(s): TSH, T4TOTAL, FREET4, T3FREE, THYROIDAB in the last 72 hours. Anemia Panel: No results for input(s): VITAMINB12, FOLATE, FERRITIN, TIBC, IRON, RETICCTPCT in the last 72 hours. Urine analysis: No results found for: COLORURINE, APPEARANCEUR, LABSPEC, PHURINE, GLUCOSEU, HGBUR, BILIRUBINUR, KETONESUR, PROTEINUR, UROBILINOGEN, NITRITE, LEUKOCYTESUR Sepsis Labs: @LABRCNTIP (procalcitonin:4,lacticidven:4)  ) Recent Results (from the past 240 hour(s))  Blood culture (routine x 2)     Status: None   Collection Time: 06/21/18 11:15 PM  Result Value Ref Range Status   Specimen Description BLOOD LEFT ARM  Final   Special Requests   Final    BOTTLES DRAWN AEROBIC AND ANAEROBIC Blood Culture results may not be optimal due to an excessive  volume of blood received in culture bottles   Culture   Final    NO GROWTH 5 DAYS Performed at [facility], [address], [place], [place] [address]    Report Status 06/27/2018 FINAL  Final  Blood culture (routine x 2)     Status: None   Collection Time: 06/21/18 11:35 PM  Result Value Ref Range Status   Specimen Description BLOOD LEFT HAND  Final   Special Requests   Final    BOTTLES DRAWN AEROBIC ONLY Blood Culture adequate volume   Culture   Final    NO GROWTH 5 DAYS Performed at [facility], [address], [place], [place] [address]    Report Status 06/27/2018 FINAL  Final  MRSA PCR Screening     Status: Abnormal   Collection Time: 06/22/18  3:34 AM  Result Value Ref Range Status   MRSA by PCR POSITIVE (A) NEGATIVE Final    Comment:        The GeneXpert MRSA Assay (FDA approved for NASAL specimens only), is one component of a comprehensive MRSA colonization surveillance program. It is not intended to diagnose MRSA infection nor to guide or  monitor treatment for MRSA infections. RESULT CALLED TO, READ BACK BY AND VERIFIED WITH: TSOUTIS,J RN [phone] 06/22/18 MITCHELL,L          Radiology Studies: No results found.      Scheduled Meds: . amLODipine  10 mg Oral Daily  . aspirin  81 mg Oral Daily  . chlorhexidine  15 mL Mouth Rinse BID  . clonazepam  0.5 mg Oral BID  . DULoxetine  60 mg Oral Daily  . enoxaparin (LOVENOX) injection  30 mg Subcutaneous Q24H  . furosemide  20 mg Oral Daily  . hydrALAZINE  25 mg Oral Q8H  . insulin aspart  0-9 Units Subcutaneous TID WC  . ipratropium-albuterol  3 mL Nebulization BID  . mouth rinse  15 mL Mouth Rinse q12n4p  . methylPREDNISolone (SOLU-MEDROL) injection  40 mg Intravenous Q12H  . pantoprazole  40 mg Oral QHS  . [START ON 07/02/2018] predniSONE  50 mg Oral Q breakfast  . sodium chloride flush  3 mL Intravenous Q12H  . sodium chloride flush  3 mL Intravenous Q12H  . sodium chloride flush  3 mL  Intravenous Q12H   Continuous Infusions: . sodium chloride    . sodium chloride       LOS: 9 days    Time spent: 25min    Zannie CovePreetha Seleny Allbright, MD [facility] Page via [email or web address], password [email or web address] After 7PM please contact night-coverage  07/01/2018, 1:57 PM

## 2018-07-02 LAB — GLUCOSE, CAPILLARY
GLUCOSE-CAPILLARY: 149 mg/dL — AB (ref 70–99)
Glucose-Capillary: 149 mg/dL — ABNORMAL HIGH (ref 70–99)
Glucose-Capillary: 197 mg/dL — ABNORMAL HIGH (ref 70–99)
Glucose-Capillary: 162 mg/dL — ABNORMAL HIGH (ref 70–99)

## 2018-07-02 NOTE — Progress Notes (Signed)
Nutrition Follow-up  DOCUMENTATION CODES:   Severe malnutrition in context of chronic illness  INTERVENTION:    Magic cup TID with meals, each supplement provides 290 kcal and 9 grams of protein  NUTRITION DIAGNOSIS:   Severe Malnutrition related to chronic illness as evidenced by energy intake < 75% for > or equal to 3 months, severe fat depletion, severe muscle depletion, ongoing  GOAL:   Patient will meet greater than or equal to 90% of their needs, currently unmet  MONITOR:   PO intake, Supplement acceptance, Labs, Skin, Weight trends, I & O's  ASSESSMENT:   62 yo female, admitted with acute exacerbation of COPD. PMH significant for chronic hypoxic and hypercarbic respiratory failure, chronic diastolic CHF, drug abuse, tobacco use, HTN, depression with anxiety. Intubated 12/7.   12/10 extubated 12/13 transferred out of 2H-ICU to 2W-Progressive Care  Pt not responsive to RD visit today. Appears very weak. Asked RD to turn her personal fans towards her. Pt currently on a Dysphagia 2, thin liquids.  No % PO intake records available. Labs & medications reviewed. CBG's [phone].  Diet Order:   Diet Order            DIET DYS 2 Room service appropriate? Yes; Fluid consistency: Thin  Diet effective now             EDUCATION NEEDS:   No education needs have been identified at this time  Skin:  Skin Assessment: Reviewed RN Assessment  Last BM:  12/13  Height:   Ht Readings from Last 1 Encounters:  06/22/18 5\' 2"  (1.575 m)   Weight:   Wt Readings from Last 1 Encounters:  06/26/18 42.7 kg   Ideal Body Weight:  50 kg  BMI:  Body mass index is 17.22 kg/m.  Estimated Nutritional Needs:   Kcal:  1400-1600   Protein:  70-85 gm  Fluid:  >/= 1.5 L  Maureen ChattersKatie Caily Rakers, RD, LDN Pager #: [phone] After-Hours Pager #: [phone]

## 2018-07-02 NOTE — Progress Notes (Signed)
SLP Cancellation Note  Patient Details Name: Vanessa FordVivian A Wallace MRN: [id number] DOB: 1956-07-14   Cancelled treatment:       Reason Eval/Treat Not Completed: Fatigue/lethargy limiting ability to participate   Maxcine Hamaiewonsky, Yakub Lodes 07/02/2018, 4:17 PM   Maxcine HamLaura Paiewonsky, M.A. CCC-SLP Acute Herbalistehabilitation Services Pager [phone] Office [phone]

## 2018-07-02 NOTE — Progress Notes (Signed)
Patient is wet but refused to be changed by staff. Patient educated that she is at risk of skin breakdown. Patient said she wanted to sleep. Incoming nurse made aware.

## 2018-07-02 NOTE — Progress Notes (Signed)
PROGRESS NOTE    Vanessa FordVivian A Wallace  JYN:[id number] DOB: 1956-06-01 DOA: 06/21/2018 PCP: Vanessa Bellowamos, Arlene G, MD  Brief Narrative: 62 year old female with end-stage COPD/chronic hypoxemic and hypercarbic respiratory failure on home O2 was admitted to the ICU for persistent respiratory acidosis and worsening mental status. -She was treated with BiPAP initially subsequently intubated due to worsening respiratory status. -Subsequently extubated,  -Underwent palliative care meeting and now is a full DNR per patient request -On further discussions with palliative MD decisions made for no BiPAP for worsening respiratory distress or hypercarbia and consideration of comfort care when she declines next -[facility] relative following, respiratory status remains very tenuous, not agreeable for home hospice yet  Assessment & Plan:   End stage COPD/Acute on chronic respiratory failure with hypoxia and hypercapnia (HCC) -Treated with BiPAP followed by mechanical ventilation -Has end-stage COPD with chronic hypoxemic and hypercarbic respiratory failure -[facility], respiratory status remains very tenuous -She has completed antibiotic course, remains on IV steroids, is marginally better, will transition to prednisone taper  -Palliative medicine following we have had multiple discussions regarding chronic end-of-life lung disease problems  -She is most appropriate for hospice, not agreeable to this yet  -She is DNR now  -Started on low-dose morphine and benzos for comfort in ICU -PT following, remains dyspneic and in distress even at rest  Metabolic encephalopathy -Due to hypercarbia -Now improved -Continue low-dose morphine and Klonopin for comfort  Chronic diastolic CHF -Echo with EF of 86%55% -Continue low-dose oral Lasix  Severe protein calorie malnutrition -Encourage supplements as tolerated  Anxiety -Continue Klonopin and PRN low-dose  morphine  Goals of care -DNR, no intubation, palliative following, taken off BiPAP days ago -Morphine and anxiolytics as needed -Pulmonary recommended no BiPAP when deteriorates next except for comfort, however patient is not agreeable for hospice, wants BiPAP if needed -No pulmonary reserve and tenuous respiratory status -We will discuss with patient and her son on the phone tomorrow regarding goals of care.  DVT prophylaxis: Lovenox Code Status: DNR Family Communication: Boyfriend Chester at bedside Disposition Plan: Declines SNF, to be determined would be most appropriate for hospice, wants to go home but not agreeable for home hospice yet  Consultants:   PCCM   Procedures:   Antimicrobials:    Subjective: Still with significant shortness of breath.  No nausea no vomiting no fever no chills.  Objective: Vitals:   07/01/18 2250 07/02/18 0743 07/02/18 0852 07/02/18 1723  BP: 130/86 131/76  136/89  Pulse: 96 97  97  Resp: (!) 22     Temp:  98 F (36.7 C)  98.3 F (36.8 C)  TempSrc:  Oral    SpO2: 95% 98% 93% 98%  Weight:      Height:       No intake or output data in the 24 hours ending 07/02/18 1926 Filed Weights   06/22/18 0500 06/25/18 0500 06/26/18 1130  Weight: 42.6 kg 42.8 kg 42.7 kg    Examination: Gen: Extremely cachectic chronically ill-appearing female in mild respiratory distress at rest in bed, alert awake oriented x2 HEENT: PERRLA, Neck supple, no JVD Lungs: Very poor air movement, no expiratory wheezes today CVS: RRR,No Gallops,Rubs or new Murmurs Abd: soft, Non tender, non distended, BS present Extremities: No edema Skin: no new rashes  Data Reviewed:   CBC: Recent Labs  Lab 06/27/18 0341 06/28/18 0312  WBC 7.1 5.2  NEUTROABS 6.0  --   HGB 12.2  12.8  HCT 42.5 43.2  MCV 88.0 86.9  PLT 185 165   Basic Metabolic Panel: Recent Labs  Lab 06/26/18 0210 06/27/18 0341 06/28/18 0312 06/29/18 0539  NA 140 139 137  --   K 5.3* 4.8 4.8   --   CL 96* 92* 90*  --   CO2 37* 36* 37*  --   GLUCOSE 123* 131* 107*  --   BUN 30* 26* 18  --   CREATININE 0.60 0.40* 0.50 0.44  CALCIUM 9.2 9.5 9.7  --   MG 2.2 2.1  --   --   PHOS 3.2 3.1  --   --    GFR: Estimated Creatinine Clearance: 50.4 mL/min (by C-Wallace formula based on SCr of 0.44 mg/dL). Liver Function Tests: No results for input(s): AST, ALT, ALKPHOS, BILITOT, PROT, ALBUMIN in the last 168 hours. No results for input(s): LIPASE, AMYLASE in the last 168 hours. No results for input(s): AMMONIA in the last 168 hours. Coagulation Profile: No results for input(s): INR, PROTIME in the last 168 hours. Cardiac Enzymes: No results for input(s): CKTOTAL, CKMB, CKMBINDEX, TROPONINI in the last 168 hours. BNP (last 3 results) No results for input(s): PROBNP in the last 8760 hours. HbA1C: No results for input(s): HGBA1C in the last 72 hours. CBG: Recent Labs  Lab 07/01/18 1949 07/01/18 2355 07/02/18 0739 07/02/18 1213 07/02/18 1723  GLUCAP 104* 121* 149* 197* 149*   Lipid Profile: No results for input(s): CHOL, HDL, LDLCALC, TRIG, CHOLHDL, LDLDIRECT in the last 72 hours. Thyroid Function Tests: No results for input(s): TSH, T4TOTAL, FREET4, T3FREE, THYROIDAB in the last 72 hours. Anemia Panel: No results for input(s): VITAMINB12, FOLATE, FERRITIN, TIBC, IRON, RETICCTPCT in the last 72 hours. Urine analysis: No results found for: COLORURINE, APPEARANCEUR, LABSPEC, PHURINE, GLUCOSEU, HGBUR, BILIRUBINUR, KETONESUR, PROTEINUR, UROBILINOGEN, NITRITE, LEUKOCYTESUR Sepsis Labs: @LABRCNTIP (procalcitonin:4,lacticidven:4)  ) No results found for this or any previous visit (from the past 240 hour(s)).       Radiology Studies: No results found.      Scheduled Meds: . amLODipine  10 mg Oral Daily  . aspirin  81 mg Oral Daily  . chlorhexidine  15 mL Mouth Rinse BID  . clonazepam  0.5 mg Oral BID  . DULoxetine  60 mg Oral Daily  . enoxaparin (LOVENOX) injection  30 mg  Subcutaneous Q24H  . furosemide  20 mg Oral Daily  . hydrALAZINE  25 mg Oral Q8H  . insulin aspart  0-9 Units Subcutaneous TID WC  . ipratropium-albuterol  3 mL Nebulization BID  . mouth rinse  15 mL Mouth Rinse q12n4p  . pantoprazole  40 mg Oral QHS  . predniSONE  50 mg Oral Q breakfast  . sodium chloride flush  3 mL Intravenous Q12H  . sodium chloride flush  3 mL Intravenous Q12H  . sodium chloride flush  3 mL Intravenous Q12H   Continuous Infusions: . sodium chloride    . sodium chloride       LOS: 10 days    Time spent:    Lynden Oxford  [facility] Page via [email or web address], password [email or web address] After 7PM please contact night-coverage  07/02/2018, 7:26 PM

## 2018-07-02 NOTE — Progress Notes (Signed)
Physical Therapy Treatment Patient Details Name: Vanessa Wallace MRN: [id number] DOB: 1956/01/14 Today's Date: 07/02/2018    History of Present Illness 62 y.o. female admitted 06/21/18 with SOB. Intubated on 12/8 and extubated on 12/10. PMH: COPD with chronic hypoxic and hypercarbic respiratory failure, chronic diastolic CHF, drug abuse, depression with anxiety, and hypertension     PT Comments    Pt was seen for mobility and strengthening, and so was able to move legs well on the bed.  However, declined about the gait and transfer skills, resulting in a bed level visit if needed.  Follow acutely to progress her balance and endurance, to decrease fall risk and to increase control of standing and walking.  Follow Up Recommendations  Supervision for mobility/OOB;SNF     Equipment Recommendations  None recommended by PT;Other (comment)    Recommendations for Other Services       Precautions / Restrictions Precautions Precautions: Fall;Other (comment) Precaution Comments: monitor O2 sats.  Restrictions Weight Bearing Restrictions: No    Mobility  Bed Mobility Overal bed mobility: Needs Assistance             General bed mobility comments: declined to get OOB  Transfers                 General transfer comment: did not want to stand  Ambulation/Gait                 Stairs             Wheelchair Mobility    Modified Rankin (Stroke Patients Only)       Balance                                            Cognition Arousal/Alertness: Lethargic Behavior During Therapy: Flat affect Overall Cognitive Status: No family/caregiver present to determine baseline cognitive functioning                                 General Comments: No family present.  Pt asked about her home situation (different than what the chart says).  She was able to tell me her name and her birthday accurately, and became more alert as we moved  to the EOB.       Exercises General Exercises - Lower Extremity Ankle Circles/Pumps: AROM;Both;5 reps Quad Sets: AROM;5 reps Gluteal Sets: AROM;10 reps Heel Slides: AROM;Both;10 reps Hip ABduction/ADduction: AROM;Both;10 reps Straight Leg Raises: AAROM;Both;10 reps    General Comments        Pertinent Vitals/Pain Pain Assessment: No/denies pain    Home Living                      Prior Function            PT Goals (current goals can now be found in the care plan section) Acute Rehab PT Goals Patient Stated Goal: to watch TV Progress towards PT goals: Progressing toward goals    Frequency    Min 3X/week      PT Plan Current plan remains appropriate    Co-evaluation              AM-PAC PT "6 Clicks" Mobility   Outcome Measure  Help needed turning from your back to your side while in a flat bed without  using bedrails?: A Little Help needed moving from lying on your back to sitting on the side of a flat bed without using bedrails?: A Little Help needed moving to and from a bed to a chair (including a wheelchair)?: A Little Help needed standing up from a chair using your arms (e.g., wheelchair or bedside chair)?: A Little Help needed to walk in hospital room?: Total Help needed climbing 3-5 steps with a railing? : Total 6 Click Score: 14    End of Session Equipment Utilized During Treatment: Oxygen Activity Tolerance: Patient limited by fatigue Patient left: in bed;with call bell/phone within reach;with bed alarm set Nurse Communication: Mobility status PT Visit Diagnosis: Muscle weakness (generalized) (M62.81);Unsteadiness on feet (R26.81)     Time: [id number] PT Time Calculation (min) (ACUTE ONLY): 13 min  Charges:  $Therapeutic Exercise: 8-22 mins                  Ivar DrapeRuth E Raini Tiley 07/02/2018, 4:17 PM   Samul Dadauth Karlin Binion, PT MS Acute Rehab Dept. Number: [facility] [phone] and [facility] [phone]

## 2018-07-03 LAB — COMPREHENSIVE METABOLIC PANEL
ALT: 31 U/L (ref 0–44)
AST: 22 U/L (ref 15–41)
Albumin: 2.9 g/dL — ABNORMAL LOW (ref 3.5–5.0)
Alkaline Phosphatase: 47 U/L (ref 38–126)
Anion gap: 12 (ref 5–15)
BUN: 25 mg/dL — ABNORMAL HIGH (ref 6–20)
CO2: 34 mmol/L — ABNORMAL HIGH (ref 22–32)
Calcium: 9.1 mg/dL (ref 8.9–10.3)
Chloride: 96 mmol/L — ABNORMAL LOW (ref 98–111)
Creatinine, Ser: 0.58 mg/dL (ref 0.44–1.00)
GFR calc Af Amer: >60 mL/min (ref >60)
GFR calc non Af Amer: >60 mL/min (ref >60)
Glucose, Bld: 128 mg/dL — ABNORMAL HIGH (ref 70–99)
Potassium: 3.7 mmol/L (ref 3.5–5.1)
Sodium: 142 mmol/L (ref 135–145)
Total Bilirubin: 0.4 mg/dL (ref 0.3–1.2)
Total Protein: 6.3 g/dL — ABNORMAL LOW (ref 6.5–8.1)

## 2018-07-03 LAB — CBC WITH DIFFERENTIAL/PLATELET
Abs Immature Granulocytes: 0.02 10*3/uL (ref 0.00–0.07)
Basophils Absolute: 0 10*3/uL (ref 0.0–0.1)
Basophils Relative: 0 %
Eosinophils Absolute: 0 10*3/uL (ref 0.0–0.5)
Eosinophils Relative: 0 %
HCT: 39 % (ref 36.0–46.0)
Hemoglobin: 11.6 g/dL — ABNORMAL LOW (ref 12.0–15.0)
Immature Granulocytes: 0 %
Lymphocytes Relative: 22 %
Lymphs Abs: 2.2 10*3/uL (ref 0.7–4.0)
MCH: 26.5 pg (ref 26.0–34.0)
MCHC: 29.7 g/dL — ABNORMAL LOW (ref 30.0–36.0)
MCV: 89.2 fL (ref 80.0–100.0)
MONO ABS: 1 10*3/uL (ref 0.1–1.0)
MONOS PCT: 10 %
Neutro Abs: 6.8 10*3/uL (ref 1.7–7.7)
Neutrophils Relative %: 68 %
PLATELETS: 187 10*3/uL (ref 150–400)
RBC: 4.37 MIL/uL (ref 3.87–5.11)
RDW: 16.7 % — ABNORMAL HIGH (ref 11.5–15.5)
WBC: 10 10*3/uL (ref 4.0–10.5)
nRBC: 0 % (ref 0.0–0.2)

## 2018-07-03 LAB — MAGNESIUM: Magnesium: 1.9 mg/dL (ref 1.7–2.4)

## 2018-07-03 LAB — GLUCOSE, CAPILLARY
Glucose-Capillary: 114 mg/dL — ABNORMAL HIGH (ref 70–99)
Glucose-Capillary: 254 mg/dL — ABNORMAL HIGH (ref 70–99)
Glucose-Capillary: 177 mg/dL — ABNORMAL HIGH (ref 70–99)
Glucose-Capillary: 127 mg/dL — ABNORMAL HIGH (ref 70–99)

## 2018-07-03 NOTE — Care Management Note (Signed)
Case Management Note  Patient Details  Name: Eliot FordVivian A Shave MRN: [id number] Date of Birth: [birth year]  Subjective/Objective:   MD has spoken with family this evening and they have decided they want [facility] , will need HHRN, HHPT, and Social worker set up. For DC tomorrow.                  Action/Plan: Will need HH services set up for tomorrow.  Expected Discharge Date:                  Expected Discharge Plan:  Home w Home Health Services  In-House Referral:     Discharge planning Services  CM Consult  Post Acute Care Choice:  Home Health Choice offered to:     DME Arranged:    DME Agency:     HH Arranged:    HH Agency:     Status of Service:  In process, will continue to follow  If discussed at Long Length of Stay Meetings, dates discussed:    Additional Comments:  Leone Havenaylor, Devaughn Savant Clinton, RN 07/03/2018, 5:41 PM

## 2018-07-03 NOTE — Progress Notes (Signed)
OT Cancellation Note  Patient Details Name: Vanessa Wallace MRN: [id number] DOB: 1955/12/12   Cancelled Treatment:    Reason Eval/Treat Not Completed: Fatigue/lethargy limiting ability to participate. Per conversation with nurse pt very lethargic today, unable to feed self. Will hold OT for today and continue to follow.  Raynald KempKathryn Levone Otten, OT Acute Rehabilitation Services Pager: [phone] Office: [phone]  07/03/2018, 11:42 AM

## 2018-07-03 NOTE — Progress Notes (Signed)
PROGRESS NOTE    Vanessa Wallace  UJW:[id number] DOB: 03-19-1956 DOA: 06/21/2018 PCP: Regino Bellowamos, Arlene G, MD  Brief Narrative: 62 year old female with end-stage COPD/chronic hypoxemic and hypercarbic respiratory failure on home O2 was admitted to the ICU for persistent respiratory acidosis and worsening mental status. -She was treated with BiPAP initially subsequently intubated due to worsening respiratory status. -Subsequently extubated,  -Underwent palliative care meeting and now is a full DNR per patient request -On further discussions with palliative MD decisions made for no BiPAP for worsening respiratory distress or hypercarbia and consideration of comfort care when she declines next -[facility] relative following, respiratory status remains very tenuous, not agreeable for home hospice yet  Assessment & Plan:   End stage COPD/Acute on chronic respiratory failure with hypoxia and hypercapnia (HCC) -Treated with BiPAP followed by mechanical ventilation -Has end-stage COPD with chronic hypoxemic and hypercarbic respiratory failure -[facility] respiratory status remains very tenuous -She has completed antibiotic course, remains on IV steroids, is marginally better, will transition to prednisone taper  -Palliative medicine following we have had multiple discussions regarding chronic end-of-life lung disease problems  -She is most appropriate for hospice, not agreeable to this yet  -She is DNR now  -Started on low-dose morphine and benzos for comfort in ICU -PT following, remains dyspneic and in distress even at rest  Metabolic encephalopathy -Due to hypercarbia -Now improved -Continue low-dose morphine and Klonopin for comfort  Chronic diastolic CHF -Echo with EF of 56%55% -Continue low-dose oral Lasix  Severe protein calorie malnutrition -Encourage supplements as tolerated  Anxiety -Continue Klonopin and PRN low-dose  morphine  Goals of care -DNR, no intubation, palliative following, taken off BiPAP days ago -Morphine and anxiolytics as needed -Pulmonary recommended no BiPAP when deteriorates next except for comfort, however patient is not agreeable for hospice, wants BiPAP if needed -No pulmonary reserve and tenuous respiratory status -Discussed with patient's son as well as sister at bedside.  Patient was also awake and alert and oriented to discuss goals of care. She wants to go home, she is okay to come back to the hospital if she has further worsening of her condition and would like to be treated for treatable condition as well as would like to be placed on BiPAP if needed in future. I relayed my concerns regarding patient's poor prognosis which the family understands and recommended to have continuous palliative care discussion outpatient.  DVT prophylaxis: Lovenox Code Status: DNR Family Communication: Boyfriend Chester at bedside Disposition Plan: Declines SNF, to be determined would be most appropriate for hospice, wants to go home but not agreeable for home hospice yet  Consultants:   PCCM   Procedures:   Antimicrobials:    Subjective: Breathing is somewhat better.  No nausea no vomiting.  No fever no chills.  Objective: Vitals:   07/03/18 0816 07/03/18 1300 07/03/18 1727 07/03/18 2003  BP:  124/78 134/76   Pulse:  (!) 115 (!) 112   Resp:      Temp:   98.1 F (36.7 C)   TempSrc:   Oral   SpO2: 91%  90% 91%  Weight:      Height:        Intake/Output Summary (Last 24 hours) at 07/03/2018 2144 Last data filed at 07/03/2018 2100 Gross per 24 hour  Intake 205 ml  Output 701 ml  Net -496 ml   Filed Weights   06/22/18 0500 06/25/18 0500 06/26/18 1130  Weight: 42.6 kg 42.8 kg 42.7 kg    Examination: Gen: Extremely cachectic chronically ill-appearing female in mild respiratory distress at rest in bed, alert awake oriented x2 HEENT: PERRLA, Neck supple, no JVD Lungs: Very  poor air movement, no expiratory wheezes today CVS: RRR,No Gallops,Rubs or new Murmurs Abd: soft, Non tender, non distended, BS present Extremities: No edema Skin: no new rashes  Data Reviewed:   CBC: Recent Labs  Lab 06/27/18 0341 06/28/18 0312 07/03/18 0256  WBC 7.1 5.2 10.0  NEUTROABS 6.0  --  6.8  HGB 12.2 12.8 11.6*  HCT 42.5 43.2 39.0  MCV 88.0 86.9 89.2  PLT 185 165 187   Basic Metabolic Panel: Recent Labs  Lab 06/27/18 0341 06/28/18 0312 06/29/18 0539 07/03/18 0256  NA 139 137  --  142  K 4.8 4.8  --  3.7  CL 92* 90*  --  96*  CO2 36* 37*  --  34*  GLUCOSE 131* 107*  --  128*  BUN 26* 18  --  25*  CREATININE 0.40* 0.50 0.44 0.58  CALCIUM 9.5 9.7  --  9.1  MG 2.1  --   --  1.9  PHOS 3.1  --   --   --    GFR: Estimated Creatinine Clearance: 50.4 mL/min (by C-G formula based on SCr of 0.58 mg/dL). Liver Function Tests: Recent Labs  Lab 07/03/18 0256  AST 22  ALT 31  ALKPHOS 47  BILITOT 0.4  PROT 6.3*  ALBUMIN 2.9*   No results for input(s): LIPASE, AMYLASE in the last 168 hours. No results for input(s): AMMONIA in the last 168 hours. Coagulation Profile: No results for input(s): INR, PROTIME in the last 168 hours. Cardiac Enzymes: No results for input(s): CKTOTAL, CKMB, CKMBINDEX, TROPONINI in the last 168 hours. BNP (last 3 results) No results for input(s): PROBNP in the last 8760 hours. HbA1C: No results for input(s): HGBA1C in the last 72 hours. CBG: Recent Labs  Lab 07/02/18 2103 07/03/18 0737 07/03/18 1154 07/03/18 1726 07/03/18 2116  GLUCAP 162* 114* 254* 177* 127*   Lipid Profile: No results for input(s): CHOL, HDL, LDLCALC, TRIG, CHOLHDL, LDLDIRECT in the last 72 hours. Thyroid Function Tests: No results for input(s): TSH, T4TOTAL, FREET4, T3FREE, THYROIDAB in the last 72 hours. Anemia Panel: No results for input(s): VITAMINB12, FOLATE, FERRITIN, TIBC, IRON, RETICCTPCT in the last 72 hours. Urine analysis: No results found  for: COLORURINE, APPEARANCEUR, LABSPEC, PHURINE, GLUCOSEU, HGBUR, BILIRUBINUR, KETONESUR, PROTEINUR, UROBILINOGEN, NITRITE, LEUKOCYTESUR Sepsis Labs: @LABRCNTIP (procalcitonin:4,lacticidven:4)  ) No results found for this or any previous visit (from the past 240 hour(s)).       Radiology Studies: No results found.      Scheduled Meds: . amLODipine  10 mg Oral Daily  . aspirin  81 mg Oral Daily  . chlorhexidine  15 mL Mouth Rinse BID  . clonazepam  0.5 mg Oral BID  . DULoxetine  60 mg Oral Daily  . enoxaparin (LOVENOX) injection  30 mg Subcutaneous Q24H  . furosemide  20 mg Oral Daily  . hydrALAZINE  25 mg Oral Q8H  . insulin aspart  0-9 Units Subcutaneous TID WC  . ipratropium-albuterol  3 mL Nebulization BID  . mouth rinse  15 mL Mouth Rinse q12n4p  . pantoprazole  40 mg Oral QHS  . predniSONE  50 mg Oral Q breakfast  . sodium chloride flush  3 mL Intravenous Q12H  . sodium chloride flush  3 mL Intravenous Q12H  . sodium  chloride flush  3 mL Intravenous Q12H   Continuous Infusions: . sodium chloride    . sodium chloride       LOS: 11 days    Time spent: 35min    Lynden OxfordPranav Steffanie Mingle  [facility] Page via [email or web address], password [email or web address] After 7PM please contact night-coverage  07/03/2018, 9:44 PM

## 2018-07-04 LAB — GLUCOSE, CAPILLARY
Glucose-Capillary: 389 mg/dL — ABNORMAL HIGH (ref 70–99)
Glucose-Capillary: 326 mg/dL — ABNORMAL HIGH (ref 70–99)
Glucose-Capillary: 263 mg/dL — ABNORMAL HIGH (ref 70–99)
Glucose-Capillary: 93 mg/dL (ref 70–99)

## 2018-07-04 MED ORDER — PREDNISONE 10 MG PO TABS: ORAL_TABLET | ORAL | 0 refills | Status: DC

## 2018-07-04 MED ORDER — ASPIRIN 81 MG PO CHEW: 81.0000 mg | CHEWABLE_TABLET | Freq: Every day | ORAL | 0 refills | Status: DC

## 2018-07-04 MED ORDER — AMLODIPINE BESYLATE 10 MG PO TABS: 10.0000 mg | ORAL_TABLET | Freq: Every day | ORAL | 0 refills | Status: DC

## 2018-07-04 MED ORDER — CLONAZEPAM 0.5 MG PO TBDP: 0.5000 mg | ORAL_TABLET | Freq: Two times a day (BID) | ORAL | 0 refills | Status: DC | PRN

## 2018-07-04 MED FILL — ASPIRIN LOW DOSE 81 MG CHEW: 81 | 30 days supply | Qty: 30 | Fill #0

## 2018-07-04 MED FILL — clonazePAM 0.5 MG TABS: 0.5 | 5 days supply | Qty: 10 | Fill #0

## 2018-07-04 MED FILL — AMLODIPINE BESYLATE 10 MG T: 10 | 30 days supply | Qty: 30 | Fill #0

## 2018-07-04 MED FILL — predniSONE 10 MG TABS: 10 | 12 days supply | Qty: 30 | Fill #0

## 2018-07-04 NOTE — Discharge Instructions (Signed)
Dysphagia Eating Plan, Minced and Moist Foods  This eating plan is for people with moderate swallowing problems who are transitioning from pureed to solid foods. Moist and minced foods are soft and cut into very small chunks so that they can be swallowed safely. On this eating plan, you may be instructed to drink liquids that are thickened.  Work with your health care provider and your diet and nutrition specialist (dietitian) to make sure that you are following the diet safely and getting all the nutrients you need.  What are tips for following this plan?  General guidelines for foods    · You may eat foods that are soft and moist.  · Always test food texture before taking a bite. Poke food with a fork or spoon to make sure it is tender.  · Take small bites. Each bite should be smaller than your little finger nail (about 4 mm by 4 mm).  · If you were on a pureed food eating plan, you may still eat any of the foods included in that diet.  · Avoid foods that are dry, hard, sticky, chewy, coarse, or crunchy.  · Avoid foods that separate into thin liquids and solids, such as cereal with milk or chunky soups.  · Avoid liquids that have seeds or chunks.  · If instructed by your health care provider, thicken liquids. Follow your health care provider's instructions about what products to use, how to do this, and to what thickness.  ? You may use a commercial thickener, rice cereal, or potato flakes.  ? Thickened liquids are usually a “pudding-like” consistency, or they may be as thick as honey or thick enough to eat with a spoon.  Cooking  · You may need to use a blender, whisk, or masher to soften some of your foods.  · To moisten foods, you may add liquids while you are blending, mashing, or grinding your foods to the right consistency. These liquids include gravies, sauces, vegetable or fruit juice, milk, half and half, or water.  · Reheat foods slowly to prevent a tough crust from forming.  Meal planning  · Eat a  variety of foods in order to get all the nutrients you need.  · Follow your meal plan as told by your health care provider or dietitian.  What foods are allowed?  Grains  Soaked soft breads without nuts or seeds. Pancakes, sweet rolls, pastries, and French toast that have been moistened with syrup or sauce. Well-cooked pasta, noodles, rice, and bread dressing in very small pieces and thick sauce. Soft dumplings or spaetzle in very small pieces and butter or gravy. Soft-cooked cereals.  Vegetables  Very soft, well-cooked vegetables in very small pieces. Soft-cooked, mashed potatoes. Thickened vegetable juice.  Fruits  Canned or cooked fruits that are soft or moist and do not have skin or seeds. Fresh, soft bananas. Thickened fruit juices.  Meat and other protein foods  Tender, moist, and finely minced or ground meats or poultry. Moist meatballs or meatloaf. Fish without bones. Scrambled, poached, or soft-cooked eggs. Tofu. Tempeh and meat alternatives in very small pieces. Well-cooked, moistened and mashed beans, baked beans, peas, and other legumes.  Dairy  Thickened milk. Cream cheese. Yogurt. Cottage cheese. Sour cream.  Fats and oils  Butter. Margarine. Cream for cereal, depending on liquid consistency allowed. Gravy. Cream sauces. Mayonnaise.  Sweets and desserts  Pudding. Custard. Ice cream and sherbet. Whipped toppings. Soft, moist cakes. Icing. Jelly. Jams and preserves without seeds.    Seasoning and other foods  Sauces and salsas that have soft chunks that are smaller than 4mm. Salad dressings. Casseroles with small pieces of tender meat. All seasonings and sweeteners.  Beverages  Anything prepared at the thickness recommended by your dietitian.  What foods are not allowed?  Grains  Breads that are hard or have nuts or seeds. Dry biscuits, pancakes, waffles, and bread dressing. Coarse cereals. Cereals that have nuts, seeds, dried fruits, or coconut. Sticky rice. Large pieces of pasta.  Vegetables  All raw  vegetables. Tough, fibrous, chewy, or stringy cooked vegetables, such as celery, peas, broccoli, cabbage, Brussels sprouts, and asparagus. Potato skins. Potato and other vegetable chips. Fried or French-fried potatoes. Cooked corn and peas.  Fruits  Hard, crunchy, stringy, high-pulp, and juicy raw fruits such as apples, pineapple, papaya, and watermelon. Fruits with skins and seeds, such as grapes. Dried fruit and fruit leather.  Meats and other protein foods  Large pieces of meat. Dry, tough meats, such as bacon, sausage, and hot dogs. Chicken, turkey, or fish with skin and bones. Crunchy peanut butter. Nuts. Seeds.  Dairy  Yogurt with nuts, seeds, or large chunks. Large chunks of cheese. Frozen desserts and milk consistency not allowed by your dietitian.  Sweets and desserts  Coarse, hard, chewy, or sticky desserts. Any dessert with nuts, seeds, coconut, pineapple, or dried fruit. Bread pudding.  Seasoning and other foods  Soups and casseroles with large chunks. Sandwiches. Pizza.  Summary  · Moist and minced foods can be helpful for people with moderate swallowing problems.  · On the dysphagia eating plan, you may eat foods that are soft, moist, and cut into pieces smaller than 4mm by 4mm.  · You may be instructed to thicken liquids. Follow your health care provider's instructions about how to do this and to what consistency.  This information is not intended to replace advice given to you by your health care provider. Make sure you discuss any questions you have with your health care provider.  Document Released: 07/02/2005 Document Revised: 10/12/2016 Document Reviewed: 10/12/2016  Elsevier Interactive Patient Education © 2019 [employer]

## 2018-07-04 NOTE — Progress Notes (Signed)
Occupational Therapy Treatment Patient Details Name: Vanessa FordVivian A Wallace MRN: [id number] DOB: [birth year] Today's Date: 07/04/2018    History of present illness 62 y.o. female admitted 06/21/18 with SOB. Intubated on 12/8 and extubated on 12/10. PMH: COPD with chronic hypoxic and hypercarbic respiratory failure, chronic diastolic CHF, drug abuse, depression with anxiety, and hypertension    OT comments  Pt making fair progress towards acute OT goals. Caregiver present and included in education on toilet transfers, bed mobility, positioning to prevent skin breakdown. Caregiver appears a bit overwhelmed when discussing what pt will need at home. He reports there is currently little assist other than himself and he works 1-9. [facility] ([facility], aide, nursing) at d/c. With encouragement, pt was able to stand from EOB ultimately with min +2 assist(mod +1) with pt pulling up on rw. Pt fatigued but more alert at end of session.    Follow Up Recommendations  SNF;Supervision/Assistance - 24 hour; noted that pt currently declining SNF, [facility], caregiver works 1-9pm every day   Equipment Recommendations  3 in 1 bedside commode    Recommendations for Other Services      Precautions / Restrictions Precautions Precautions: Fall;Other (comment) Precaution Comments: monitor O2 sats.  Restrictions Weight Bearing Restrictions: No       Mobility Bed Mobility Overal bed mobility: Needs Assistance Bed Mobility: Supine to Sit;Sit to Supine     Supine to sit: HOB elevated;Mod assist Sit to supine: Min assist;HOB elevated   General bed mobility comments: Able to advance BLE across bed, assist to powerup and pivot hips to EOB position. Min A for pivoting hips and BLE back into supine position. +2 assist to scoot up in bed at end of session. Caregiver included in education  Transfers Overall transfer level: Needs assistance Equipment used:  Rolling walker (2 wheeled) Transfers: Sit to/from Stand Sit to Stand: Min assist;+2 safety/equipment         General transfer comment: Needing encouragment to attempt to stand. Ultimately min A+2 / mod A +1 to stand. Pt pulling up on rw assist to stabilize rw and to steady pt during transition. Some powering up assist as well.    Balance Overall balance assessment: Needs assistance Sitting-balance support: Feet supported;Bilateral upper extremity supported Sitting balance-Leahy Scale: Fair Sitting balance - Comments: close supervision. Fatigues easily.   Standing balance support: Bilateral upper extremity supported;During functional activity Standing balance-Leahy Scale: Poor Standing balance comment: relies on UE support                           ADL either performed or assessed with clinical judgement   ADL Overall ADL's : Needs assistance/impaired                                       General ADL Comments: Pt completed bed mobility, sat EOB for about 5 minutes, stood briefly, then returned to supine. Educated pt and caregiver/roommate on safe transfers, quarteryl turns to prevent skin breakdown. Issued a gait belt and instructed caregiver in use.      Vision       Perception     Praxis      Cognition Arousal/Alertness: Awake/alert;Lethargic Behavior During Therapy: Flat affect Overall Cognitive Status: Within Functional Limits for tasks assessed  General Comments: Minimal verbalizations. More alert once EOB and at end of session. Very fatigued.        Exercises     Shoulder Instructions       General Comments Caregiver noted to be HOH.    Pertinent Vitals/ Pain       Pain Assessment: Faces Faces Pain Scale: Hurts little more Pain Location: unspecified, grimacing during transfer Pain Descriptors / Indicators: Grimacing Pain Intervention(s): Monitored during session;Limited activity  within patient's tolerance;Repositioned  Home Living                                          Prior Functioning/Environment              Frequency  Min 2X/week        Progress Toward Goals  OT Goals(current goals can now be found in the care plan section)  Progress towards OT goals: Progressing toward goals  Acute Rehab OT Goals Patient Stated Goal: to watch TV Time For Goal Achievement: 07/05/18 Potential to Achieve Goals: Good ADL Goals Pt Will Perform Grooming: with set-up;with supervision;sitting Pt Will Perform Upper Body Bathing: with set-up;sitting Pt Will Transfer to Toilet: bedside commode;stand pivot transfer;with supervision Additional ADL Goal #1: Pt will tolerate 10 minutes of sitting EOB or OOB in order to increase tolerance for participation in ADLs.  Plan Discharge plan remains appropriate    Co-evaluation    PT/OT/SLP Co-Evaluation/Treatment: Yes Reason for Co-Treatment: Complexity of the patient's impairments (multi-system involvement);To address functional/ADL transfers;For patient/therapist safety   OT goals addressed during session: ADL's and self-care;Proper use of Adaptive equipment and DME;Strengthening/ROM      AM-PAC OT "6 Clicks" Daily Activity     Outcome Measure   Help from another person eating meals?: A Lot Help from another person taking care of personal grooming?: A Lot Help from another person toileting, which includes using toliet, bedpan, or urinal?: A Lot Help from another person bathing (including washing, rinsing, drying)?: A Lot Help from another person to put on and taking off regular upper body clothing?: A Lot Help from another person to put on and taking off regular lower body clothing?: Total 6 Click Score: 11    End of Session Equipment Utilized During Treatment: Oxygen;Rolling walker  OT Visit Diagnosis: Other abnormalities of gait and mobility (R26.89)   Activity Tolerance Patient limited by  fatigue;Patient limited by lethargy   Patient Left in bed;with call bell/phone within reach;with bed alarm set;with family/visitor present   Nurse Communication Mobility status        Time: [id number] OT Time Calculation (min): 22 min  Charges: OT General Charges $OT Visit: 1 Visit OT Treatments $Self Care/Home Management : 8-22 mins  Raynald KempKathryn Preslea Rhodus, OT Acute Rehabilitation Services Pager: [phone] Office: [phone]    Pilar GrammesMathews, Edyth Glomb H 07/04/2018, 1:08 PM

## 2018-07-04 NOTE — Progress Notes (Signed)
  Speech Language Pathology Treatment: Dysphagia  Patient Details Name: Vanessa FordVivian A Dupriest MRN: [id number] DOB: [birth year] Today's Date: 07/04/2018 Time: [id number] SLP Time Calculation (min) (ACUTE ONLY): 9 min  Assessment / Plan / Recommendation Clinical Impression  SLP provided skilled observation as pt was being fed lunch by her visitor (roommate/friend). Pt was eating at a rapid intake, demanding that she be fed additional bites before she had masticated what was in her mouth. SLP intervened to slow the rate of feeding and allow not only time for oral preparation and clearance but also for pt to catch her breath. Pt had no overt signs of aspiration, but does remain at risk if she eats at such a rapid rate, particularly since she remains dyspneic. Education about diet recommendations, swallowing precautions, and rationale were provided to pt and friend. [facility] as she is declining [facility].    HPI HPI: 62 y.o. female admitted 06/21/18 with SOB. PMH: COPD with chronic hypoxic and hypercarbic respiratory failure, chronic diastolic CHF, drug abuse, depression with anxiety, and hypertension      SLP Plan  Continue with current plan of care       Recommendations  Diet recommendations: Dysphagia 2 (fine chop);Thin liquid Liquids provided via: Straw Medication Administration: Whole meds with liquid Supervision: Patient able to self feed;Full supervision/cueing for compensatory strategies Compensations: Small sips/bites;Slow rate(frequent rest breaks for respirations) Postural Changes and/or Swallow Maneuvers: Seated upright 90 degrees                Oral Care Recommendations: Oral care BID Follow up Recommendations: Home health SLP;24 hour supervision/assistance SLP Visit Diagnosis: Dysphagia, unspecified (R13.10) Plan: Continue with current plan of care       GO                Maxcine Hamaiewonsky, Tanasia Budzinski 07/04/2018, 1:47 PM  Maxcine HamLaura Paiewonsky, M.A.  CCC-SLP Acute Herbalistehabilitation Services Pager [phone] Office [phone]

## 2018-07-04 NOTE — Progress Notes (Signed)
Physical Therapy Treatment Patient Details Name: Eliot FordVivian A Pierrelouis MRN: [id number] DOB: 1956/06/04 Today's Date: 07/04/2018    History of Present Illness 62 y.o. female admitted 06/21/18 with SOB. Intubated on 12/8 and extubated on 12/10. PMH: COPD with chronic hypoxic and hypercarbic respiratory failure, chronic diastolic CHF, drug abuse, depression with anxiety, and hypertension     PT Comments    Patient reports extreme fatigue at entry and wishing not to mobilize. Eventually agreeable to trial standing and transfers, session focused on education of friend/caretaker/roomate named Annia FriendlyChester. Patient on 5L desatting to 80s at rest, standing with min A.  Able to perform pivot transfer with hands on assistance, fatigues extremely quickly. Still agree with SNF but acknowledge chart that patient is returning home.    Follow Up Recommendations  Supervision for mobility/OOB;SNF (if no SNF- [facility])     Equipment Recommendations  None recommended by PT, BSC    Recommendations for Other Services       Precautions / Restrictions Precautions Precautions: Fall;Other (comment) Precaution Comments: monitor O2 sats.  Restrictions Weight Bearing Restrictions: No    Mobility  Bed Mobility Overal bed mobility: Needs Assistance Bed Mobility: Supine to Sit;Sit to Supine     Supine to sit: HOB elevated;Mod assist Sit to supine: Min assist;HOB elevated   General bed mobility comments: Able to advance BLE across bed, assist to powerup and pivot hips to EOB position. Min A for pivoting hips and BLE back into supine position. +2 assist to scoot up in bed at end of session. Caregiver included in education  Transfers Overall transfer level: Needs assistance Equipment used: Rolling walker (2 wheeled) Transfers: Sit to/from Stand Sit to Stand: Min assist;+2 safety/equipment         General transfer comment: Needing encouragment to attempt to stand. Ultimately min A+2 / mod A +1 to  stand. Pt pulling up on rw assist to stabilize rw and to steady pt during transition. Some powering up assist as well.  Ambulation/Gait             General Gait Details: unable.  desatting at rest and reports extreme fatigue    Stairs             Wheelchair Mobility    Modified Rankin (Stroke Patients Only)       Balance Overall balance assessment: Needs assistance Sitting-balance support: Feet supported;Bilateral upper extremity supported Sitting balance-Leahy Scale: Fair Sitting balance - Comments: close supervision. Fatigues easily.   Standing balance support: Bilateral upper extremity supported;During functional activity Standing balance-Leahy Scale: Poor Standing balance comment: relies on UE support                            Cognition Arousal/Alertness: Awake/alert;Lethargic Behavior During Therapy: Flat affect Overall Cognitive Status: Within Functional Limits for tasks assessed                                 General Comments: Minimal verbalizations. More alert once EOB and at end of session. Very fatigued.      Exercises      General Comments General comments (skin integrity, edema, etc.): educated caregiver on proper handling techinques, use of BSC, role of HHPT.      Pertinent Vitals/Pain Pain Assessment: Faces Faces Pain Scale: Hurts little more Pain Location: unspecified, grimacing during transfer Pain Descriptors / Indicators: Grimacing Pain Intervention(s): Limited activity within patient's  tolerance;Monitored during session    Home Living                      Prior Function            PT Goals (current goals can now be found in the care plan section) Acute Rehab PT Goals Patient Stated Goal: to watch TV PT Goal Formulation: With patient Time For Goal Achievement: 07/11/18 Potential to Achieve Goals: Good Progress towards PT goals: Progressing toward goals    Frequency    Min  3X/week      PT Plan Current plan remains appropriate    Co-evaluation PT/OT/SLP Co-Evaluation/Treatment: Yes Reason for Co-Treatment: Complexity of the patient's impairments (multi-system involvement);To address functional/ADL transfers;For patient/therapist safety PT goals addressed during session: Mobility/safety with mobility;Balance;Proper use of DME;Strengthening/ROM OT goals addressed during session: ADL's and self-care;Proper use of Adaptive equipment and DME;Strengthening/ROM      AM-PAC PT "6 Clicks" Mobility   Outcome Measure  Help needed turning from your back to your side while in a flat bed without using bedrails?: A Little Help needed moving from lying on your back to sitting on the side of a flat bed without using bedrails?: A Little Help needed moving to and from a bed to a chair (including a wheelchair)?: A Little Help needed standing up from a chair using your arms (e.g., wheelchair or bedside chair)?: A Little Help needed to walk in hospital room?: Total Help needed climbing 3-5 steps with a railing? : Total 6 Click Score: 14    End of Session Equipment Utilized During Treatment: Oxygen(4L) Activity Tolerance: Patient limited by fatigue Patient left: in bed;with call bell/phone within reach;with bed alarm set Nurse Communication: Mobility status PT Visit Diagnosis: Muscle weakness (generalized) (M62.81);Unsteadiness on feet (R26.81)     Time: [id number] PT Time Calculation (min) (ACUTE ONLY): 41 min  Charges:  $Therapeutic Activity: 8-22 mins $Self Care/Home Management: 8-22                     Etta GrandchildSean Buddie Marston, PT, DPT Acute Rehabilitation Services Pager: [phone] Office: [phone]     Etta GrandchildSean Billal Rollo 07/04/2018, 1:36 PM

## 2018-07-04 NOTE — Care Management Note (Addendum)
Case Management Note  Patient Details  Name: Vanessa Wallace MRN: [id number] Date of Birth: 01-27-1956  Subjective/Objective:    COPD               Action/Plan: CM talked to patient about HHC choice, pt chose [facility]; [name] with [facility] called for arrangements; [facility]; [name] with Hospice called; CM will continue to follow for progression of care. B [name] RN,MHA,BSN  12:30 pm - Received call concerning pt transportation home; pt stated that her family usually takes her home; permission given to talk to her sister; TCT Aura CampsJannie [phone]; [name] stated that she would find someone to pick up her, message left with her son also; she has home oxygen through [facility]; [name] called for portable oxygen for home. B [name] RN,MHA,BSN  3:53 pm- CM talked to patient at the bedside, she stated that her sister will be transporting home this pm. Portable oxygen tank at the bedside. Abelino DerrickB [name] RN,MHA,BSN  Expected Discharge Date:    possibly 07/04/2018              Expected Discharge Plan:  Home w Home Health Services  Discharge planning Services  CM Consult  Post Acute Care Choice:  Home Health Choice offered to:   Patient  Status of Service:  In process, will continue to follow  [name], [name], RN,MHA,BSN [phone] 07/04/2018, 10:27 AM

## 2018-07-04 NOTE — Discharge Summary (Signed)
[facility] Discharge Summary   Patient: Vanessa FordVivian A Effertz ZOX:[id number]   PCP: Regino Bellowamos, Arlene G, MD DOB: 25-May-1956   Date of admission: 06/21/2018   Date of discharge:  07/04/2018    Discharge Diagnoses:   Principal Problem:   COPD with acute exacerbation (HCC) Active Problems:   Acute on chronic respiratory failure with hypoxia and hypercapnia (HCC)   Anxiety   Transaminasemia   Acute respiratory failure with hypercapnia (HCC)   Polysubstance abuse (HCC)   Palliative care encounter   Admitted From: Home Disposition: Home with home health  Recommendations for Outpatient Follow-up:  1. Please follow-up with PCP in 1 week. 2. Please continue to engage with palliative care to discuss goals of care.  Follow-up Information    [facility] - Dme Follow up.   Why:  They will do your home health care at your home Contact information: [address] [place] [address] [phone]        Regino Bellowamos, Arlene G, MD. Schedule an appointment as soon as possible for a visit in 1 week(s).   Specialty:  Family Medicine Contact information: [address] [place] [address] [phone]          Diet recommendation: Dysphagia 2 diet, thin liquids  Activity: The patient is advised to gradually reintroduce usual activities.  Discharge Condition: fair  Code Status: DNR/DNI, BiPAP is okay  History of present illness: As per the H and P dictated on admission, "Vanessa FordVivian A Viveros is a 62 y.o. female with medical history significant for COPD with chronic hypoxic and hypercarbic respiratory failure, chronic diastolic CHF, drug abuse, depression with anxiety, and hypertension, now presenting to the emergency department for evaluation of shortness of breath.  She had seen a pulmonologist on 06/17/2018, had diffuse wheezing and dyspnea with anxiety suspected to be contributing.  She was started on azithromycin and prednisone during that visit, but per report of her  friend at the bedside, she developed worsening shortness of breath and cough 2 to 3 days ago, and was then in acute respiratory distress tonight.  She is unable to contribute much to the history at this time due to being on BiPAP and in acute distress, but she denies chest pain, rhinorrhea, or fevers.  Per the friend at bedside, she has not been complaining of anything other than being short of breath.  EMS was called, found the patient to be saturating 81% on her usual 3 L/min of supplemental oxygen.  She was treated with 125 mg of IV Solu-Medrol, 2 g magnesium, DuoNeb's, and CPAP prior to arrival in the ED.  ED Course: Upon arrival to the ED, patient is found to be with afebrile, saturating 81%, tachypneic, slightly tachycardic, and with stable blood pressure.  EKG features sinus rhythm with LVH.  Chest x-ray is notable for cardiomegaly with emphysema, but no acute findings.  Chemistry panel features mild elevation in transaminases.  CBC is unremarkable and troponin is normal.  Blood cultures were collected and the patient was given 20 mg IV Lasix and Levaquin in the ED.  She is reportedly improved since time of arrival, but remains in distress, requiring BiPAP, and will be admitted to the stepdown unit for ongoing evaluation and management of COPD with acute exacerbation."  Hospital Course:  Summary of her active problems in the hospital is as following. End stage COPD/Acute on chronic respiratory failure with hypoxia and hypercapnia (HCC) -Treated with BiPAP followed by mechanical ventilation -Has end-stage COPD with chronic hypoxemic  and hypercarbic respiratory failure -Extubated and transferred to [facility] service, respiratory status remains very tenuous -She has completed antibiotic course, remains on IV steroids, is marginally better, will transition to prednisone taper  -Palliative medicine following we have had multiple discussions regarding chronic end-of-life lung disease problems  -She is most  appropriate for hospice, not agreeable to this yet  -She is DNR now  -Started on low-dose morphine and benzos for comfort in ICU -Patient adamantly wants to go home right now.  Home with home health arranged prior to discharge.  Patient and family refused home hospice option.  But they are agreeable for outpatient palliative care follow-up.  Metabolic encephalopathy -Due to hypercarbia -Now improved -Continue Klonopin for anxiety and monitor closely.  Chronic diastolic CHF -Echo with EF of 40% -Continue low-dose oral Lasix  Severe protein calorie malnutrition -Encourage supplements as tolerated  Anxiety -Continue Klonopin  Goals of care I had a prolonged discussion with patient's family, son and sister at bedside on 07/03/2018. -DNR, no intubation, BiPAP is okay.  Would like to treat what is treatable and would like to come back to the hospital if her condition worsens.  -Pulmonary recommended no BiPAP when deteriorates next except for comfort, however patient is not agreeable for hospice, wants BiPAP if needed  -No pulmonary reserve and tenuous respiratory status -Discussed with patient's son as well as sister at bedside.  Patient was also awake and alert and oriented to discuss goals of care. She wants to go home, she is okay to come back to the hospital if she has further worsening of her condition and would like to be treated for treatable condition as well as would like to be placed on BiPAP if needed in future. I relayed my concerns regarding patient's poor prognosis which the family understands and recommended to have continuous palliative care discussion outpatient.  All other chronic medical condition were stable during the hospitalization.  Patient was seen by physical therapy, who recommended SNF, patient refused to go to SNF. Requested to go home instead. Discussed with family and they agreed with patient's decision, home health which was arranged by Child psychotherapist  and case Production designer, theatre/television/film. On the day of the discharge the patient's vitals are stable, and no other acute medical condition were reported by patient. the patient was felt safe to be discharge at home with home health.  Consultants: PCCM, palliative care Procedures: BiPAP  DISCHARGE MEDICATION: Allergies as of 07/04/2018   No Known Allergies     Medication List    STOP taking these medications   aspirin 81 MG tablet Replaced by:  aspirin 81 MG chewable tablet   clonazePAM 0.5 MG tablet Commonly known as:  KLONOPIN Replaced by:  clonazePAM 0.5 MG disintegrating tablet   lisinopril 20 MG tablet Commonly known as:  PRINIVIL,ZESTRIL   LORazepam 0.5 MG tablet Commonly known as:  ATIVAN     TAKE these medications   albuterol (2.5 MG/3ML) 0.083% nebulizer solution Commonly known as:  PROVENTIL Take 2.5 mg by nebulization every 6 (six) hours as needed for wheezing or shortness of breath.   albuterol 108 (90 Base) MCG/ACT inhaler Commonly known as:  PROVENTIL HFA;VENTOLIN HFA Inhale 1-2 puffs into the lungs every 6 (six) hours as needed for wheezing or shortness of breath.   amLODipine 10 MG tablet Commonly known as:  NORVASC Take 1 tablet (10 mg total) by mouth daily. Start taking on:  July 05, 2018 What changed:    medication strength  how much to  take   aspirin 81 MG chewable tablet Chew 1 tablet (81 mg total) by mouth daily. Start taking on:  July 05, 2018 Replaces:  aspirin 81 MG tablet   busPIRone 7.5 MG tablet Commonly known as:  BUSPAR Take 7.5 mg by mouth 2 (two) times daily.   clonazePAM 0.5 MG disintegrating tablet Commonly known as:  KLONOPIN Take 1 tablet (0.5 mg total) by mouth 2 (two) times daily as needed (anxiety). Replaces:  clonazePAM 0.5 MG tablet   DULoxetine 60 MG capsule Commonly known as:  CYMBALTA Take 60 mg by mouth daily.   ferrous sulfate 325 (65 FE) MG tablet Take 1 tablet (325 mg total) by mouth daily with breakfast.   furosemide  20 MG tablet Commonly known as:  LASIX Take 1 tablet (20 mg total) by mouth daily.   ipratropium-albuterol 0.5-2.5 (3) MG/3ML Soln Commonly known as:  DUONEB Take 3 mLs by nebulization every 6 (six) hours as needed. J44.9   montelukast 10 MG tablet Commonly known as:  SINGULAIR Take 10 mg by mouth at bedtime.   omeprazole 20 MG capsule Commonly known as:  PRILOSEC Take 20 mg by mouth daily.   predniSONE 10 MG tablet Commonly known as:  DELTASONE Take 40mg  daily for 3days,Take 30mg  daily for 3days,Take 20mg  daily for 3days,Take 10mg  daily for 3days, then stop   tiZANidine 2 MG tablet Commonly known as:  ZANAFLEX Take 2-3 mg by mouth every 8 (eight) hours as needed for muscle spasms.   TRELEGY ELLIPTA 100-62.5-25 MCG/INH Aepb Generic drug:  Fluticasone-Umeclidin-Vilant Inhale 1 puff into the lungs daily.            Durable Medical Equipment  (From admission, onward)         Start     Ordered   07/04/18 1046  For home use only DME oxygen  Once    Question Answer Comment  Mode or (Route) Nasal cannula   Liters per Minute 3   Frequency Continuous (stationary and portable oxygen unit needed)   Oxygen delivery system Gas      07/04/18 1045         No Known Allergies Discharge Instructions    DIET DYS 2   Complete by:  As directed    Dysphagia 2 (fine chop);Thin liquid Liquids provided via: Straw Medication Administration: Whole meds with liquid Supervision: Patient able to self feed;Full supervision/cueing for compensatory strategies Compensations: Small sips/bites;Slow rate(take frequent rest breaks for respirations) Postural Changes and/or Swallow Maneuvers: Seated upright 90 degrees   Fluid consistency:  Thin   Discharge instructions   Complete by:  As directed    It is important that you read following instructions as well as go over your medication list with RN to help you understand your care after this hospitalization.  Discharge Instructions: Please  follow-up with PCP in one week  Please request your primary care physician to go over all Hospital Tests and Procedure/Radiological results at the follow up,  Please get all Hospital records sent to your PCP by signing hospital release before you go home.   Do not drive, operating heavy machinery, perform activities at heights, swimming or participation in water activities or provide baby sitting services because while you are on Pain, Sleep and Anxiety Medications; until you have been seen by Primary Care Physician or a Neurologist and advised to do so again. Do not take more than prescribed Pain, Sleep and Anxiety Medications. You were cared for by a hospitalist during your hospital stay.  If you have any questions about your discharge medications or the care you received while you were in the hospital after you are discharged, you can call the unit you were admitted to and ask to speak with the hospitalist on call if the hospitalist that took care of you is not available.  Once you are discharged, your primary care physician will handle any further medical issues. Please note that NO REFILLS for any discharge medications will be authorized once you are discharged, as it is imperative that you return to your primary care physician (or establish a relationship with a primary care physician if you do not have one) for your aftercare needs so that they can reassess your need for medications and monitor your lab values. You Must read complete instructions/literature along with all the possible adverse reactions/side effects for all the Medicines you take and that have been prescribed to you. Take any new Medicines after you have completely understood and accept all the possible adverse reactions/side effects. Wear Seat belts while driving. If you have smoked or chewed Tobacco in the last 2 yrs please stop smoking and/or stop any Recreational drug use.   Increase activity slowly   Complete by:  As  directed      Discharge Exam: Filed Weights   06/22/18 0500 06/25/18 0500 06/26/18 1130  Weight: 42.6 kg 42.8 kg 42.7 kg   Vitals:   07/04/18 0759 07/04/18 0814  BP: 127/73   Pulse: 91   Resp: 16   Temp: 98.3 F (36.8 C)   SpO2: 90% 92%   General: Appear in mild distress, no Rash; Oral Mucosa moist. Cardiovascular: S1 and S2 Present, no Murmur, no JVD Respiratory: Bilateral Air entry present and Clear to Auscultation, no Crackles, no wheezes Abdomen: Bowel Sound present, Soft and no tenderness Extremities: no Pedal edema, no calf tenderness Neurology: Grossly no focal neuro deficit.  The results of significant diagnostics from this hospitalization (including imaging, microbiology, ancillary and laboratory) are listed below for reference.    Significant Diagnostic Studies: Dg Chest Port 1 View  Result Date: 06/26/2018 CLINICAL DATA:  Respiratory failure EXAM: PORTABLE CHEST 1 VIEW COMPARISON:  06/24/2018 FINDINGS: Cardiac shadow is stable. Aortic calcifications are again seen. The lungs are well aerated bilaterally with biapical pleural based density stable from the previous exam. Mild chronic interstitial densities are noted. No focal infiltrate or effusion is seen. No bony abnormality is noted. IMPRESSION: Overall stable appearance when compared with the previous exam. No acute abnormality is noted. Electronically Signed   By: Alcide Clever M.D.   On: 06/26/2018 08:41   Dg Chest Port 1 View  Result Date: 06/24/2018 CLINICAL DATA:  Hypoxia, history asthma, community acquired pneumonia, COPD, hypertension, smoker EXAM: PORTABLE CHEST 1 VIEW COMPARISON:  Portable exam 1819 hours compared to 06/22/2018 and correlated with CT of 03/26/2018 FINDINGS: Enlargement of cardiac silhouette. Atherosclerotic calcification aorta. Mediastinal contours and pulmonary vascularity normal. Emphysematous and chronic interstitial changes probably not significantly changed when accounting for differences  in technique. Biapical scarring and persistent nodular pleural thickening unchanged. BILATERAL nipple shadows. No definite acute infiltrate, pleural effusion or pneumothorax. Bones demineralized. IMPRESSION: Enlargement of cardiac silhouette. COPD changes with chronic accentuation of interstitial markings but no definite acute infiltrate. Pleural based opacities at the apices bilaterally, somewhat nodular in appearance, corresponding to pleural thickening on a relatively recent CT exam, tumor not excluded as noted on prior CT; recommend follow-up PET-CT assessment. Electronically Signed   By: Angelyn Punt.D.  On: 06/24/2018 18:53   Portable Chest X-ray  Result Date: 06/22/2018 CLINICAL DATA:  Endotracheal tube placement. EXAM: PORTABLE CHEST 1 VIEW COMPARISON:  1 day prior FINDINGS: Numerous leads and wires project over the chest. Endotracheal tube terminates 4.4 cm above carina. Nasogastric tube projects over the left mainstem bronchus and is likely endobronchial. Mild cardiomegaly. Atherosclerosis in the transverse aorta. No pleural effusion or pneumothorax. Hyperinflation. No lobar consolidation. Probable nipple shadows projecting over the both lung bases. IMPRESSION: Endotracheal tube appropriately positioned, 4.4 cm above carina. Nasogastric tube projecting over the left mainstem bronchus, likely within the endobronchial tree. Recommend repositioning. Hyperinflation and cardiomegaly, without acute superimposed process. Aortic Atherosclerosis (ICD10-I70.0). These results will be called to the ordering clinician or representative by the Radiologist Assistant, and communication documented in the PACS or zVision Dashboard. Electronically Signed   By: Jeronimo Greaves M.D.   On: 06/22/2018 13:00   Dg Chest Portable 1 View  Result Date: 06/21/2018 CLINICAL DATA:  Respiratory distress. EXAM: PORTABLE CHEST 1 VIEW COMPARISON:  03/25/2018. FINDINGS: 2339 hours. Lungs are hyperexpanded. Interstitial markings are  diffusely coarsened with chronic features. The cardio pericardial silhouette is enlarged. The biapical hazy opacity seen previously is stable. Bones are diffusely demineralized. Telemetry leads overlie the chest. IMPRESSION: Cardiomegaly with emphysema.  No acute cardiopulmonary findings. Electronically Signed   By: Kennith Center M.D.   On: 06/21/2018 23:57   Dg Abd Portable 1v  Result Date: 06/22/2018 CLINICAL DATA:  og tube verification; pt woke from sedation while xray in room; RN had to give more meds due to pt very agitated and wouldn't stay down for imaging. Best image obtained. EXAM: PORTABLE ABDOMEN - 1 VIEW COMPARISON:  06/22/2018 at 1302 hours FINDINGS: Nasal/orogastric tube passes well below the diaphragm. Tip projects in the mid stomach. IMPRESSION: Well-positioned nasal/orogastric tube. Electronically Signed   By: Amie Portland M.D.   On: 06/22/2018 17:45   Dg Abd Portable 1v  Result Date: 06/22/2018 CLINICAL DATA:  OG tube placement EXAM: PORTABLE ABDOMEN - 1 VIEW COMPARISON:  None. FINDINGS: Orogastric tube with the tip projecting over the stomach. There is no bowel dilatation to suggest obstruction. There is no evidence of pneumoperitoneum, portal venous gas or pneumatosis. The osseous structures are unremarkable. IMPRESSION: Orogastric tube with the tip projecting over the stomach. Electronically Signed   By: Elige Ko   On: 06/22/2018 13:35   US Abdomen Limited Ruq  Result Date: 06/22/2018 CLINICAL DATA:  Elevated liver function tests. EXAM: ULTRASOUND ABDOMEN LIMITED RIGHT UPPER QUADRANT COMPARISON:  None. FINDINGS: Examination technically difficult due to patient lying in fetal position throughout the exam and inability to cooperate. Gallbladder: Two tiny nonmobile gallbladder polyps are noted, each measuring approximately 2 mm. No gallstones are identified. No evidence of gallbladder wall thickening or pericholecystic fluid. Common bile duct: Diameter: 2 mm, within normal limits.  Liver: No focal lesion identified. Within normal limits in parenchymal echogenicity. Portal vein is patent on color Doppler imaging with normal direction of blood flow towards the liver. Other: Diffusely increased parenchymal echogenicity of the right kidney noted, suspicious for medical renal disease. IMPRESSION: Technically difficult exam as noted above. No definite gallstones or biliary ductal dilatation. Incidentally noted increased parenchymal echogenicity of the right kidney, suspicious for medical renal disease. Electronically Signed   By: Myles Rosenthal M.D.   On: 06/22/2018 08:01    Microbiology: No results found for this or any previous visit (from the past 240 hour(s)).   Labs: CBC: Recent Labs  Lab  06/28/18 0312 07/03/18 0256  WBC 5.2 10.0  NEUTROABS  --  6.8  HGB 12.8 11.6*  HCT 43.2 39.0  MCV 86.9 89.2  PLT 165 187   Basic Metabolic Panel: Recent Labs  Lab 06/28/18 0312 06/29/18 0539 07/03/18 0256  NA 137  --  142  K 4.8  --  3.7  CL 90*  --  96*  CO2 37*  --  34*  GLUCOSE 107*  --  128*  BUN 18  --  25*  CREATININE 0.50 0.44 0.58  CALCIUM 9.7  --  9.1  MG  --   --  1.9   Liver Function Tests: Recent Labs  Lab 07/03/18 0256  AST 22  ALT 31  ALKPHOS 47  BILITOT 0.4  PROT 6.3*  ALBUMIN 2.9*   No results for input(s): LIPASE, AMYLASE in the last 168 hours. No results for input(s): AMMONIA in the last 168 hours. Cardiac Enzymes: No results for input(s): CKTOTAL, CKMB, CKMBINDEX, TROPONINI in the last 168 hours. BNP (last 3 results) Recent Labs    03/25/18 1927 06/21/18 2339  BNP 1,063.9* 205.6*   CBG: Recent Labs  Lab 07/03/18 1154 07/03/18 1726 07/03/18 2116 07/04/18 0801 07/04/18 1159  GLUCAP 254* 177* 127* 389* 326*   Time spent: 35 minutes  Signed:  Lynden OxfordPranav Michell Giuliano  [facility]  07/04/2018  , 2:19 PM

## 2018-07-05 LAB — GLUCOSE, CAPILLARY: Glucose-Capillary: 92 mg/dL (ref 70–99)

## 2018-07-05 NOTE — Progress Notes (Signed)
NCM received call from patient sister, confirmed address and states patient will need ambulance transport home.  NCM called [facility] for transport and informed RN they are on there way.

## 2018-07-05 NOTE — Plan of Care (Signed)
  Problem: Pain Managment: Goal: General experience of comfort will improve Outcome: Progressing   Problem: Safety: Goal: Ability to remain free from injury will improve Outcome: Progressing   Problem: Education: Goal: Knowledge of General Education information will improve Description Including pain rating scale, medication(s)/side effects and non-pharmacologic comfort measures Outcome: Not Progressing   Problem: Nutrition: Goal: Adequate nutrition will be maintained Outcome: Not Progressing

## 2018-08-12 ENCOUNTER — Ambulatory Visit: Payer: Medicaid Other | Admitting: Pulmonary Disease

## 2018-08-25 ENCOUNTER — Ambulatory Visit (INDEPENDENT_AMBULATORY_CARE_PROVIDER_SITE_OTHER): Payer: Medicaid Other | Admitting: Pulmonary Disease

## 2018-08-25 ENCOUNTER — Encounter: Payer: Self-pay | Admitting: Pulmonary Disease

## 2018-08-25 VITALS — BP 160/90 | HR 85 | Ht 62.0 in | Wt 102.0 lb

## 2018-08-25 DIAGNOSIS — J439 Emphysema, unspecified: Secondary | ICD-10-CM | POA: Diagnosis not present

## 2018-08-25 MED ORDER — IPRATROPIUM-ALBUTEROL 0.5-2.5 (3) MG/3ML IN SOLN: 3.0000 mL | Freq: Four times a day (QID) | RESPIRATORY_TRACT | 2 refills | Status: DC | PRN

## 2018-08-25 NOTE — Progress Notes (Signed)
Vanessa FordVivian A Wallace    [id number]    May 26, 1956  Primary Care Physician:Ramos, Phillip HealArlene G, MD  Referring Physician: No referring provider defined for this encounter.  Chief complaint:   Follow-up for COPD  HPI: 63 year old with history of COPD, asthma, cocaine user, CHF Referred to pulmonary for evaluation of COPD.  Admitted to hospital in September 2019 with COPD exacerbation in the setting of cocaine abuse.  She had a slow improvement with azithromycin, nebulizers, prednisone.  Noted to be in heart failure with volume overload.  She was diuresed during this admission. Continues to have dyspnea on exertion, cough with mucus production.  Denies fevers chills. She also has significant anxiety which is contributing to dyspnea.  Continues to smoke and use cocaine for back pain. Started on Trelegy by her primary care.   Pets: No pets Occupation: Disabled Exposures: No known exposures Smoking history: 40+ smoking history.  Continues to smoke Travel history: No significant travel Relevant family history: No significant family history of lung disease.  Interim history: Admitted to the hospital on 12/8 with COPD exacerbation.  She was briefly on the ventilator Seen by palliative care and made DNR  States that breathing is better.  Continues to have significant dyspnea on exertion, anxiety.  Has chronic cough with white mucus. She could not complete PFTs today due to anxiety.  Outpatient Encounter Medications as of 08/25/2018  Medication Sig  . albuterol (PROVENTIL HFA;VENTOLIN HFA) 108 (90 Base) MCG/ACT inhaler Inhale 1-2 puffs into the lungs every 6 (six) hours as needed for wheezing or shortness of breath.  Marland Kitchen. albuterol (PROVENTIL) (2.5 MG/3ML) 0.083% nebulizer solution Take 2.5 mg by nebulization every 6 (six) hours as needed for wheezing or shortness of breath.   Marland Kitchen. amLODipine (NORVASC) 10 MG tablet Take 1 tablet (10 mg total) by mouth daily.  Marland Kitchen. aspirin 81 MG chewable tablet  Chew 1 tablet (81 mg total) by mouth daily.  . busPIRone (BUSPAR) 7.5 MG tablet Take 7.5 mg by mouth 2 (two) times daily.  . clonazePAM (KLONOPIN) 0.5 MG disintegrating tablet Take 1 tablet (0.5 mg total) by mouth 2 (two) times daily as needed (anxiety).  . DULoxetine (CYMBALTA) 60 MG capsule Take 60 mg by mouth daily.  . ferrous sulfate 325 (65 FE) MG tablet Take 1 tablet (325 mg total) by mouth daily with breakfast.  . Fluticasone-Umeclidin-Vilant (TRELEGY ELLIPTA) 100-62.5-25 MCG/INH AEPB Inhale 1 puff into the lungs daily.  . furosemide (LASIX) 20 MG tablet Take 1 tablet (20 mg total) by mouth daily.  Marland Kitchen. ipratropium-albuterol (DUONEB) 0.5-2.5 (3) MG/3ML SOLN Take 3 mLs by nebulization every 6 (six) hours as needed. J44.9  . lisinopril (PRINIVIL,ZESTRIL) 20 MG tablet Take 20 mg by mouth daily.  . montelukast (SINGULAIR) 10 MG tablet Take 10 mg by mouth at bedtime.  Marland Kitchen. omeprazole (PRILOSEC) 20 MG capsule Take 20 mg by mouth daily.  . ondansetron (ZOFRAN-ODT) 8 MG disintegrating tablet dissolve 1 tablet on tongue every 8 hours as needed for nausea  . predniSONE (DELTASONE) 10 MG tablet Take 40mg  daily for 3days,Take 30mg  daily for 3days,Take 20mg  daily for 3days,Take 10mg  daily for 3days, then stop  . tiZANidine (ZANAFLEX) 2 MG tablet Take 2-3 mg by mouth every 8 (eight) hours as needed for muscle spasms.   No facility-administered encounter medications on file as of 08/25/2018.    Physical Exam: Blood pressure (!) 160/90, pulse 85, height 5\' 2"  (1.575 m), weight 102 lb (46.3 kg), SpO2 98 %.  Gen:      No acute distress HEENT:  EOMI, sclera anicteric Neck:     No masses; no thyromegaly Lungs:    Clear to auscultation bilaterally; normal respiratory effort CV:         Regular rate and rhythm; no murmurs Abd:      + bowel sounds; soft, non-tender; no palpable masses, no distension Ext:    No edema; adequate peripheral perfusion Skin:      Warm and dry; no rash Neuro: alert and oriented x  3 Psych: normal mood and affect  Data Reviewed: Imaging: CT chest 03/26/2018- no PE, soft tissue density at the apices.  Advanced emphysema, cardiomegaly with coronary artery calcification.  T8 compression fracture.  Prominent bilateral hilar lymph nodes. I reviewed the images personally.  PFTs: Spirometry 08/25/2018 FVC 1.3 [50%], FEV1 0.5 [27%), F/F 40 Very severe obstruction.  Labs: CBC 03/25/2018-WBC 5.8, eos 1%, absolute eosinophil count 58  Cardiac: Mild LVH, LVEF 50-55%, PA peak pressure of 67, grade 1 diastolic dysfunction.  Assessment:  Very severe COPD, emphysema Continue Trelegy inhaler Start duo nebs every 4-6 hours as needed Continue supplemental oxygen.  Active tobacco smoker Discussed smoking cessation and emphasized that she needs to quit  Abnormal CT Prior CT this year reviewed with biapical soft tissue densities.  Ideally we would like to get a PET scan for further evaluation but I do not believe she will be able to tolerate it given her severe anxiety. Repeat CT chest without contrast  Goals of care CODE STATUS is DNR.   Plan/Recommendations: - Continue Trelegy, start duo nebs - Smoking cessation  Chilton Greathouse MD [facility] 08/25/2018, 11:49 AM  CC: No ref. provider found

## 2018-08-25 NOTE — Patient Instructions (Signed)
Continue the Trelegy inhaler We will start duo nebs.  He can use this every 4-6 hours as needed for shortness of breath Continue supplemental oxygen Follow-up in 3 months.

## 2018-10-03 ENCOUNTER — Other Ambulatory Visit: Payer: Self-pay | Admitting: *Deleted

## 2018-10-03 DIAGNOSIS — J449 Chronic obstructive pulmonary disease, unspecified: Secondary | ICD-10-CM

## 2018-11-09 ENCOUNTER — Emergency Department (HOSPITAL_COMMUNITY): Payer: Medicaid Other

## 2018-11-09 ENCOUNTER — Observation Stay (HOSPITAL_COMMUNITY): Payer: Medicaid Other

## 2018-11-09 ENCOUNTER — Inpatient Hospital Stay (HOSPITAL_COMMUNITY)
Admission: EM | Admit: 2018-11-09 | Discharge: 2018-11-11 | DRG: 291 | Disposition: A | Discharge status: Home / Self Care | Payer: Medicaid Other | Attending: Family Medicine | Admitting: Family Medicine

## 2018-11-09 ENCOUNTER — Other Ambulatory Visit: Payer: Self-pay

## 2018-11-09 ENCOUNTER — Encounter (HOSPITAL_COMMUNITY): Payer: Self-pay

## 2018-11-09 DIAGNOSIS — M4856XA Collapsed vertebra, not elsewhere classified, lumbar region, initial encounter for fracture: Secondary | ICD-10-CM | POA: Diagnosis present

## 2018-11-09 DIAGNOSIS — Z9981 Dependence on supplemental oxygen: Secondary | ICD-10-CM

## 2018-11-09 DIAGNOSIS — J9612 Chronic respiratory failure with hypercapnia: Secondary | ICD-10-CM | POA: Diagnosis present

## 2018-11-09 DIAGNOSIS — Z7982 Long term (current) use of aspirin: Secondary | ICD-10-CM

## 2018-11-09 DIAGNOSIS — M4854XA Collapsed vertebra, not elsewhere classified, thoracic region, initial encounter for fracture: Secondary | ICD-10-CM | POA: Diagnosis present

## 2018-11-09 DIAGNOSIS — Z66 Do not resuscitate: Secondary | ICD-10-CM | POA: Diagnosis present

## 2018-11-09 DIAGNOSIS — Z20828 Contact with and (suspected) exposure to other viral communicable diseases: Secondary | ICD-10-CM | POA: Diagnosis present

## 2018-11-09 DIAGNOSIS — S32050A Wedge compression fracture of fifth lumbar vertebra, initial encounter for closed fracture: Secondary | ICD-10-CM

## 2018-11-09 DIAGNOSIS — I5043 Acute on chronic combined systolic (congestive) and diastolic (congestive) heart failure: Secondary | ICD-10-CM | POA: Diagnosis not present

## 2018-11-09 DIAGNOSIS — J441 Chronic obstructive pulmonary disease with (acute) exacerbation: Secondary | ICD-10-CM

## 2018-11-09 DIAGNOSIS — M159 Polyosteoarthritis, unspecified: Secondary | ICD-10-CM | POA: Diagnosis present

## 2018-11-09 DIAGNOSIS — I160 Hypertensive urgency: Secondary | ICD-10-CM

## 2018-11-09 DIAGNOSIS — R64 Cachexia: Secondary | ICD-10-CM | POA: Diagnosis present

## 2018-11-09 DIAGNOSIS — Z87891 Personal history of nicotine dependence: Secondary | ICD-10-CM

## 2018-11-09 DIAGNOSIS — J432 Centrilobular emphysema: Secondary | ICD-10-CM | POA: Diagnosis present

## 2018-11-09 DIAGNOSIS — J189 Pneumonia, unspecified organism: Secondary | ICD-10-CM

## 2018-11-09 DIAGNOSIS — I509 Heart failure, unspecified: Secondary | ICD-10-CM

## 2018-11-09 DIAGNOSIS — D509 Iron deficiency anemia, unspecified: Secondary | ICD-10-CM | POA: Diagnosis present

## 2018-11-09 DIAGNOSIS — N182 Chronic kidney disease, stage 2 (mild): Secondary | ICD-10-CM | POA: Diagnosis present

## 2018-11-09 DIAGNOSIS — I130 Hypertensive heart and chronic kidney disease with heart failure and stage 1 through stage 4 chronic kidney disease, or unspecified chronic kidney disease: Principal | ICD-10-CM | POA: Diagnosis present

## 2018-11-09 DIAGNOSIS — Z85118 Personal history of other malignant neoplasm of bronchus and lung: Secondary | ICD-10-CM

## 2018-11-09 DIAGNOSIS — S22000A Wedge compression fracture of unspecified thoracic vertebra, initial encounter for closed fracture: Secondary | ICD-10-CM

## 2018-11-09 DIAGNOSIS — N1330 Unspecified hydronephrosis: Secondary | ICD-10-CM

## 2018-11-09 DIAGNOSIS — J9611 Chronic respiratory failure with hypoxia: Secondary | ICD-10-CM | POA: Diagnosis present

## 2018-11-09 DIAGNOSIS — Z6824 Body mass index (BMI) 24.0-24.9, adult: Secondary | ICD-10-CM

## 2018-11-09 DIAGNOSIS — I5031 Acute diastolic (congestive) heart failure: Secondary | ICD-10-CM

## 2018-11-09 DIAGNOSIS — G43909 Migraine, unspecified, not intractable, without status migrainosus: Secondary | ICD-10-CM | POA: Diagnosis present

## 2018-11-09 DIAGNOSIS — Z7951 Long term (current) use of inhaled steroids: Secondary | ICD-10-CM

## 2018-11-09 DIAGNOSIS — Z808 Family history of malignant neoplasm of other organs or systems: Secondary | ICD-10-CM

## 2018-11-09 LAB — CBC WITH DIFFERENTIAL/PLATELET
Abs Immature Granulocytes: 0.02 10*3/uL (ref 0.00–0.07)
Basophils Absolute: 0 10*3/uL (ref 0.0–0.1)
Basophils Relative: 1 %
Eosinophils Absolute: 0.1 10*3/uL (ref 0.0–0.5)
Eosinophils Relative: 2 %
HCT: 31.4 % — ABNORMAL LOW (ref 36.0–46.0)
Hemoglobin: 8.6 g/dL — ABNORMAL LOW (ref 12.0–15.0)
Immature Granulocytes: 0 %
Lymphocytes Relative: 30 %
Lymphs Abs: 2 10*3/uL (ref 0.7–4.0)
MCH: 21.6 pg — ABNORMAL LOW (ref 26.0–34.0)
MCHC: 27.4 g/dL — ABNORMAL LOW (ref 30.0–36.0)
MCV: 78.9 fL — ABNORMAL LOW (ref 80.0–100.0)
Monocytes Absolute: 0.5 10*3/uL (ref 0.1–1.0)
Monocytes Relative: 7 %
Neutro Abs: 4 10*3/uL (ref 1.7–7.7)
Neutrophils Relative %: 60 %
Platelets: 213 10*3/uL (ref 150–400)
RBC: 3.98 MIL/uL (ref 3.87–5.11)
RDW: 20.6 % — ABNORMAL HIGH (ref 11.5–15.5)
WBC: 6.7 10*3/uL (ref 4.0–10.5)
nRBC: 0 % (ref 0.0–0.2)

## 2018-11-09 LAB — COMPREHENSIVE METABOLIC PANEL
ALT: 20 U/L (ref 0–44)
AST: 27 U/L (ref 15–41)
Albumin: 4 g/dL (ref 3.5–5.0)
Alkaline Phosphatase: 58 U/L (ref 38–126)
Anion gap: 5 (ref 5–15)
BUN: 19 mg/dL (ref 8–23)
CO2: 32 mmol/L (ref 22–32)
Calcium: 9 mg/dL (ref 8.9–10.3)
Chloride: 102 mmol/L (ref 98–111)
Creatinine, Ser: 0.67 mg/dL (ref 0.44–1.00)
GFR calc Af Amer: >60 mL/min (ref >60)
GFR calc non Af Amer: >60 mL/min (ref >60)
Glucose, Bld: 76 mg/dL (ref 70–99)
Potassium: 4.5 mmol/L (ref 3.5–5.1)
Sodium: 139 mmol/L (ref 135–145)
Total Bilirubin: 0.6 mg/dL (ref 0.3–1.2)
Total Protein: 7.9 g/dL (ref 6.5–8.1)

## 2018-11-09 LAB — SARS CORONAVIRUS 2 BY RT PCR (HOSPITAL ORDER, PERFORMED IN ~~LOC~~ HOSPITAL LAB): SARS Coronavirus 2: NEGATIVE

## 2018-11-09 LAB — BRAIN NATRIURETIC PEPTIDE: B Natriuretic Peptide: 589.6 pg/mL — ABNORMAL HIGH (ref 0.0–100.0)

## 2018-11-09 MED ORDER — IPRATROPIUM-ALBUTEROL 20-100 MCG/ACT IN AERS
1.0000 | INHALATION_SPRAY | Freq: Four times a day (QID) | RESPIRATORY_TRACT | Status: DC
  Filled 2018-11-09: qty 4

## 2018-11-09 MED ORDER — ENOXAPARIN SODIUM 30 MG/0.3ML ~~LOC~~ SOLN
30.0000 mg | SUBCUTANEOUS | Status: DC
  Administered 2018-11-10: 30 mg via SUBCUTANEOUS
  Filled 2018-11-09: qty 0.3

## 2018-11-09 MED ORDER — METHYLPREDNISOLONE SODIUM SUCC 125 MG IJ SOLR
125.0000 mg | Freq: Once | INTRAMUSCULAR | Status: AC
  Administered 2018-11-09: 125 mg via INTRAVENOUS
  Filled 2018-11-09: qty 2

## 2018-11-09 MED ORDER — ALBUTEROL SULFATE HFA 108 (90 BASE) MCG/ACT IN AERS
8.0000 | INHALATION_SPRAY | Freq: Once | RESPIRATORY_TRACT | Status: AC
  Administered 2018-11-09: 21:00:00 8 via RESPIRATORY_TRACT
  Filled 2018-11-09: qty 6.7

## 2018-11-09 MED ORDER — FUROSEMIDE 10 MG/ML IJ SOLN
40.0000 mg | Freq: Two times a day (BID) | INTRAMUSCULAR | Status: DC
  Administered 2018-11-10: 40 mg via INTRAVENOUS
  Filled 2018-11-09: qty 4

## 2018-11-09 MED ORDER — ALBUTEROL SULFATE HFA 108 (90 BASE) MCG/ACT IN AERS: 8.0000 | INHALATION_SPRAY | RESPIRATORY_TRACT | Status: DC | PRN

## 2018-11-09 MED ORDER — LORAZEPAM 0.5 MG PO TABS
0.2500 mg | ORAL_TABLET | Freq: Once | ORAL | Status: AC
  Administered 2018-11-09: 18:00:00 0.25 mg via ORAL
  Filled 2018-11-09: qty 1

## 2018-11-09 MED ORDER — METHYLPREDNISOLONE SODIUM SUCC 125 MG IJ SOLR
60.0000 mg | Freq: Three times a day (TID) | INTRAMUSCULAR | Status: DC
  Administered 2018-11-10 – 2018-11-11 (×4): 60 mg via INTRAVENOUS
  Filled 2018-11-09 (×4): qty 2

## 2018-11-09 MED ORDER — FUROSEMIDE 10 MG/ML IJ SOLN
60.0000 mg | Freq: Once | INTRAMUSCULAR | Status: AC
  Administered 2018-11-09: 21:00:00 60 mg via INTRAVENOUS
  Filled 2018-11-09 (×2): qty 8

## 2018-11-09 NOTE — ED Notes (Signed)
Bed: WA21 Expected date:  Expected time:  Means of arrival:  Comments: Ran out of O2

## 2018-11-09 NOTE — H&P (Signed)
History and Physical    Vanessa Wallace ZOX:[id number] DOB: Jan 16, 1956 DOA: 11/09/2018  PCP: Regino Bellow, MD Patient coming from: Home  Chief Complaint: " My oxygen ran out."  HPI: Vanessa Wallace is a 63 y.o. 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  Patient states she ran out of her home oxygen earlier today.  She has no complaints.  Denies having any shortness of breath or cough.  Denies orthopnea or lower extremity edema.  Denies chest pain, fevers, chills, sick contacts, recent travel, or exposure to any individual with suspected or confirmed COVID-19.  Does mention that she ran out of Lasix a few days ago and also ran out of 1 of her COPD inhalers a few days ago.  During her previous hospitalization in December 2019 for COPD exacerbation patient was intubated and admitted to the ICU.  After extubation her respiratory status remained very tenuous.  Palliative care was involved and had multiple discussions with the patient regarding her chronic end-stage lung disease.  Hospice was recommended but patient was not agreeable.  She is DNR.    Review of Systems: As per HPI otherwise 10 point review of systems negative.  Past Medical History:  Diagnosis Date  . Anxiety   . Arthritis    "all over" (09/17/2013)  . Asthma   . CAP (community acquired pneumonia) 09/17/2013  . Chronic bronchitis (HCC)    "flares up w/my asthma" (09/17/2013)  . Cocaine abuse (HCC)   . COPD (chronic obstructive pulmonary disease) (HCC)   . Hypertension   . Migraines    "weekly lately" (09/17/2013)    Past Surgical History:  Procedure Laterality Date  . ABDOMINAL HYSTERECTOMY     "partial"  . BUNIONECTOMY Bilateral    "right one came back after  OR" (09/17/2013)  . LACERATION REPAIR Left 06/1999   Repair FPL, radial digital nerve, intrinsics, left thumb./notes 06/29/1999 (09/17/2013)  . TUBAL LIGATION       reports that she quit smoking about 2 months ago. Her smoking use included cigarettes. She has a 12.00 pack-year smoking history. She has never used smokeless tobacco. She reports current drug use. Drugs: Marijuana and Cocaine. She reports that she does not drink alcohol.  No Known Allergies  Family History  Problem Relation Age of Onset  . Brain cancer Mother     Prior to Admission medications   Medication Sig Start Date End Date Taking? Authorizing Provider  albuterol (PROVENTIL HFA;VENTOLIN HFA) 108 (90 Base) MCG/ACT inhaler Inhale 1-2 puffs into the lungs every 6 (six) hours as needed for wheezing or shortness of breath. 08/15/17   Penny Pia, MD  albuterol (PROVENTIL) (2.5 MG/3ML) 0.083% nebulizer solution Take 2.5 mg by nebulization every 6 (six) hours as needed for wheezing or shortness of breath.     [provider]  amLODipine (NORVASC) 10 MG tablet Take 1 tablet (10 mg total) by mouth daily. 07/05/18   Rolly Salter, MD  aspirin 81 MG chewable tablet Chew 1 tablet (81 mg total) by mouth daily. 07/05/18   Rolly Salter, MD  busPIRone (BUSPAR) 7.5 MG tablet Take 7.5 mg by mouth 2 (two) times daily. 06/16/18   [provider]  clonazePAM (KLONOPIN) 0.5 MG disintegrating tablet Take 1 tablet (0.5 mg total) by  mouth 2 (two) times daily as needed (anxiety). 07/04/18   Rolly SalterPatel, Pranav M, MD  DULoxetine (CYMBALTA) 60 MG capsule Take 60 mg by mouth daily. 07/22/17   [provider]  ferrous sulfate 325 (65 FE) MG tablet Take 1 tablet (325 mg total) by mouth daily with breakfast. 09/01/16   Rhetta MuraSamtani, [name], MD  Fluticasone-Umeclidin-Vilant (TRELEGY ELLIPTA) 100-62.5-25 MCG/INH AEPB Inhale 1 puff into the lungs daily.    [provider]  furosemide (LASIX) 20 MG tablet Take 1 tablet (20 mg  total) by mouth daily. 03/30/18   Delano MetzSchertz, Robert, MD  ipratropium-albuterol (DUONEB) 0.5-2.5 (3) MG/3ML SOLN Take 3 mLs by nebulization every 6 (six) hours as needed. J44.9 08/25/18   Mannam, Colbert CoyerPraveen, MD  lisinopril (PRINIVIL,ZESTRIL) 20 MG tablet Take 20 mg by mouth daily. 08/13/18   [provider]  montelukast (SINGULAIR) 10 MG tablet Take 10 mg by mouth at bedtime.    [provider]  omeprazole (PRILOSEC) 20 MG capsule Take 20 mg by mouth daily.    [provider]  ondansetron (ZOFRAN-ODT) 8 MG disintegrating tablet dissolve 1 tablet on tongue every 8 hours as needed for nausea 08/11/18   [provider]  predniSONE (DELTASONE) 10 MG tablet Take 40mg  daily for 3days,Take 30mg  daily for 3days,Take 20mg  daily for 3days,Take 10mg  daily for 3days, then stop 07/04/18   Rolly SalterPatel, Pranav M, MD  tiZANidine (ZANAFLEX) 2 MG tablet Take 2-3 mg by mouth every 8 (eight) hours as needed for muscle spasms. 06/16/18   [provider]    Physical Exam: Vitals:   11/09/18 1937 11/09/18 2102 11/09/18 2230 11/09/18 2330  BP: (!) 165/99 (!) 166/99 (!) 148/95 (!) 160/98  Pulse: 82 90 99 85  Resp: 16 16 20 18   Temp:      TempSrc:      SpO2: 100% 100% 97% 96%  Weight:      Height:        Physical Exam  Constitutional: She is oriented to person, place, and time.  HENT:  Head: Normocephalic.  Eyes: Right eye exhibits no discharge. Left eye exhibits no discharge.  Neck: Neck supple. JVD present.  Cardiovascular: Normal rate, regular rhythm and intact distal pulses.  Pulmonary/Chest: She is in respiratory distress. She has no wheezes.  Slightly tachypneic with increased work of breathing Able to speak clearly in full sentences Decreased breath sounds appreciated bilaterally on auscultation  Abdominal: Soft. Bowel sounds are normal. She exhibits no distension. There is no abdominal tenderness. There is no guarding.  Musculoskeletal:        General: No edema.   Neurological: She is alert and oriented to person, place, and time.  Moving all extremities spontaneously.  Sensation to light touch intact throughout.  No focal deficit.  Skin: Skin is warm and dry. She is not diaphoretic.     Labs on Admission: I have personally reviewed following labs and imaging studies  CBC: Recent Labs  Lab 11/09/18 1745  WBC 6.7  NEUTROABS 4.0  HGB 8.6*  HCT 31.4*  MCV 78.9*  PLT 213   Basic Metabolic Panel: Recent Labs  Lab 11/09/18 1745  NA 139  K 4.5  CL 102  CO2 32  GLUCOSE 76  BUN 19  CREATININE 0.67  CALCIUM 9.0   GFR: Estimated Creatinine Clearance: 63.4 mL/min (by C-G formula based on SCr of 0.67 mg/dL). Liver Function Tests: Recent Labs  Lab 11/09/18 1745  AST 27  ALT 20  ALKPHOS 58  BILITOT 0.6  PROT 7.9  ALBUMIN 4.0   No results for input(s): LIPASE, AMYLASE in the last 168 hours. No results for input(s): AMMONIA in the last 168 hours. Coagulation Profile: No results for input(s): INR, PROTIME in the last 168 hours. Cardiac Enzymes: No results for input(s): CKTOTAL, CKMB, CKMBINDEX, TROPONINI in the last 168 hours. BNP (last 3 results) No results for input(s): PROBNP in the last 8760 hours. HbA1C: No results for input(s): HGBA1C in the last 72 hours. CBG: No results for input(s): GLUCAP in the last 168 hours. Lipid Profile: No results for input(s): CHOL, HDL, LDLCALC, TRIG, CHOLHDL, LDLDIRECT in the last 72 hours. Thyroid Function Tests: No results for input(s): TSH, T4TOTAL, FREET4, T3FREE, THYROIDAB in the last 72 hours. Anemia Panel: No results for input(s): VITAMINB12, FOLATE, FERRITIN, TIBC, IRON, RETICCTPCT in the last 72 hours. Urine analysis: No results found for: COLORURINE, APPEARANCEUR, LABSPEC, PHURINE, GLUCOSEU, HGBUR, BILIRUBINUR, KETONESUR, PROTEINUR, UROBILINOGEN, NITRITE, LEUKOCYTESUR  Radiological Exams on Admission: Ct Chest Wo Contrast  Result Date: 11/09/2018 CLINICAL DATA:  63 year old  female with shortness of breath. Pending COVID-19 test. EXAM: CT CHEST WITHOUT CONTRAST TECHNIQUE: Multidetector CT imaging of the chest was performed following the standard protocol without IV contrast. COMPARISON:  AP chest radiograph earlier today. Chest CT 03/26/2018. FINDINGS: Cardiovascular: Calcified aortic and coronary artery atherosclerosis. Vascular patency is not evaluated in the absence of IV contrast. Stable mild cardiomegaly. No pericardial effusion. Mediastinum/Nodes: Negative noncontrast mediastinum. Lungs/Pleura: Chronic centrilobular emphysema. Stable lung volumes. Major airways are patent. Scattered chronic subpleural scarring, most pronounced in the anterior upper lobes and posterior left lower lobe. No pleural effusion. Chronic scarring in the medial segment of the right middle lobe. No acute ground-glass or other pulmonary opacity. Upper Abdomen: Negative visible noncontrast liver, spleen, pancreas. The left renal collecting system may be duplicated. Renal collecting systems are partially visible but appear larger than on the prior. Musculoskeletal: Widespread thoracic and upper lumbar compression fractures. T3 and T6 through T8 compression fractures are stable. There are new generally mild compression fractures at T2, T9, T10, and L1 since September. Osteopenia. Ribs and sternum appear stable. IMPRESSION: 1. No acute pulmonary abnormality. Chronic lung disease with Emphysema (ICD10-J43.9). 2. Osteopenia with new superior endplate compression fractures T2, T9, T10, and L1 since September. 3. Possible new renal hydronephrosis. Is there acute renal insufficiency? 4. Calcified coronary artery and Aortic atherosclerosis (ICD10-I70.0). Electronically Signed   By: Odessa Fleming M.D.   On: 11/09/2018 22:02   Dg Chest Portable 1 View  Result Date: 11/09/2018 CLINICAL DATA:  Shortness of breath. History of COPD, lung cancer and home oxygen. EXAM: PORTABLE CHEST 1 VIEW COMPARISON:  Radiographs 06/26/2018  and 06/24/2018.  CT 03/26/2018. FINDINGS: 1734 hours. The cardiac silhouette appears larger than on the most recent study, although this may be accentuated by lordotic positioning currently. The mediastinal contours are stable. The lungs are hyperinflated but clear. There is stable pleural thickening at both apices. No pneumothorax or significant pleural effusion. Telemetry leads overlie the chest. IMPRESSION: 1. Possible mild increase in the heart size, suggesting cardiomegaly or pericardial effusion. This difference could be technical based on lordotic positioning. 2. Chronic obstructive disease without acute pulmonary findings. Electronically Signed   By: Carey Bullocks M.D.   On: 11/09/2018 18:50    EKG: Independently reviewed.  Sinus rhythm, nonspecific T wave abnormality in V3.  Assessment/Plan Principal Problem:   Acute exacerbation of CHF (congestive heart failure) (HCC) Active Problems:   COPD exacerbation (HCC)   Compression fracture of  body of thoracic vertebra (HCC)   Compression fracture of fifth lumbar vertebra (HCC)   Hypertensive urgency   Acute exacerbation of chronic combined systolic and diastolic congestive heart failure and acute exacerbation of chronic obstructive pulmonary disease Patient has chronic hypoxic hypercarbic respiratory failure on 4 L supplemental oxygen chronically secondary to end-stage COPD.  Last echo done in September 2019 showing slightly reduced LVEF of 50 to 55% and grade 1 diastolic dysfunction.  She ran out of her home oxygen which was also contributing to her dyspnea.  In addition, reports nonadherence with home Lasix and one of her COPD inhalers.  She desatted to the 80s on 4 L supplemental oxygen in the ED and briefly required nonrebreather.  Currently back on 4 L supplemental oxygen and satting well.  No wheezing but has decreased air entry bilaterally upon auscultation of the lungs.  Afebrile.  No leukocytosis.  No lymphopenia.  Chest x-ray showing  possible mild increase in heart size, suggesting cardiomegaly or pericardial effusion, although study with technical limitations.  Per ED provider, no effusion or tamponade noted on bedside echocardiogram.  Chest CT showing no acute pulmonary abnormality.  Chronic lung disease with emphysema. BNP 589, above baseline.  Has signs of volume overload on exam including JVD.  SARS-CoV-2 test negative.   -Received Lasix 60 mg in the ED.  Continue IV Lasix 40 mg twice daily starting in the morning.  Reassess in a.m. and adjust dose of Lasix accordingly.  Continue to monitor renal function with IV diuresis.  Monitor intake and output, daily weights, and low-sodium diet with fluid restriction.  Repeat echocardiogram. -Received IV Solu-Medrol 125 mg once in the ED.  Continue Solu-Medrol 60 mg every 8 hours.  Duo nebs every 6 hours.  Albuterol nebulizer every 4 hours as needed.  Will hold off antibiotics at this time given no fever or leukocytosis. -Continue supplemental oxygen -Palliative care consultation -Patient has threatened to leave AMA multiple times in the ED.  I have had 2 lengthy discussions with her about the importance of staying in the hospital at this time.  Patient is AAO x4 and completely understands that leaving the hospital would be AGAINST MEDICAL ADVICE.  She has decided to stay in the hospital for further management at this time.  Vertebral compression fractures of thoracic and lumbar spine CT with finding of new superior endplate compression fractures of T2, T9, T10, and L1 since September.  Patient is not complaining of any back pain at this time.  No focal neuro deficit. -Tylenol PRN  ?  Hydronephrosis CT with possible new renal hydronephrosis.  Renal function at baseline.  BUN 19, creatinine 0.6. -Monitor intake and output -Renal ultrasound pending  Hypertensive urgency Blood pressure elevated on arrival at 192/108.  Now improved after IV Lasix. -Continue IV Lasix for diuresis  -Hydralazine PRN  Microcytic anemia Hemoglobin 8.6, MCV 78.9.  Hemoglobin was normal 4 months ago.  No signs of active bleeding.  -Anemia panel -Continue to monitor CBC  Unable to safely order home medications at this time as pharmacy medication reconciliation is pending.  DVT prophylaxis: Lovenox Code Status: DNR and DNI.  Confirmed with the patient. Family Communication: No family available. Disposition Plan: Anticipate discharge after clinical improvement. Consults called: None Admission status: Observation, telemetry  This chart was dictated using voice recognition software.  Despite best efforts to proofread, errors can occur which can change the documentation meaning.  John Giovanni MD [facility] Pager [phone]  If 7PM-7AM, please contact  night-coverage [email or web address] Password [email or web address]  11/10/2018, 1:18 AM

## 2018-11-09 NOTE — ED Notes (Signed)
Dr. Loney Loh made aware of patient's intent to leave. MD had a conversation with patient and patient agreed to stay. Will continue to monitor patient.

## 2018-11-09 NOTE — ED Notes (Signed)
Pt has requested to be released so the she can go home

## 2018-11-09 NOTE — ED Provider Notes (Signed)
[facility]-EMERGENCY DEPT Provider Note   CSN: [id number] Arrival date & time: 11/09/18  1601    History   Chief Complaint No chief complaint on file.   HPI Vanessa Wallace is a 63 y.o. female.     HPI   63 year old female with a history of COPD with chronic hypoxic and hypercarbic respiratory failure on 4 L of oxygen at home, chronic diastolic heart failure, drug abuse, depression with anxiety, hypertension, who presents with shortness of breath that developed after she ran out of her oxygen at home.  Patient reports that she is here solely because she ran out of her oxygen at home and began to feel short of breath after her oxygen ran out.  [facility] at the time that EMS was picking her up, and she was brought to the emergency department.  She denies chest pain, fevers, cough, congestion, body aches, leg pain or swelling.  Reports that she has a history of anxiety, and feels that is contributing to her symptoms.  Past Medical History:  Diagnosis Date  . Anxiety   . Arthritis    "all over" (09/17/2013)  . Asthma   . CAP (community acquired pneumonia) 09/17/2013  . Chronic bronchitis (HCC)    "flares up w/my asthma" (09/17/2013)  . Cocaine abuse (HCC)   . COPD (chronic obstructive pulmonary disease) (HCC)   . Hypertension   . Migraines    "weekly lately" (09/17/2013)    Patient Active Problem List   Diagnosis Date Noted  . SOB (shortness of breath) 11/09/2018  . COPD with acute exacerbation (HCC) 06/22/2018  . Anxiety 06/22/2018  . Transaminasemia 06/22/2018  . Essential hypertension   . Acute respiratory failure with hypercapnia (HCC)   . Polysubstance abuse (HCC)   . Palliative care encounter   . Fluid overload 03/25/2018  . Protein-calorie malnutrition, severe 08/12/2017  . Protein-calorie malnutrition, moderate (HCC) 08/08/2017  . Iron deficiency anemia 08/08/2017  . Depression 08/08/2017  . Acute on chronic  respiratory failure with hypoxia and hypercapnia (HCC) 08/08/2017  . COPD exacerbation (HCC) 08/30/2016  . COPD (chronic obstructive pulmonary disease) (HCC) 08/30/2016  . Asthma exacerbation 10/02/2014  . Renal insufficiency   . Chest wall pain   . Cough   . Cocaine use 09/22/2013  . Pneumonia 09/17/2013  . Community acquired pneumonia 09/17/2013    Past Surgical History:  Procedure Laterality Date  . ABDOMINAL HYSTERECTOMY     "partial"  . BUNIONECTOMY Bilateral    "right one came back after OR" (09/17/2013)  . LACERATION REPAIR Left 06/1999   Repair FPL, radial digital nerve, intrinsics, left thumb./notes 06/29/1999 (09/17/2013)  . TUBAL LIGATION       OB History   No obstetric history on file.      Home Medications    Prior to Admission medications   Medication Sig Start Date End Date Taking? Authorizing Provider  omeprazole (PRILOSEC) 20 MG capsule Take 20 mg by mouth daily.   Yes [provider]  albuterol (PROVENTIL HFA;VENTOLIN HFA) 108 (90 Base) MCG/ACT inhaler Inhale 1-2 puffs into the lungs every 6 (six) hours as needed for wheezing or shortness of breath. 08/15/17   Penny Pia, MD  albuterol (PROVENTIL) (2.5 MG/3ML) 0.083% nebulizer solution Take 2.5 mg by nebulization every 6 (six) hours as needed for wheezing or shortness of breath.     [provider]  amLODipine (NORVASC) 10 MG tablet Take 1 tablet (10 mg total) by mouth  daily. 07/05/18   Rolly Salter, MD  amLODipine (NORVASC) 5 MG tablet Take 5 mg by mouth daily. 09/12/18   [provider]  aspirin 81 MG chewable tablet Chew 1 tablet (81 mg total) by mouth daily. 07/05/18   Rolly Salter, MD  busPIRone (BUSPAR) 7.5 MG tablet Take 7.5 mg by mouth 2 (two) times daily. 06/16/18   [provider]  clonazePAM (KLONOPIN) 0.5 MG disintegrating tablet Take 1 tablet (0.5 mg total) by mouth 2 (two) times daily as needed (anxiety). 07/04/18   Rolly Salter, MD  clonazePAM  (KLONOPIN) 0.5 MG tablet TAKE 1 TABLET 2 TO 3 TIMES A DAY 09/15/18   [provider]  DULoxetine (CYMBALTA) 60 MG capsule Take 60 mg by mouth daily. 07/22/17   [provider]  ferrous sulfate 325 (65 FE) MG tablet Take 1 tablet (325 mg total) by mouth daily with breakfast. 09/01/16   [name], MD  Fluticasone-Umeclidin-Vilant (TRELEGY ELLIPTA) 100-62.5-25 MCG/INH AEPB Inhale 1 puff into the lungs daily.    [provider]  furosemide (LASIX) 20 MG tablet Take 1 tablet (20 mg total) by mouth daily. 03/30/18   Delano Metz, MD  ipratropium-albuterol (DUONEB) 0.5-2.5 (3) MG/3ML SOLN Take 3 mLs by nebulization every 6 (six) hours as needed. J44.9 08/25/18   Mannam, Colbert Coyer, MD  lisinopril (PRINIVIL,ZESTRIL) 20 MG tablet Take 20 mg by mouth daily. 08/13/18   [provider]  montelukast (SINGULAIR) 10 MG tablet Take 10 mg by mouth at bedtime.    [provider]  ondansetron (ZOFRAN-ODT) 8 MG disintegrating tablet dissolve 1 tablet on tongue every 8 hours as needed for nausea 08/11/18   [provider]  predniSONE (DELTASONE) 10 MG tablet Take 40mg  daily for 3days,Take 30mg  daily for 3days,Take 20mg  daily for 3days,Take 10mg  daily for 3days, then stop 07/04/18   Rolly Salter, MD  Saline (NASOGEL) GEL Apply to inside the nose, four times a day 08/11/18   [provider]  tiZANidine (ZANAFLEX) 2 MG tablet Take 2-3 mg by mouth every 8 (eight) hours as needed for muscle spasms. 06/16/18   [provider]    Family History Family History  Problem Relation Age of Onset  . Brain cancer Mother     Social History Social History   Tobacco Use  . Smoking status: Former Smoker    Packs/day: 0.30    Years: 40.00    Pack years: 12.00    Types: Cigarettes    Last attempt to quit: 08/11/2018    Years since quitting: 0.2  . Smokeless tobacco: Never Used  . Tobacco comment: 09/17/2013 "weaned down from 2 ppd of cigarettes"   Substance Use Topics  . Alcohol use: No    Alcohol/week: 6.0 standard drinks    Types: 6 Cans of beer per week    Comment: Quit 2 weeks  . Drug use: Yes    Types: Marijuana, Cocaine     Allergies   Patient has no known allergies.   Review of Systems Review of Systems  Constitutional: Negative for fever.  HENT: Negative for sore throat.   Eyes: Negative for visual disturbance.  Respiratory: Positive for shortness of breath. Negative for cough.   Cardiovascular: Negative for chest pain and leg swelling.  Gastrointestinal: Negative for abdominal pain.  Genitourinary: Negative for difficulty urinating.  Musculoskeletal: Positive for back pain (chronic). Negative for neck pain.  Skin: Negative for rash.  Neurological: Negative for syncope and headaches.  Psychiatric/Behavioral: The patient  is nervous/anxious.      Physical Exam Updated Vital Signs BP (!) 166/99 (BP Location: Right Arm)   Pulse 90   Temp 98.4 F (36.9 C) (Oral)   Resp 16   Ht  (1.575 m)   Wt 60.8 kg   SpO2 100%   BMI 24.51 kg/m   Physical Exam Vitals signs and nursing note reviewed.  Constitutional:      General: She is not in acute distress.    Appearance: She is well-developed. She is not diaphoretic.  HENT:     Head: Normocephalic and atraumatic.  Eyes:     Conjunctiva/sclera: Conjunctivae normal.  Neck:     Musculoskeletal: Normal range of motion.     Vascular: JVD present.  Cardiovascular:     Rate and Rhythm: Normal rate and regular rhythm.     Heart sounds: Normal heart sounds.  Pulmonary:     Effort: Tachypnea and accessory muscle usage present.     Breath sounds: Normal breath sounds. Decreased air movement (throughout) present. No wheezing or rales.  Abdominal:     General: There is no distension.     Palpations: Abdomen is soft.     Tenderness: There is no abdominal tenderness. There is no guarding.  Musculoskeletal:        General: No tenderness.  Skin:    General: Skin  is warm and dry.     Findings: No erythema or rash.  Neurological:     Mental Status: She is alert and oriented to person, place, and time.      ED Treatments / Results  Labs (all labs ordered are listed, but only abnormal results are displayed) Labs Reviewed  CBC WITH DIFFERENTIAL/PLATELET - Abnormal; Notable for the following components:      Result Value   Hemoglobin 8.6 (*)    HCT 31.4 (*)    MCV 78.9 (*)    MCH 21.6 (*)    MCHC 27.4 (*)    RDW 20.6 (*)    All other components within normal limits  BRAIN NATRIURETIC PEPTIDE - Abnormal; Notable for the following components:   B Natriuretic Peptide 589.6 (*)    All other components within normal limits  SARS CORONAVIRUS 2 (HOSPITAL ORDER, PERFORMED IN [facility])  COMPREHENSIVE METABOLIC PANEL  HIV ANTIBODY (ROUTINE TESTING W REFLEX)  C-REACTIVE PROTEIN  D-DIMER, QUANTITATIVE (NOT AT [facility])  FERRITIN  FIBRINOGEN  LACTATE DEHYDROGENASE  PROCALCITONIN  TROPONIN I  IRON AND TIBC  FERRITIN  CBC  BASIC METABOLIC PANEL  ABO/RH    EKG EKG Interpretation  Date/Time:  Sunday November 09 2018 16:54:22 EDT Ventricular Rate:  83 PR Interval:    QRS Duration: 78 QT Interval:  405 QTC Calculation: 476 R Axis:   81 Text Interpretation:  Sinus rhythm Consider right atrial enlargement Borderline right axis deviation Left ventricular hypertrophy Nonspecific T abnormalities, inferior leads No significant change since last tracing Confirmed by Alvira Monday ([id number]) on 11/09/2018 5:54:16 PM   Radiology Dg Chest Portable 1 View  Result Date: 11/09/2018 CLINICAL DATA:  Shortness of breath. History of COPD, lung cancer and home oxygen. EXAM: PORTABLE CHEST 1 VIEW COMPARISON:  Radiographs 06/26/2018 and 06/24/2018.  CT 03/26/2018. FINDINGS: 1734 hours. The cardiac silhouette appears larger than on the most recent study, although this may be accentuated by lordotic positioning currently. The mediastinal contours are  stable. The lungs are hyperinflated but clear. There is stable pleural thickening at both apices. No pneumothorax or significant pleural  effusion. Telemetry leads overlie the chest. IMPRESSION: 1. Possible mild increase in the heart size, suggesting cardiomegaly or pericardial effusion. This difference could be technical based on lordotic positioning. 2. Chronic obstructive disease without acute pulmonary findings. Electronically Signed   By: Carey BullocksWilliam  Veazey M.D.   On: 11/09/2018 18:50    Procedures .Critical Care Performed by: Alvira MondaySchlossman, Zaina Jenkin, MD Authorized by: Alvira MondaySchlossman, Sandro Burgo, MD   Critical care provider statement:    Critical care time (minutes):  30   Critical care was necessary to treat or prevent imminent or life-threatening deterioration of the following conditions:  Respiratory failure   Critical care was time spent personally by me on the following activities:  Evaluation of patient's response to treatment, examination of patient, ordering and performing treatments and interventions, ordering and review of laboratory studies, ordering and review of radiographic studies, pulse oximetry, re-evaluation of patient's condition, obtaining history from patient or surrogate and review of old charts   (including critical care time)  Medications Ordered in ED Medications  Ipratropium-Albuterol (COMBIVENT) respimat 1 puff (has no administration in time range)  albuterol (VENTOLIN HFA) 108 (90 Base) MCG/ACT inhaler 8 puff (has no administration in time range)  methylPREDNISolone sodium succinate (SOLU-MEDROL) 125 mg/2 mL injection 60 mg (has no administration in time range)  furosemide (LASIX) injection 40 mg (has no administration in time range)  enoxaparin (LOVENOX) injection 40 mg (has no administration in time range)  LORazepam (ATIVAN) tablet 0.25 mg (0.25 mg Oral Given 11/09/18 1806)  methylPREDNISolone sodium succinate (SOLU-MEDROL) 125 mg/2 mL injection 125 mg (125 mg Intravenous Given  11/09/18 2038)  albuterol (VENTOLIN HFA) 108 (90 Base) MCG/ACT inhaler 8 puff (8 puffs Inhalation Given 11/09/18 2038)  furosemide (LASIX) injection 60 mg (60 mg Intravenous Given 11/09/18 2038)     Initial Impression / Assessment and Plan / ED Course  I have reviewed the triage vital signs and the nursing notes.  Pertinent labs & imaging results that were available during my care of the patient were reviewed by me and considered in my medical decision making (see chart for details).       63 year old female with a history of COPD with chronic hypoxic and hypercarbic respiratory failure on 4 L of oxygen at home, chronic diastolic heart failure, drug abuse, depression with anxiety, hypertension, who presents with shortness of breath that developed after she ran out of her oxygen at home.  Initially on arrival, patient reports that she would like to leave the emergency department as she was only here because she did not have her oxygen, however while patient is on 4 L of oxygen in the emergency department, she has significant tachypnea, accessory muscle use, and is unable to speak in full sentences.  While she was in the emergency department, she also had an episode of desaturation down to the low 80s, and was briefly placed on nonrebreather with improvement, and was titrated back to her 4 L of oxygen.  On exam, she has significant JVD, mild bilateral lower extremity edema, and diminished lung sounds throughout.  I feel her history and exam are most consistent with COPD and CHF exacerbation.  BNP 596, increased from December 205.  Bedside ultrasound was performed which does not show evidence of tamponade or large pericardial effusion.  Given albuterol MDI, Solu-Medrol, and Lasix.  We will admit for continued care.  Given dyspnea, COVID19 testing ordered.  Final Clinical Impressions(s) / ED Diagnoses   Final diagnoses:  Acute diastolic heart failure (HCC)  COPD exacerbation (  [facility])    ED  Discharge Orders    None       Alvira Monday, MD 11/09/18 2127

## 2018-11-09 NOTE — ED Triage Notes (Signed)
Patient presented to ed with c/o shortness of breath at home. Patient state she run out of oxygen at home today. Hx of COPD and lung cancer. Patient is on 4 L of oxygen at home.

## 2018-11-09 NOTE — ED Notes (Signed)
Patient brief changed. Peri care performed and purewick applied.

## 2018-11-10 ENCOUNTER — Encounter (HOSPITAL_COMMUNITY): Payer: Self-pay

## 2018-11-10 ENCOUNTER — Inpatient Hospital Stay (HOSPITAL_COMMUNITY): Payer: Medicaid Other

## 2018-11-10 ENCOUNTER — Observation Stay (HOSPITAL_COMMUNITY): Payer: Medicaid Other

## 2018-11-10 DIAGNOSIS — Z85118 Personal history of other malignant neoplasm of bronchus and lung: Secondary | ICD-10-CM | POA: Diagnosis not present

## 2018-11-10 DIAGNOSIS — R0602 Shortness of breath: Secondary | ICD-10-CM | POA: Diagnosis present

## 2018-11-10 DIAGNOSIS — R64 Cachexia: Secondary | ICD-10-CM | POA: Diagnosis present

## 2018-11-10 DIAGNOSIS — J432 Centrilobular emphysema: Secondary | ICD-10-CM | POA: Diagnosis present

## 2018-11-10 DIAGNOSIS — I160 Hypertensive urgency: Secondary | ICD-10-CM | POA: Diagnosis present

## 2018-11-10 DIAGNOSIS — Z808 Family history of malignant neoplasm of other organs or systems: Secondary | ICD-10-CM | POA: Diagnosis not present

## 2018-11-10 DIAGNOSIS — M4854XA Collapsed vertebra, not elsewhere classified, thoracic region, initial encounter for fracture: Secondary | ICD-10-CM | POA: Diagnosis present

## 2018-11-10 DIAGNOSIS — I5031 Acute diastolic (congestive) heart failure: Secondary | ICD-10-CM | POA: Diagnosis not present

## 2018-11-10 DIAGNOSIS — I5043 Acute on chronic combined systolic (congestive) and diastolic (congestive) heart failure: Secondary | ICD-10-CM | POA: Diagnosis not present

## 2018-11-10 DIAGNOSIS — Z20828 Contact with and (suspected) exposure to other viral communicable diseases: Secondary | ICD-10-CM | POA: Diagnosis present

## 2018-11-10 DIAGNOSIS — M159 Polyosteoarthritis, unspecified: Secondary | ICD-10-CM | POA: Diagnosis present

## 2018-11-10 DIAGNOSIS — Z6824 Body mass index (BMI) 24.0-24.9, adult: Secondary | ICD-10-CM | POA: Diagnosis not present

## 2018-11-10 DIAGNOSIS — Z7982 Long term (current) use of aspirin: Secondary | ICD-10-CM | POA: Diagnosis not present

## 2018-11-10 DIAGNOSIS — Z7951 Long term (current) use of inhaled steroids: Secondary | ICD-10-CM | POA: Diagnosis not present

## 2018-11-10 DIAGNOSIS — Z87891 Personal history of nicotine dependence: Secondary | ICD-10-CM | POA: Diagnosis not present

## 2018-11-10 DIAGNOSIS — Z66 Do not resuscitate: Secondary | ICD-10-CM | POA: Diagnosis present

## 2018-11-10 DIAGNOSIS — I509 Heart failure, unspecified: Secondary | ICD-10-CM

## 2018-11-10 DIAGNOSIS — J9612 Chronic respiratory failure with hypercapnia: Secondary | ICD-10-CM | POA: Diagnosis present

## 2018-11-10 DIAGNOSIS — J9611 Chronic respiratory failure with hypoxia: Secondary | ICD-10-CM | POA: Diagnosis present

## 2018-11-10 DIAGNOSIS — G43909 Migraine, unspecified, not intractable, without status migrainosus: Secondary | ICD-10-CM | POA: Diagnosis present

## 2018-11-10 DIAGNOSIS — Z9981 Dependence on supplemental oxygen: Secondary | ICD-10-CM | POA: Diagnosis not present

## 2018-11-10 DIAGNOSIS — S32050A Wedge compression fracture of fifth lumbar vertebra, initial encounter for closed fracture: Secondary | ICD-10-CM

## 2018-11-10 DIAGNOSIS — S22000A Wedge compression fracture of unspecified thoracic vertebra, initial encounter for closed fracture: Secondary | ICD-10-CM

## 2018-11-10 DIAGNOSIS — I130 Hypertensive heart and chronic kidney disease with heart failure and stage 1 through stage 4 chronic kidney disease, or unspecified chronic kidney disease: Secondary | ICD-10-CM | POA: Diagnosis not present

## 2018-11-10 DIAGNOSIS — N182 Chronic kidney disease, stage 2 (mild): Secondary | ICD-10-CM | POA: Diagnosis present

## 2018-11-10 DIAGNOSIS — D509 Iron deficiency anemia, unspecified: Secondary | ICD-10-CM | POA: Diagnosis present

## 2018-11-10 DIAGNOSIS — M4856XA Collapsed vertebra, not elsewhere classified, lumbar region, initial encounter for fracture: Secondary | ICD-10-CM | POA: Diagnosis present

## 2018-11-10 LAB — CBC
HCT: 36 % (ref 36.0–46.0)
Hemoglobin: 9.8 g/dL — ABNORMAL LOW (ref 12.0–15.0)
MCH: 21.3 pg — ABNORMAL LOW (ref 26.0–34.0)
MCHC: 27.2 g/dL — ABNORMAL LOW (ref 30.0–36.0)
MCV: 78.3 fL — ABNORMAL LOW (ref 80.0–100.0)
Platelets: 242 10*3/uL (ref 150–400)
RBC: 4.6 MIL/uL (ref 3.87–5.11)
RDW: 20.4 % — ABNORMAL HIGH (ref 11.5–15.5)
WBC: 4.9 10*3/uL (ref 4.0–10.5)
nRBC: 0 % (ref 0.0–0.2)

## 2018-11-10 LAB — IRON AND TIBC
Iron: 16 ug/dL — ABNORMAL LOW (ref 28–170)
Saturation Ratios: 3 % — ABNORMAL LOW (ref 10.4–31.8)
TIBC: 608 ug/dL — ABNORMAL HIGH (ref 250–450)
UIBC: 592 ug/dL

## 2018-11-10 LAB — MRSA PCR SCREENING: MRSA by PCR: POSITIVE — AB

## 2018-11-10 LAB — BASIC METABOLIC PANEL
Anion gap: 10 (ref 5–15)
BUN: 21 mg/dL (ref 8–23)
CO2: 33 mmol/L — ABNORMAL HIGH (ref 22–32)
Calcium: 9.6 mg/dL (ref 8.9–10.3)
Chloride: 98 mmol/L (ref 98–111)
Creatinine, Ser: 0.77 mg/dL (ref 0.44–1.00)
GFR calc Af Amer: >60 mL/min (ref >60)
GFR calc non Af Amer: >60 mL/min (ref >60)
Glucose, Bld: 108 mg/dL — ABNORMAL HIGH (ref 70–99)
Potassium: 4.7 mmol/L (ref 3.5–5.1)
Sodium: 141 mmol/L (ref 135–145)

## 2018-11-10 LAB — HIV ANTIBODY (ROUTINE TESTING W REFLEX): HIV Screen 4th Generation wRfx: NONREACTIVE

## 2018-11-10 LAB — ECHOCARDIOGRAM COMPLETE
Height: 62 in
Weight: 1555.57 oz

## 2018-11-10 LAB — FERRITIN: Ferritin: 9 ng/mL — ABNORMAL LOW (ref 11–307)

## 2018-11-10 MED ORDER — LEVALBUTEROL HCL 1.25 MG/0.5ML IN NEBU
1.2500 mg | INHALATION_SOLUTION | Freq: Four times a day (QID) | RESPIRATORY_TRACT | Status: DC
  Administered 2018-11-10 – 2018-11-11 (×4): 1.25 mg via RESPIRATORY_TRACT
  Filled 2018-11-10 (×4): qty 0.5

## 2018-11-10 MED ORDER — LORAZEPAM 2 MG/ML IJ SOLN
INTRAMUSCULAR | Status: AC
  Administered 2018-11-10: 1 mg
  Filled 2018-11-10: qty 1

## 2018-11-10 MED ORDER — LEVALBUTEROL HCL 1.25 MG/0.5ML IN NEBU: 1.2500 mg | INHALATION_SOLUTION | Freq: Four times a day (QID) | RESPIRATORY_TRACT | Status: DC

## 2018-11-10 MED ORDER — IPRATROPIUM-ALBUTEROL 0.5-2.5 (3) MG/3ML IN SOLN
RESPIRATORY_TRACT | Status: AC
  Filled 2018-11-10: qty 3

## 2018-11-10 MED ORDER — HYDRALAZINE HCL 20 MG/ML IJ SOLN
5.0000 mg | INTRAMUSCULAR | Status: DC | PRN
  Administered 2018-11-10: 5 mg via INTRAVENOUS
  Filled 2018-11-10: qty 1

## 2018-11-10 MED ORDER — SODIUM CHLORIDE 0.9 % IV SOLN
125.0000 mg | Freq: Once | INTRAVENOUS | Status: AC
  Administered 2018-11-10: 125 mg via INTRAVENOUS
  Filled 2018-11-10: qty 10

## 2018-11-10 MED ORDER — IPRATROPIUM-ALBUTEROL 0.5-2.5 (3) MG/3ML IN SOLN
3.0000 mL | Freq: Four times a day (QID) | RESPIRATORY_TRACT | Status: DC
  Administered 2018-11-10: 3 mL via RESPIRATORY_TRACT
  Filled 2018-11-10: qty 3

## 2018-11-10 MED ORDER — IPRATROPIUM BROMIDE 0.02 % IN SOLN
0.5000 mg | Freq: Four times a day (QID) | RESPIRATORY_TRACT | Status: DC
  Administered 2018-11-10 – 2018-11-11 (×4): 0.5 mg via RESPIRATORY_TRACT
  Filled 2018-11-10 (×4): qty 2.5

## 2018-11-10 MED ORDER — ALBUTEROL SULFATE (2.5 MG/3ML) 0.083% IN NEBU: 2.5000 mg | INHALATION_SOLUTION | RESPIRATORY_TRACT | Status: DC | PRN

## 2018-11-10 MED ORDER — CHLORHEXIDINE GLUCONATE CLOTH 2 % EX PADS
6.0000 | MEDICATED_PAD | Freq: Every day | CUTANEOUS | Status: DC
  Administered 2018-11-10 – 2018-11-11 (×2): 6 via TOPICAL

## 2018-11-10 MED ORDER — CLONAZEPAM 0.125 MG PO TBDP
0.5000 mg | ORAL_TABLET | Freq: Two times a day (BID) | ORAL | Status: DC | PRN
  Administered 2018-11-10 – 2018-11-11 (×4): 0.5 mg via ORAL
  Filled 2018-11-10 (×4): qty 4

## 2018-11-10 MED ORDER — MUPIROCIN 2 % EX OINT
1.0000 "application " | TOPICAL_OINTMENT | Freq: Two times a day (BID) | CUTANEOUS | Status: DC
  Administered 2018-11-10 (×2): 1 via NASAL
  Filled 2018-11-10: qty 22

## 2018-11-10 MED ORDER — LORAZEPAM 2 MG/ML IJ SOLN
1.0000 mg | Freq: Once | INTRAMUSCULAR | Status: AC
  Administered 2018-11-10: 1 mg via INTRAVENOUS

## 2018-11-10 MED ORDER — ALBUTEROL SULFATE (2.5 MG/3ML) 0.083% IN NEBU
2.5000 mg | INHALATION_SOLUTION | RESPIRATORY_TRACT | Status: DC | PRN
  Administered 2018-11-10: 2.5 mg via RESPIRATORY_TRACT
  Filled 2018-11-10: qty 3

## 2018-11-10 NOTE — Progress Notes (Signed)
  Echocardiogram 2D Echocardiogram has been performed.  Leta Jungling M 11/10/2018, 2:47 PM

## 2018-11-10 NOTE — Progress Notes (Signed)
Entered room to give patient ordered breathing treatment and RN in present in room.  Patient states that she can't take a treatment because she is having a panic attack and she wants something to help "calm her down".  Suggested that a treatment might help with her anxiety and she stated that she did not want a treatment.

## 2018-11-10 NOTE — Progress Notes (Signed)
Lovenox per Pharmacy for DVT Prophylaxis    Pharmacy has been consulted from dosing enoxaparin (lovenox) in this patient for DVT prophylaxis.  The pharmacist has reviewed pertinent labs (Hgb _8.6__; PLT_213__), patient weight (__44_kg) and renal function (CrCl_51__mL/min) and decided that enoxaparin _30_mg SQ Q24Hrs is appropriate for this patient.  The pharmacy department will sign off at this time.  Please reconsult pharmacy if status changes or for further issues.  Thank you  Luetta Nutting PharmD, BCPS  11/10/2018, 1:49 AM

## 2018-11-10 NOTE — Progress Notes (Addendum)
Provider gave V.O to give dose of Klonopin 0.5 mg. Medication administered.  Patient complained of worsening anxiety. Vs assessed wnl. Provider at bedside gave V.O to administer Ativan 1 mg IV. After returning provider asked IV ativan be held at this time. Will continue to monitor.
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
## 2018-11-10 NOTE — CV Procedure (Signed)
Echocardiogram not completed. Vanessa Wallace is experiencing respiratory distress will re-attempt later today.  Leta Jungling RDCS

## 2018-11-10 NOTE — Progress Notes (Addendum)
CRITICAL VALUE ALERT  Critical Value:  MRSA (+)  Date & Time Notied:  11/10/18 0925  Provider Notified: samtani  Orders Received/Actions taken: protocol entered

## 2018-11-10 NOTE — Progress Notes (Signed)
Palliative Medicine Team consult was received.   Reviewed chart and attempted to meet with patient, however, she was off the floor for testing.  A member of PMT will meet with patient as soon as possible.  If there are urgent needs or questions please call [phone]. Thank you for consulting out team to assist with this patients care.  Romie Minus, MD [facility] Palliative Medicine Team [phone]  NO CHARGE NOTE

## 2018-11-10 NOTE — ED Notes (Signed)
[place] at bedside. Will take patient upstairs once complete.

## 2018-11-11 MED ORDER — FLUTICASONE-UMECLIDIN-VILANT 100-62.5-25 MCG/INH IN AEPB: 1.0000 | INHALATION_SPRAY | Freq: Every day | RESPIRATORY_TRACT | 3 refills | Status: AC

## 2018-11-11 MED ORDER — PREDNISONE 10 MG PO TABS: ORAL_TABLET | ORAL | 0 refills | Status: AC

## 2018-11-11 MED ORDER — AMLODIPINE BESYLATE 10 MG PO TABS: 10.0000 mg | ORAL_TABLET | Freq: Every day | ORAL | 3 refills | Status: AC

## 2018-11-11 MED ORDER — MONTELUKAST SODIUM 10 MG PO TABS: 10.0000 mg | ORAL_TABLET | Freq: Every day | ORAL | 1 refills | Status: AC

## 2018-11-11 MED ORDER — CLONAZEPAM 0.5 MG PO TBDP: 0.5000 mg | ORAL_TABLET | Freq: Two times a day (BID) | ORAL | 0 refills | Status: AC | PRN

## 2018-11-11 MED ORDER — ALBUTEROL SULFATE HFA 108 (90 BASE) MCG/ACT IN AERS: 1.0000 | INHALATION_SPRAY | Freq: Four times a day (QID) | RESPIRATORY_TRACT | 3 refills | Status: AC | PRN
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
## 2018-11-11 NOTE — Progress Notes (Signed)
SATURATION QUALIFICATIONS: (This note is used to comply with regulatory documentation for home oxygen)  Patient Saturations on Room Air at Rest = 86%  Patient Saturations on Room Air while Ambulating = N/A  Patient Saturations on 4 Liters of oxygen while Ambulating = 96%  Please briefly explain why patient needs home oxygen: Pt desated to 86% without oxygen while lying in bed.

## 2018-11-16 ENCOUNTER — Emergency Department (HOSPITAL_COMMUNITY): Payer: Medicaid Other

## 2018-11-16 ENCOUNTER — Encounter (HOSPITAL_COMMUNITY): Payer: Self-pay

## 2018-11-16 ENCOUNTER — Observation Stay (HOSPITAL_COMMUNITY)
Admission: EM | Admit: 2018-11-16 | Discharge: 2018-11-17 | Disposition: A | Discharge status: Home / Self Care | Payer: Medicaid Other | Attending: Internal Medicine | Admitting: Internal Medicine

## 2018-11-16 ENCOUNTER — Other Ambulatory Visit: Payer: Self-pay

## 2018-11-16 DIAGNOSIS — Z87891 Personal history of nicotine dependence: Secondary | ICD-10-CM | POA: Insufficient documentation

## 2018-11-16 DIAGNOSIS — F419 Anxiety disorder, unspecified: Secondary | ICD-10-CM | POA: Diagnosis not present

## 2018-11-16 DIAGNOSIS — Z85118 Personal history of other malignant neoplasm of bronchus and lung: Secondary | ICD-10-CM | POA: Diagnosis not present

## 2018-11-16 DIAGNOSIS — R0602 Shortness of breath: Secondary | ICD-10-CM

## 2018-11-16 DIAGNOSIS — R400 Somnolence: Secondary | ICD-10-CM | POA: Diagnosis not present

## 2018-11-16 DIAGNOSIS — Z7952 Long term (current) use of systemic steroids: Secondary | ICD-10-CM | POA: Insufficient documentation

## 2018-11-16 DIAGNOSIS — J9622 Acute and chronic respiratory failure with hypercapnia: Secondary | ICD-10-CM | POA: Diagnosis present

## 2018-11-16 DIAGNOSIS — Z9981 Dependence on supplemental oxygen: Secondary | ICD-10-CM | POA: Diagnosis not present

## 2018-11-16 DIAGNOSIS — Z79899 Other long term (current) drug therapy: Secondary | ICD-10-CM | POA: Insufficient documentation

## 2018-11-16 DIAGNOSIS — R4182 Altered mental status, unspecified: Secondary | ICD-10-CM | POA: Diagnosis present

## 2018-11-16 DIAGNOSIS — Z66 Do not resuscitate: Secondary | ICD-10-CM | POA: Diagnosis not present

## 2018-11-16 DIAGNOSIS — G43909 Migraine, unspecified, not intractable, without status migrainosus: Secondary | ICD-10-CM | POA: Diagnosis not present

## 2018-11-16 DIAGNOSIS — I110 Hypertensive heart disease with heart failure: Secondary | ICD-10-CM | POA: Diagnosis not present

## 2018-11-16 DIAGNOSIS — F329 Major depressive disorder, single episode, unspecified: Secondary | ICD-10-CM | POA: Insufficient documentation

## 2018-11-16 DIAGNOSIS — Z7951 Long term (current) use of inhaled steroids: Secondary | ICD-10-CM | POA: Insufficient documentation

## 2018-11-16 DIAGNOSIS — I700 Atherosclerosis of aorta: Secondary | ICD-10-CM | POA: Insufficient documentation

## 2018-11-16 DIAGNOSIS — F1910 Other psychoactive substance abuse, uncomplicated: Secondary | ICD-10-CM | POA: Diagnosis present

## 2018-11-16 DIAGNOSIS — D509 Iron deficiency anemia, unspecified: Secondary | ICD-10-CM | POA: Diagnosis not present

## 2018-11-16 DIAGNOSIS — F1410 Cocaine abuse, uncomplicated: Secondary | ICD-10-CM | POA: Diagnosis not present

## 2018-11-16 DIAGNOSIS — M549 Dorsalgia, unspecified: Secondary | ICD-10-CM | POA: Diagnosis present

## 2018-11-16 DIAGNOSIS — I5032 Chronic diastolic (congestive) heart failure: Secondary | ICD-10-CM

## 2018-11-16 DIAGNOSIS — Z20828 Contact with and (suspected) exposure to other viral communicable diseases: Secondary | ICD-10-CM | POA: Insufficient documentation

## 2018-11-16 DIAGNOSIS — M1990 Unspecified osteoarthritis, unspecified site: Secondary | ICD-10-CM | POA: Insufficient documentation

## 2018-11-16 DIAGNOSIS — M8580 Other specified disorders of bone density and structure, unspecified site: Secondary | ICD-10-CM | POA: Diagnosis not present

## 2018-11-16 DIAGNOSIS — G4710 Hypersomnia, unspecified: Secondary | ICD-10-CM | POA: Diagnosis present

## 2018-11-16 DIAGNOSIS — I10 Essential (primary) hypertension: Secondary | ICD-10-CM | POA: Diagnosis present

## 2018-11-16 DIAGNOSIS — J449 Chronic obstructive pulmonary disease, unspecified: Secondary | ICD-10-CM | POA: Diagnosis present

## 2018-11-16 DIAGNOSIS — J441 Chronic obstructive pulmonary disease with (acute) exacerbation: Principal | ICD-10-CM | POA: Insufficient documentation

## 2018-11-16 DIAGNOSIS — F32A Depression, unspecified: Secondary | ICD-10-CM | POA: Diagnosis present

## 2018-11-16 LAB — URINALYSIS, ROUTINE W REFLEX MICROSCOPIC
Bacteria, UA: NONE SEEN
Bilirubin Urine: NEGATIVE
Glucose, UA: NEGATIVE mg/dL
Hgb urine dipstick: NEGATIVE
Ketones, ur: 20 mg/dL — AB
Leukocytes,Ua: NEGATIVE
Nitrite: NEGATIVE
Protein, ur: 30 mg/dL — AB
Specific Gravity, Urine: 1.011 (ref 1.005–1.030)
pH: 7 (ref 5.0–8.0)

## 2018-11-16 LAB — BLOOD GAS, VENOUS
Acid-Base Excess: 6.5 mmol/L — ABNORMAL HIGH (ref 0.0–2.0)
Bicarbonate: 34.9 mmol/L — ABNORMAL HIGH (ref 20.0–28.0)
O2 Saturation: 68.2 %
Patient temperature: 37
pCO2, Ven: 72.8 mmHg (ref 44.0–60.0)
pH, Ven: 7.3 (ref 7.250–7.430)
pO2, Ven: 43.3 mmHg (ref 32.0–45.0)

## 2018-11-16 LAB — CBC
HCT: 35.1 % — ABNORMAL LOW (ref 36.0–46.0)
Hemoglobin: 9.5 g/dL — ABNORMAL LOW (ref 12.0–15.0)
MCH: 22.4 pg — ABNORMAL LOW (ref 26.0–34.0)
MCHC: 27.1 g/dL — ABNORMAL LOW (ref 30.0–36.0)
MCV: 82.8 fL (ref 80.0–100.0)
Platelets: 275 10*3/uL (ref 150–400)
RBC: 4.24 MIL/uL (ref 3.87–5.11)
RDW: 24 % — ABNORMAL HIGH (ref 11.5–15.5)
WBC: 11.8 10*3/uL — ABNORMAL HIGH (ref 4.0–10.5)
nRBC: 0.2 % (ref 0.0–0.2)

## 2018-11-16 LAB — BASIC METABOLIC PANEL
Anion gap: 6 (ref 5–15)
BUN: 18 mg/dL (ref 8–23)
CO2: 32 mmol/L (ref 22–32)
Calcium: 8.5 mg/dL — ABNORMAL LOW (ref 8.9–10.3)
Chloride: 103 mmol/L (ref 98–111)
Creatinine, Ser: 0.59 mg/dL (ref 0.44–1.00)
GFR calc Af Amer: >60 mL/min (ref >60)
GFR calc non Af Amer: >60 mL/min (ref >60)
Glucose, Bld: 94 mg/dL (ref 70–99)
Potassium: 4.1 mmol/L (ref 3.5–5.1)
Sodium: 141 mmol/L (ref 135–145)

## 2018-11-16 LAB — TROPONIN I: Troponin I: <0.03 ng/mL (ref <0.03)

## 2018-11-16 LAB — RAPID URINE DRUG SCREEN, HOSP PERFORMED
Amphetamines: NOT DETECTED
Barbiturates: NOT DETECTED
Benzodiazepines: NOT DETECTED
Cocaine: POSITIVE — AB
Opiates: NOT DETECTED
Tetrahydrocannabinol: NOT DETECTED

## 2018-11-16 LAB — SARS CORONAVIRUS 2 BY RT PCR (HOSPITAL ORDER, PERFORMED IN ~~LOC~~ HOSPITAL LAB): SARS Coronavirus 2: NEGATIVE

## 2018-11-16 LAB — BRAIN NATRIURETIC PEPTIDE: B Natriuretic Peptide: 306.3 pg/mL — ABNORMAL HIGH (ref 0.0–100.0)

## 2018-11-16 MED ORDER — BUSPIRONE HCL 5 MG PO TABS
7.5000 mg | ORAL_TABLET | Freq: Two times a day (BID) | ORAL | Status: DC
  Administered 2018-11-17 (×2): 7.5 mg via ORAL
  Filled 2018-11-16 (×2): qty 2

## 2018-11-16 MED ORDER — FLUTICASONE-UMECLIDIN-VILANT 100-62.5-25 MCG/INH IN AEPB: 1.0000 | INHALATION_SPRAY | Freq: Every day | RESPIRATORY_TRACT | Status: DC

## 2018-11-16 MED ORDER — UMECLIDINIUM BROMIDE 62.5 MCG/INH IN AEPB
1.0000 | INHALATION_SPRAY | Freq: Every day | RESPIRATORY_TRACT | Status: DC
  Filled 2018-11-16: qty 7

## 2018-11-16 MED ORDER — METHYLPREDNISOLONE SODIUM SUCC 40 MG IJ SOLR
40.0000 mg | Freq: Once | INTRAMUSCULAR | Status: AC
  Administered 2018-11-17: 40 mg via INTRAVENOUS
  Filled 2018-11-16: qty 1

## 2018-11-16 MED ORDER — MONTELUKAST SODIUM 10 MG PO TABS
10.0000 mg | ORAL_TABLET | Freq: Every day | ORAL | Status: DC
  Administered 2018-11-17: 10 mg via ORAL
  Filled 2018-11-16: qty 1

## 2018-11-16 MED ORDER — FERROUS SULFATE 325 (65 FE) MG PO TABS
325.0000 mg | ORAL_TABLET | Freq: Every day | ORAL | Status: DC
  Administered 2018-11-17: 325 mg via ORAL
  Filled 2018-11-16: qty 1

## 2018-11-16 MED ORDER — PREDNISONE 20 MG PO TABS
60.0000 mg | ORAL_TABLET | Freq: Once | ORAL | Status: AC
  Administered 2018-11-16: 18:00:00 60 mg via ORAL
  Filled 2018-11-16: qty 3

## 2018-11-16 MED ORDER — ACETAMINOPHEN 325 MG PO TABS: 650.0000 mg | ORAL_TABLET | Freq: Four times a day (QID) | ORAL | Status: DC | PRN

## 2018-11-16 MED ORDER — LACTATED RINGERS IV SOLN
INTRAVENOUS | Status: AC
  Administered 2018-11-17: 02:00:00 via INTRAVENOUS

## 2018-11-16 MED ORDER — IPRATROPIUM-ALBUTEROL 0.5-2.5 (3) MG/3ML IN SOLN: 3.0000 mL | Freq: Once | RESPIRATORY_TRACT | Status: DC

## 2018-11-16 MED ORDER — DULOXETINE HCL 30 MG PO CPEP
60.0000 mg | ORAL_CAPSULE | Freq: Every day | ORAL | Status: DC
  Administered 2018-11-17 (×2): 60 mg via ORAL
  Filled 2018-11-16 (×2): qty 2

## 2018-11-16 MED ORDER — PANTOPRAZOLE SODIUM 40 MG PO TBEC
40.0000 mg | DELAYED_RELEASE_TABLET | Freq: Every day | ORAL | Status: DC
  Administered 2018-11-17 (×2): 40 mg via ORAL
  Filled 2018-11-16 (×2): qty 1

## 2018-11-16 MED ORDER — ALBUTEROL SULFATE HFA 108 (90 BASE) MCG/ACT IN AERS
2.0000 | INHALATION_SPRAY | Freq: Once | RESPIRATORY_TRACT | Status: AC
  Administered 2018-11-16: 18:00:00 2 via RESPIRATORY_TRACT
  Filled 2018-11-16: qty 6.7

## 2018-11-16 MED ORDER — ALBUTEROL SULFATE (2.5 MG/3ML) 0.083% IN NEBU: 3.0000 mL | INHALATION_SOLUTION | Freq: Four times a day (QID) | RESPIRATORY_TRACT | Status: DC | PRN

## 2018-11-16 MED ORDER — AMLODIPINE BESYLATE 5 MG PO TABS
10.0000 mg | ORAL_TABLET | Freq: Every day | ORAL | Status: DC
  Administered 2018-11-17: 10 mg via ORAL
  Filled 2018-11-16: qty 2

## 2018-11-16 MED ORDER — ONDANSETRON HCL 4 MG PO TABS: 4.0000 mg | ORAL_TABLET | Freq: Four times a day (QID) | ORAL | Status: DC | PRN

## 2018-11-16 MED ORDER — ONDANSETRON HCL 4 MG/2ML IJ SOLN: 4.0000 mg | Freq: Four times a day (QID) | INTRAMUSCULAR | Status: DC | PRN

## 2018-11-16 MED ORDER — ACETAMINOPHEN 650 MG RE SUPP: 650.0000 mg | Freq: Four times a day (QID) | RECTAL | Status: DC | PRN

## 2018-11-16 MED ORDER — ENOXAPARIN SODIUM 40 MG/0.4ML ~~LOC~~ SOLN
40.0000 mg | Freq: Every day | SUBCUTANEOUS | Status: DC
  Administered 2018-11-17: 40 mg via SUBCUTANEOUS
  Filled 2018-11-16: qty 0.4

## 2018-11-16 MED ORDER — FLUTICASONE FUROATE-VILANTEROL 100-25 MCG/INH IN AEPB
1.0000 | INHALATION_SPRAY | Freq: Every day | RESPIRATORY_TRACT | Status: DC
  Filled 2018-11-16: qty 28

## 2018-11-16 NOTE — ED Notes (Signed)
Attempted to give report. RN unavailable and will call back

## 2018-11-16 NOTE — ED Triage Notes (Signed)
Pt brought in via EMS c/o shob, anxiety. Pt has hx of COPD, lung CA, stimulant drug abuse.

## 2018-11-16 NOTE — Progress Notes (Signed)
Per MD wait tell Covid -19 test comes back and then placed pt on BIPAP if negative. Pt on 4LPM  [place] with no respiratory stress noted.

## 2018-11-16 NOTE — ED Notes (Signed)
Admitting MD discontinuing bipap orders. 4th floor charge rn made aware

## 2018-11-16 NOTE — ED Notes (Signed)
Pt heard screaming from her room "help me help me. I need that pill. " Rn went into the room to assist pt, and pt stated she was having a panic attack, couldn't breathe and needed a pill. Pt fanned and therapeutic communication was used to help control pt's breathing. Pt used two puffs of her inhaler. Pt currently stable and breathing better.

## 2018-11-16 NOTE — ED Notes (Signed)
Bed: WA09 Expected date:  Expected time:  Means of arrival:  Comments: Wheezing

## 2018-11-16 NOTE — H&P (Signed)
History and Physical    Vanessa Wallace:[id number] DOB: [birth year] DOA: 11/16/2018  PCP: Vanessa Bellow, MD  Patient coming from: Home via EMS  I have personally briefly reviewed patient's old medical records in [facility]  Chief Complaint: Altered mental status  HPI: Vanessa Wallace is a 63 y.o. female with medical history significant for COPD with chronic hypoxic hypercarbic respiratory failure on 2-4 L supplemental O2 via [place] at home, chronic diastolic CHF (EF 31-51%), hypertension, iron deficiency anemia, anxiety and depression, and substance use disorder who was brought to the ED with altered mental status/somnolence.  History is limited from patient due to somnolence, therefore majority of history is obtained from EDP and chart review.  Patient apparently presented to the ED for lower back pain which began after moving her mattress yesterday.  She has known compression fractures of T2, T9, T10, and L1 which are new findings by CT chest on 11/09/2018.  She has not had numbness or weakness to the arms or legs, or bowel/bladder dysfunction.  She will intermittently nod her head yes or no.  She nods her head no when asked if she has had recent shortness of breath, chest pain, dysuria.  She briefly sat up when told she has been admitted to the hospital, otherwise remained somnolent lying on her left side in bed.  She was recently admitted from 426-11/11/2018 for acute on chronic COPD exacerbation for which she was discharged on a prednisone taper.  She had tested negative for SARS-CoV-2 on 11/09/2018.  ED Course:  Initial vitals showed BP 140/86, pulse 88, RR 22, temp 99.3 Fahrenheit, SPO2 98% on 2 L supplemental O2 via [place].  Labs are notable for WBC 11.8, hemoglobin 9.5, platelets 275, bicarb 32, BUN 18, creatinine 0.59, troponin I <0.03, BNP 306.3.  VBG showed pH 7.3, PCO2 72.8, PO2 43.3.  SARS-CoV-2 testing was obtained and currently pending.  Portable chest x-ray showed chronic  bronchitic changes, hyperinflated lung fields without focal consolidation, effusion, or edema.  She was given albuterol inhaler treatment and oral prednisone 60 mg once.  The hospital service was consulted tonight for further evaluation management of metabolic encephalopathy with hypersomnolence.  Review of Systems:  Review of systems limited due to altered mental status.   Past Medical History:  Diagnosis Date  . Anxiety   . Arthritis    "all over" (09/17/2013)  . Asthma   . CAP (community acquired pneumonia) 09/17/2013  . Chronic bronchitis (HCC)    "flares up w/my asthma" (09/17/2013)  . Cocaine abuse (HCC)   . COPD (chronic obstructive pulmonary disease) (HCC)   . Hypertension   . Migraines    "weekly lately" (09/17/2013)    Past Surgical History:  Procedure Laterality Date  . ABDOMINAL HYSTERECTOMY     "partial"  . BUNIONECTOMY Bilateral    "right one came back after OR" (09/17/2013)  . LACERATION REPAIR Left 06/1999   Repair FPL, radial digital nerve, intrinsics, left thumb./notes 06/29/1999 (09/17/2013)  . TUBAL LIGATION       reports that she quit smoking about 3 months ago. Her smoking use included cigarettes. She has a 12.00 pack-year smoking history. She has never used smokeless tobacco. She reports current drug use. Drugs: Marijuana and Cocaine. She reports that she does not drink alcohol.  No Known Allergies  Family History  Problem Relation Age of Onset  . Brain cancer Mother      Prior to Admission medications   Medication Sig Start Date End  Date Taking? Authorizing Provider  albuterol (VENTOLIN HFA) 108 (90 Base) MCG/ACT inhaler Inhale 1-2 puffs into the lungs every 6 (six) hours as needed for wheezing or shortness of breath. 11/11/18  Yes Rhetta MuraSamtani, [name], MD  amLODipine (NORVASC) 10 MG tablet Take 1 tablet (10 mg total) by mouth daily. 11/11/18  Yes Rhetta MuraSamtani, [name], MD  busPIRone (BUSPAR) 7.5 MG tablet Take 7.5 mg by mouth 2 (two) times daily. 06/16/18   Yes [provider]  clonazePAM (KLONOPIN) 0.5 MG disintegrating tablet Take 1 tablet (0.5 mg total) by mouth 2 (two) times daily as needed (anxiety). 11/11/18  Yes Rhetta MuraSamtani, [name], MD  DULoxetine (CYMBALTA) 60 MG capsule Take 60 mg by mouth daily. 07/22/17  Yes [provider]  ferrous sulfate 325 (65 FE) MG tablet Take 1 tablet (325 mg total) by mouth daily with breakfast. 09/01/16  Yes Rhetta MuraSamtani, [name], MD  Fluticasone-Umeclidin-Vilant (TRELEGY ELLIPTA) 100-62.5-25 MCG/INH AEPB Inhale 1 puff into the lungs daily. 11/11/18  Yes Rhetta MuraSamtani, [name], MD  furosemide (LASIX) 20 MG tablet Take 1 tablet (20 mg total) by mouth daily. 03/30/18  Yes Delano MetzSchertz, Robert, MD  montelukast (SINGULAIR) 10 MG tablet Take 1 tablet (10 mg total) by mouth at bedtime. 11/11/18  Yes Rhetta MuraSamtani, [name], MD  omeprazole (PRILOSEC) 20 MG capsule Take 20 mg by mouth daily.   Yes [provider]  predniSONE (DELTASONE) 10 MG tablet Take 40mg  daily for 3days,Take 30mg  daily for 3days,Take 20mg  daily for 3days,Take 10mg  daily for 3days, then stop 11/11/18  Yes Rhetta MuraSamtani, [name], MD  tiZANidine (ZANAFLEX) 2 MG tablet Take 2-3 mg by mouth every 8 (eight) hours as needed for muscle spasms. 06/16/18  Yes [provider]    Physical Exam: Vitals:   11/16/18 1745 11/16/18 1800 11/16/18 1830 11/16/18 1900  BP:  (!) 156/94 (!) 147/78 116/77  Pulse: 84 99  85  Resp: 20 (!) 21 17 18   Temp:      TempSrc:      SpO2: 90% (!) 84%  99%  Weight:      Height:       Exam limited due to hypersomnolence. Constitutional: Chronically ill cachectic woman resting in the left lateral decubitus position in bed, NAD, calm, comfortable.  Very somnolent. Eyes: PERRL, lids and conjunctivae normal ENMT: Mucous membranes are dry.  Neck: normal, supple, no masses. Respiratory: Distant breath sounds with faint wheezing bilaterally. Normal respiratory effort. No accessory muscle use.  Saturating >95% on 2 L  supplemental O2 via [place]. Cardiovascular: Regular rate and rhythm, no murmurs / rubs / gallops. No extremity edema.  Abdomen: no tenderness, no masses palpated. No hepatosplenomegaly. Bowel sounds positive.  Musculoskeletal: no clubbing / cyanosis. No joint deformity upper and lower extremities.  Moving extremities spontaneously when awakens.  She is wearing a back brace. Skin: no rashes, lesions, ulcers. No induration Neurologic: Neuro exam limited due to somnolence.  Intermittently is moving all extremities spontaneously.  Sensation appears intact. Psychiatric: Somnolent, intermittently awakens to voice and nods head yes or no.   Labs on Admission: I have personally reviewed following labs and imaging studies  CBC: Recent Labs  Lab 11/10/18 0419 11/16/18 1700  WBC 4.9 11.8*  HGB 9.8* 9.5*  HCT 36.0 35.1*  MCV 78.3* 82.8  PLT 242 275   Basic Metabolic Panel: Recent Labs  Lab 11/10/18 0419 11/16/18 1700  NA 141 141  K 4.7 4.1  CL 98 103  CO2 33* 32  GLUCOSE 108* 94  BUN 21 18  CREATININE 0.77  0.59  CALCIUM 9.6 8.5*   GFR: Estimated Creatinine Clearance: 52 mL/min (by C-G formula based on SCr of 0.59 mg/dL). Liver Function Tests: No results for input(s): AST, ALT, ALKPHOS, BILITOT, PROT, ALBUMIN in the last 168 hours. No results for input(s): LIPASE, AMYLASE in the last 168 hours. No results for input(s): AMMONIA in the last 168 hours. Coagulation Profile: No results for input(s): INR, PROTIME in the last 168 hours. Cardiac Enzymes: Recent Labs  Lab 11/16/18 1700  TROPONINI <0.03   BNP (last 3 results) No results for input(s): PROBNP in the last 8760 hours. HbA1C: No results for input(s): HGBA1C in the last 72 hours. CBG: No results for input(s): GLUCAP in the last 168 hours. Lipid Profile: No results for input(s): CHOL, HDL, LDLCALC, TRIG, CHOLHDL, LDLDIRECT in the last 72 hours. Thyroid Function Tests: No results for input(s): TSH, T4TOTAL, FREET4, T3FREE,  THYROIDAB in the last 72 hours. Anemia Panel: No results for input(s): VITAMINB12, FOLATE, FERRITIN, TIBC, IRON, RETICCTPCT in the last 72 hours. Urine analysis: No results found for: COLORURINE, APPEARANCEUR, LABSPEC, PHURINE, GLUCOSEU, HGBUR, BILIRUBINUR, KETONESUR, PROTEINUR, UROBILINOGEN, NITRITE, LEUKOCYTESUR  Radiological Exams on Admission: Dg Chest Port 1 View  Result Date: 11/16/2018 CLINICAL DATA:  Shortness of breath EXAM: PORTABLE CHEST 1 VIEW COMPARISON:  11/10/2018 FINDINGS: There are bilateral chronic bronchitic changes. The lungs are hyperinflated likely secondary to COPD. There is no focal consolidation. There is no pleural effusion or pneumothorax. There is stable cardiomegaly. The osseous structures are unremarkable. IMPRESSION: No active disease. Electronically Signed   By: Elige Ko   On: 11/16/2018 17:30    EKG: Independently reviewed.  Sinus rhythm, LVH, nonspecific ST changes inferior lateral leads not significantly changed from prior.  Assessment/Plan Principal Problem:   Altered mental status Active Problems:   COPD (chronic obstructive pulmonary disease) (HCC)   Iron deficiency anemia   Depression   Anxiety   Essential hypertension   Polysubstance abuse (HCC)   Chronic diastolic CHF (congestive heart failure) (HCC)  Vanessa Wallace is a 63 y.o. female with medical history significant for COPD with chronic hypoxic hypercarbic respiratory failure on 2-4 L supplemental O2 via [place] at home, chronic diastolic CHF (EF 16-10%), hypertension, iron deficiency anemia, anxiety and depression, and substance use disorder who is admitted with metabolic encephalopathy/hypersomnolence.   Metabolic encephalopathy with hypersomnolence: Unclear etiology, suspect adverse medication effect versus hypercarbia versus substance use.  There is no obvious infection.  SARS-CoV-2 testing is obtained and pending. -Follow-up UDS, ethanol level -Will hold her clonazepam and tizanidine for  now due to her hypersomnolence -Will give gentle IV fluids overnight as she appears dehydrated -Follow-up SARS-CoV-2, continue contact and droplet precautions for now; may discontinue if she tests negative  Chronic hypoxic hypercarbic respiratory failure End-stage COPD: She is currently saturating in the high 90s on 2 L supplemental O2 via [place].  He does not appear to be in any respiratory distress.  She has distant breath sounds with some faint wheezing bilaterally on admission. -Will give IV Solu-Medrol 40 mg once -Continue supplemental oxygen to maintain O2 sats between 88-92%, avoid over oxygenation -Continue Trelegy Ellipta, Singulair, and as needed albuterol inhaler  Chronic diastolic CHF: EF 96-04% by echocardiogram 11/10/2018.  She appears hypovolemic on exam. -Hold Lasix -Gentle IV fluids overnight -Monitor daily weights and I/O's  Hypertension: Has been normotensive to mildly hypertensive on admission. -Continue amlodipine, holding Lasix as above  Iron deficiency anemia: Hemoglobin stable at 9.5.  Continue ferrous sulfate.  Vertebral compression fractures  of thoracic and lumbar spine: Compression fractures of T2, T9, T10, and L1 seen on CT chest on 11/09/2018. -Continue Tylenol, K pad.  Limit narcotics.  Anxiety and depression: Has been taking duloxetine, buspirone, and clonazepam. -Holding clonazepam for now due to hypersomnolence on arrival -Continue duloxetine and buspirone  Substance use disorder: Patient has a history of cocaine use.  Denies drug use on admission. -Obtain UDS   DVT prophylaxis: Lovenox Code Status: DNR/DNI based on palliative care plan from December 2019 admission and reconfirmation on 11/09/2018 admission. Family Communication: No family available. Disposition Plan: Pending clinical progress. Consults called: None Admission status: Observation   Darreld Mclean MD [facility] Pager [phone]  If 7PM-7AM, please contact  night-coverage [email or web address]  11/16/2018, 8:16 PM

## 2018-11-16 NOTE — ED Notes (Signed)
ED TO INPATIENT HANDOFF REPORT  Name/Age/Gender Vanessa Wallace 63 y.o. female  Code Status    Code Status Orders  (From admission, onward)         Start     Ordered   11/16/18 1945  Do not attempt resuscitation (DNR)  Continuous    Question Answer Comment  In the event of cardiac or respiratory ARREST Do not call a "code blue"   In the event of cardiac or respiratory ARREST Do not perform Intubation, CPR, defibrillation or ACLS   In the event of cardiac or respiratory ARREST Use medication by any route, position, wound care, and other measures to relive pain and suffering. May use oxygen, suction and manual treatment of airway obstruction as needed for comfort.   Comments DNR / DNI      11/16/18 1947        Code Status History    Date Active Date Inactive Code Status Order ID Comments User Context   11/09/2018 2045 11/11/2018 1852 DNR [id number]  John Giovanni, MD ED   06/25/2018 1245 07/05/2018 1640 DNR [id number]  Trevor Iha, PA-C Inpatient   06/24/2018 1232 06/25/2018 1245 Partial Code [id number]  Duayne Cal, NP Inpatient   06/23/2018 1332 06/24/2018 1232 DNR [id number]  Trevor Iha, PA-C Inpatient   06/22/2018 0120 06/23/2018 1332 Full Code [id number]  Briscoe Deutscher, MD ED   03/29/2018 1116 03/29/2018 1949 DNR [id number]  Delano Metz, MD Inpatient   03/25/2018 2312 03/29/2018 1116 Full Code [id number]  Onnie Boer, MD Inpatient   08/08/2017 0101 08/15/2017 1923 Full Code [id number]  Lorretta Harp, MD ED   08/30/2016 1831 09/01/2016 1625 Full Code [id number]  Merlene Laughter, DO ED   10/02/2014 1741 10/03/2014 2245 Full Code [id number]  Nani Ravens, MD Inpatient   09/17/2013 1643 09/19/2013 2035 Full Code [id number]  Ky Barban, MD Inpatient    Advance Directive Documentation     Most Recent Value  Type of Advance Directive  Living will  Pre-existing out of facility DNR order (yellow form or pink MOST form)  -  "MOST" Form in Place?  -       Home/SNF/Other Home  Chief Complaint shortness of breath  Level of Care/Admitting Diagnosis ED Disposition    ED Disposition Condition Comment   Admit  [facility]ospital [facility] [[id number]]  Level of Care: Med-Surg [16]  Covid Evaluation: Person Under Investigation (PUI)  Isolation Risk Level: Low Risk/Droplet (Less than 4L [place] supplementation)  Diagnosis: Altered mental status [780.97.ICD-9-CM]  Admitting Physician: Charlsie Quest [[id number]]  Attending Physician: Charlsie Quest [[id number]]  PT Class (Do Not Modify): Observation [104]  PT Acc Code (Do Not Modify): Observation [10022]       Medical History Past Medical History:  Diagnosis Date  . Anxiety   . Arthritis    "all over" (09/17/2013)  . Asthma   . CAP (community acquired pneumonia) 09/17/2013  . Chronic bronchitis (HCC)    "flares up w/my asthma" (09/17/2013)  . Cocaine abuse (HCC)   . COPD (chronic obstructive pulmonary disease) (HCC)   . Hypertension   . Migraines    "weekly lately" (09/17/2013)    Allergies No Known Allergies  IV Location/Drains/Wounds Patient Lines/Drains/Airways Status   Active Line/Drains/Airways    Name:   Placement date:   Placement time:   Site:   Days:   Peripheral IV 11/16/18 Right Antecubital   11/16/18    1634  Antecubital   less than 1          Labs/Imaging Results for orders placed or performed during the hospital encounter of 11/16/18 (from the past 48 hour(s))  CBC     Status: Abnormal   Collection Time: 11/16/18  5:00 PM  Result Value Ref Range   WBC 11.8 (H) 4.0 - 10.5 K/uL   RBC 4.24 3.87 - 5.11 MIL/uL   Hemoglobin 9.5 (L) 12.0 - 15.0 g/dL   HCT 16.1 (L) 09.6 - 04.5 %   MCV 82.8 80.0 - 100.0 fL   MCH 22.4 (L) 26.0 - 34.0 pg   MCHC 27.1 (L) 30.0 - 36.0 g/dL   RDW 40.9 (H) 81.1 - 91.4 %   Platelets 275 150 - 400 K/uL    Comment: SPECIMEN CHECKED FOR CLOTS   nRBC 0.2 0.0 - 0.2 %    Comment: Performed at [facility],  [address] [place], [place] [address]  Basic metabolic panel     Status: Abnormal   Collection Time: 11/16/18  5:00 PM  Result Value Ref Range   Sodium 141 135 - 145 mmol/L   Potassium 4.1 3.5 - 5.1 mmol/L   Chloride 103 98 - 111 mmol/L   CO2 32 22 - 32 mmol/L   Glucose, Bld 94 70 - 99 mg/dL   BUN 18 8 - 23 mg/dL   Creatinine, Ser 6.21 0.44 - 1.00 mg/dL   Calcium 8.5 (L) 8.9 - 10.3 mg/dL   GFR calc non Af Amer >60 >60 mL/min   GFR calc Af Amer >60 >60 mL/min   Anion gap 6 5 - 15    Comment: Performed at [facility], [address] [place], [place] [address]  Troponin I - ONCE - STAT     Status: None   Collection Time: 11/16/18  5:00 PM  Result Value Ref Range   Troponin I <0.03 <0.03 ng/mL    Comment: Performed at [facility], [address] [place], [place] [address]  Brain natriuretic peptide     Status: Abnormal   Collection Time: 11/16/18  5:10 PM  Result Value Ref Range   B Natriuretic Peptide 306.3 (H) 0.0 - 100.0 pg/mL    Comment: Performed at [facility], [address], [place], [place] [address]  Blood gas, venous (at [facility])     Status: Abnormal   Collection Time: 11/16/18  6:01 PM  Result Value Ref Range   pH, Ven 7.30 7.250 - 7.430   pCO2, Ven 72.8 (HH) 44.0 - 60.0 mmHg    Comment: RBV DR Pilar Plate MD BY LISA CRADDOCK,RRT,RCP ON 11/16/2018 AT 1813    pO2, Ven 43.3 32.0 - 45.0 mmHg   Bicarbonate 34.9 (H) 20.0 - 28.0 mmol/L   Acid-Base Excess 6.5 (H) 0.0 - 2.0 mmol/L   O2 Saturation 68.2 %   Patient temperature 37.0    Collection site VEIN    Drawn by DRAWN BY RN    Sample type VEIN     Comment: Performed at [facility], [address], [place], [place] [address]  SARS Coronavirus 2 (CEPHEID- Performed in [facility] hospital lab), Hosp Order     Status: None   Collection Time: 11/16/18  6:45 PM  Result Value Ref Range   SARS Coronavirus 2 NEGATIVE NEGATIVE    Comment:  (NOTE) If result is NEGATIVE SARS-CoV-2 target nucleic acids are NOT DETECTED. The SARS-CoV-2 RNA is generally  detectable in upper and lower  respiratory specimens during the acute phase of infection. The lowest  concentration of SARS-CoV-2 viral copies this assay can detect is 250  copies / mL. A negative result does not preclude SARS-CoV-2 infection  and should not be used as the sole basis for treatment or other  patient management decisions.  A negative result may occur with  improper specimen collection / handling, submission of specimen other  than nasopharyngeal swab, presence of viral mutation(s) within the  areas targeted by this assay, and inadequate number of viral copies  (<250 copies / mL). A negative result must be combined with clinical  observations, patient history, and epidemiological information. If result is POSITIVE SARS-CoV-2 target nucleic acids are DETECTED. The SARS-CoV-2 RNA is generally detectable in upper and lower  respiratory specimens dur ing the acute phase of infection.  Positive  results are indicative of active infection with SARS-CoV-2.  Clinical  correlation with patient history and other diagnostic information is  necessary to determine patient infection status.  Positive results do  not rule out bacterial infection or co-infection with other viruses. If result is PRESUMPTIVE POSTIVE SARS-CoV-2 nucleic acids MAY BE PRESENT.   A presumptive positive result was obtained on the submitted specimen  and confirmed on repeat testing.  While 2019 novel coronavirus  (SARS-CoV-2) nucleic acids may be present in the submitted sample  additional confirmatory testing may be necessary for epidemiological  and / or clinical management purposes  to differentiate between  SARS-CoV-2 and other Sarbecovirus currently known to infect humans.  If clinically indicated additional testing with an alternate test  methodology [phone]) is advised. The SARS-CoV-2 RNA is  generally  detectable in upper and lower respiratory sp ecimens during the acute  phase of infection. The expected result is Negative. Fact Sheet for Patients:  BoilerBrush.com.cy Fact Sheet for Healthcare Providers: https://pope.com/ This test is not yet approved or cleared by the [place] FDA and has been authorized for detection and/or diagnosis of SARS-CoV-2 by FDA under an Emergency Use Authorization (EUA).  This EUA will remain in effect (meaning this test can be used) for the duration of the COVID-19 declaration under Section 564(b)(1) of the Act, 21 U.S.C. section 360bbb-3(b)(1), unless the authorization is terminated or revoked sooner. Performed at [facility], [address], [place], [place] [address]    Dg Chest Port 1 View  Result Date: 11/16/2018 CLINICAL DATA:  Shortness of breath EXAM: PORTABLE CHEST 1 VIEW COMPARISON:  11/10/2018 FINDINGS: There are bilateral chronic bronchitic changes. The lungs are hyperinflated likely secondary to COPD. There is no focal consolidation. There is no pleural effusion or pneumothorax. There is stable cardiomegaly. The osseous structures are unremarkable. IMPRESSION: No active disease. Electronically Signed   By: Elige Ko   On: 11/16/2018 17:30    Pending Labs Unresulted Labs (From admission, onward)    Start     Ordered   11/17/18 0500  Basic metabolic panel  Tomorrow morning,   R     11/16/18 1947   11/17/18 0500  CBC  Tomorrow morning,   R     11/16/18 1947   11/16/18 1951  Ethanol  Add-on,   R     11/16/18 1950   11/16/18 1701  Rapid urine drug screen (hospital performed)  ONCE - STAT,   R     11/16/18 1700   11/16/18 1701  Urinalysis, Routine w reflex microscopic  ONCE - STAT,   R  11/16/18 1700          Vitals/Pain Today's Vitals   11/16/18 1745 11/16/18 1800 11/16/18 1830 11/16/18 1900  BP:  (!) 156/94 (!) 147/78 116/77  Pulse: 84 99  85   Resp: 20 (!) 21 17 18   Temp:      TempSrc:      SpO2: 90% (!) 84%  99%  Weight:      Height:        Isolation Precautions No active isolations  Medications Medications  ipratropium-albuterol (DUONEB) 0.5-2.5 (3) MG/3ML nebulizer solution 3 mL (has no administration in time range)  enoxaparin (LOVENOX) injection 40 mg (has no administration in time range)  lactated ringers infusion (has no administration in time range)  acetaminophen (TYLENOL) tablet 650 mg (has no administration in time range)    Or  acetaminophen (TYLENOL) suppository 650 mg (has no administration in time range)  ondansetron (ZOFRAN) tablet 4 mg (has no administration in time range)    Or  ondansetron (ZOFRAN) injection 4 mg (has no administration in time range)  albuterol (VENTOLIN HFA) 108 (90 Base) MCG/ACT inhaler 1-2 puff (has no administration in time range)  amLODipine (NORVASC) tablet 10 mg (has no administration in time range)  busPIRone (BUSPAR) tablet 7.5 mg (has no administration in time range)  DULoxetine (CYMBALTA) DR capsule 60 mg (has no administration in time range)  ferrous sulfate tablet 325 mg (has no administration in time range)  Fluticasone-Umeclidin-Vilant 100-62.5-25 MCG/INH AEPB 1 puff (has no administration in time range)  montelukast (SINGULAIR) tablet 10 mg (has no administration in time range)  pantoprazole (PROTONIX) EC tablet 40 mg (has no administration in time range)  methylPREDNISolone sodium succinate (SOLU-MEDROL) 40 mg/mL injection 40 mg (has no administration in time range)  predniSONE (DELTASONE) tablet 60 mg (60 mg Oral Given 11/16/18 1750)  albuterol (VENTOLIN HFA) 108 (90 Base) MCG/ACT inhaler 2 puff (2 puffs Inhalation Given 11/16/18 1750)    Mobility walks with person assist

## 2018-11-16 NOTE — ED Provider Notes (Signed)
[facility] Provider Note MRN:  [id number]  Arrival date & time: 11/16/18     Chief Complaint   Shortness of Breath (COPD/wheezing)   History of Present Illness   Vanessa Wallace is a 63 y.o. year-old female with a history of CHF, COPD, polysubstance abuse presenting to the ED with chief complaint of shortness of breath.  Patient is somnolent and unable to provide a full history.  States that she is here because she was moving her mattress and hurt her lower back yesterday.  Denies numbness or weakness to the arms or legs, denies bowel or bladder dysfunction.  States that her breathing is at baseline currently.  Denies chest pain, no abdominal pain, no fever, no cough.  I was unable to obtain an accurate HPI, PMH, or ROS due to the patient's altered mental status.  Review of Systems  A complete 10 system review of systems was obtained and all systems are negative except as noted in the HPI and PMH.   Patient's Health History    Past Medical History:  Diagnosis Date   Anxiety    Arthritis    "all over" (09/17/2013)   Asthma    CAP (community acquired pneumonia) 09/17/2013   Chronic bronchitis (HCC)    "flares up w/my asthma" (09/17/2013)   Cocaine abuse (HCC)    COPD (chronic obstructive pulmonary disease) (HCC)    Hypertension    Migraines    "weekly lately" (09/17/2013)    Past Surgical History:  Procedure Laterality Date   ABDOMINAL HYSTERECTOMY     "partial"   BUNIONECTOMY Bilateral    "right one came back after OR" (09/17/2013)   LACERATION REPAIR Left 06/1999   Repair FPL, radial digital nerve, intrinsics, left thumb./notes 06/29/1999 (09/17/2013)   TUBAL LIGATION      Family History  Problem Relation Age of Onset   Brain cancer Mother     Social History   Socioeconomic History   Marital status: Single    Spouse name: Not on file   Number of children: Not on file   Years of education: Not on file   Highest  education level: Not on file  Occupational History   Not on file  Social Needs   Financial resource strain: Not hard at all   Food insecurity:    Worry: Patient refused    Inability: Patient refused   Transportation needs:    Medical: Patient refused    Non-medical: Patient refused  Tobacco Use   Smoking status: Former Smoker    Packs/day: 0.30    Years: 40.00    Pack years: 12.00    Types: Cigarettes    Last attempt to quit: 08/11/2018    Years since quitting: 0.2   Smokeless tobacco: Never Used   Tobacco comment: 09/17/2013 "weaned down from 2 ppd of cigarettes"  Substance and Sexual Activity   Alcohol use: No    Alcohol/week: 6.0 standard drinks    Types: 6 Cans of beer per week    Comment: Quit 2 weeks   Drug use: Yes    Types: Marijuana, Cocaine   Sexual activity: Yes  Lifestyle   Physical activity:    Days per week: Patient refused    Minutes per session: Patient refused   Stress: Not at all  Relationships   Social connections:    Talks on phone: Patient refused    Gets together: Patient refused    Attends religious service: Patient refused  Active member of club or organization: Patient refused    Attends meetings of clubs or organizations: Patient refused    Relationship status: Patient refused   Intimate partner violence:    Fear of current or ex partner: Patient refused    Emotionally abused: Patient refused    Physically abused: Patient refused    Forced sexual activity: Patient refused  Other Topics Concern   Not on file  Social History Narrative   Not on file     Physical Exam  Vital Signs and Nursing Notes reviewed Vitals:   11/16/18 1830 11/16/18 1900  BP: (!) 147/78 116/77  Pulse:  85  Resp: 17 18  Temp:    SpO2:  99%    CONSTITUTIONAL: Chronically ill-appearing, NAD NEURO: Somnolent, wakes to loud voice, moving all extremities EYES:  eyes equal and reactive ENT/NECK:  no LAD, no JVD CARDIO: Regular rate, well-perfused,  normal S1 and S2 PULM: Poor air movement, scattered wheezes GI/GU:  normal bowel sounds, non-distended, non-tender MSK/SPINE:  No gross deformities, no edema SKIN:  no rash, atraumatic PSYCH:  Appropriate speech and behavior  Diagnostic and Interventional Summary    EKG Interpretation  Date/Time:  Sunday Nov 16 2018 18:11:01 EDT Ventricular Rate:  90 PR Interval:    QRS Duration: 76 QT Interval:  367 QTC Calculation: 449 R Axis:   78 Text Interpretation:  Sinus rhythm Borderline short PR interval Consider right atrial enlargement Left ventricular hypertrophy Anterior Q waves, possibly due to LVH Nonspecific T abnormalities, inferior leads Confirmed by Kennis Carina [phone]) on 11/16/2018 7:17:36 PM      Labs Reviewed  CBC - Abnormal; Notable for the following components:      Result Value   WBC 11.8 (*)    Hemoglobin 9.5 (*)    HCT 35.1 (*)    MCH 22.4 (*)    MCHC 27.1 (*)    RDW 24.0 (*)    All other components within normal limits  BASIC METABOLIC PANEL - Abnormal; Notable for the following components:   Calcium 8.5 (*)    All other components within normal limits  BLOOD GAS, VENOUS - Abnormal; Notable for the following components:   pCO2, Ven 72.8 (*)    Bicarbonate 34.9 (*)    Acid-Base Excess 6.5 (*)    All other components within normal limits  BRAIN NATRIURETIC PEPTIDE - Abnormal; Notable for the following components:   B Natriuretic Peptide 306.3 (*)    All other components within normal limits  SARS CORONAVIRUS 2 (HOSPITAL ORDER, PERFORMED IN [facility])  TROPONIN I  RAPID URINE DRUG SCREEN, HOSP PERFORMED  URINALYSIS, ROUTINE W REFLEX MICROSCOPIC    DG Chest Port 1 View  Final Result      Medications  ipratropium-albuterol (DUONEB) 0.5-2.5 (3) MG/3ML nebulizer solution 3 mL (has no administration in time range)  predniSONE (DELTASONE) tablet 60 mg (60 mg Oral Given 11/16/18 1750)  albuterol (VENTOLIN HFA) 108 (90 Base) MCG/ACT inhaler 2 puff  (2 puffs Inhalation Given 11/16/18 1750)     Procedures Critical Care Critical Care Documentation Critical care time provided by me (excluding procedures): 33 minutes  Condition necessitating critical care: hypercarbic respiratory failure, acute on chronic  Components of critical care management: reviewing of prior records, laboratory and imaging interpretation, frequent re-examination and reassessment of vital signs, administration of steroids, albuterol, discussion with consulting services    ED Course and Medical Decision Making  I have reviewed the triage vital signs and the nursing  notes.  Pertinent labs & imaging results that were available during my care of the patient were reviewed by me and considered in my medical decision making (see below for details).  Patient seems to be intoxicated, though she denies drugs or alcohol.  Also considering hypercarbia given her history of COPD and CHF, poor air movement, wheezing on exam.  Patient has normal O2 saturations, normal vital signs, discussed with nurse, patient will be continuously monitored, awaiting VBG and other laboratory assessment.  When patient wakes to voice she is moving all extremities, follow commands, doubt CNS etiology of patient's altered mental status at this time.  Labs reveal likely acute on chronic hypercarbic respiratory failure, will retest for coronavirus, tested negative on 4-26.  Admitted to hospitalist service for further care.  Elmer Sow. Pilar Plate, MD [facility] Emergency Medicine [facility] Health [email or web address]  Final Clinical Impressions(s) / ED Diagnoses     ICD-10-CM   1. Acute on chronic respiratory failure with hypercapnia (HCC) J96.22   2. SOB (shortness of breath) R06.02 DG Chest [facility] 1 View    DG Chest [place] 1 View  3. COPD exacerbation [facility]) J44.1     ED Discharge Orders    None         Sabas Sous, MD 11/16/18 [phone]

## 2018-11-16 NOTE — ED Notes (Signed)
Admitting MD paged regarding bipap orders and possible bed request change

## 2018-11-16 NOTE — Progress Notes (Signed)
Pt's home medications verified with Sarajane Jews, RN. Medications stored in pharmacy following hospital policy.

## 2018-11-17 DIAGNOSIS — F329 Major depressive disorder, single episode, unspecified: Secondary | ICD-10-CM

## 2018-11-17 DIAGNOSIS — I5032 Chronic diastolic (congestive) heart failure: Secondary | ICD-10-CM

## 2018-11-17 DIAGNOSIS — M545 Low back pain: Secondary | ICD-10-CM

## 2018-11-17 DIAGNOSIS — R400 Somnolence: Secondary | ICD-10-CM | POA: Diagnosis not present

## 2018-11-17 DIAGNOSIS — F1910 Other psychoactive substance abuse, uncomplicated: Secondary | ICD-10-CM

## 2018-11-17 DIAGNOSIS — J439 Emphysema, unspecified: Secondary | ICD-10-CM | POA: Diagnosis not present

## 2018-11-17 DIAGNOSIS — M549 Dorsalgia, unspecified: Secondary | ICD-10-CM | POA: Diagnosis present

## 2018-11-17 DIAGNOSIS — G4710 Hypersomnia, unspecified: Secondary | ICD-10-CM | POA: Diagnosis present

## 2018-11-17 LAB — CBC
HCT: 37.3 % (ref 36.0–46.0)
Hemoglobin: 9.9 g/dL — ABNORMAL LOW (ref 12.0–15.0)
MCH: 21.9 pg — ABNORMAL LOW (ref 26.0–34.0)
MCHC: 26.5 g/dL — ABNORMAL LOW (ref 30.0–36.0)
MCV: 82.3 fL (ref 80.0–100.0)
Platelets: 318 10*3/uL (ref 150–400)
RBC: 4.53 MIL/uL (ref 3.87–5.11)
RDW: 23.9 % — ABNORMAL HIGH (ref 11.5–15.5)
WBC: 10.2 10*3/uL (ref 4.0–10.5)
nRBC: 0 % (ref 0.0–0.2)

## 2018-11-17 LAB — BASIC METABOLIC PANEL
Anion gap: 9 (ref 5–15)
BUN: 18 mg/dL (ref 8–23)
CO2: 31 mmol/L (ref 22–32)
Calcium: 8.9 mg/dL (ref 8.9–10.3)
Chloride: 101 mmol/L (ref 98–111)
Creatinine, Ser: 0.56 mg/dL (ref 0.44–1.00)
GFR calc Af Amer: >60 mL/min (ref >60)
GFR calc non Af Amer: >60 mL/min (ref >60)
Glucose, Bld: 123 mg/dL — ABNORMAL HIGH (ref 70–99)
Potassium: 4.9 mmol/L (ref 3.5–5.1)
Sodium: 141 mmol/L (ref 135–145)

## 2018-11-17 LAB — ETHANOL: Alcohol, Ethyl (B): <10 mg/dL (ref <10)

## 2018-11-17 MED ORDER — HYDRALAZINE HCL 20 MG/ML IJ SOLN: 5.0000 mg | INTRAMUSCULAR | Status: DC | PRN

## 2018-11-17 NOTE — Discharge Summary (Signed)
Discharge Summary  Vanessa Wallace:[id number] DOB: Feb 09, 1956  PCP: Regino Bellow, MD  Admit date: 11/16/2018 Discharge date: 11/17/2018   Time spent: < 25 minutes  Admitted From: Home Disposition:  home  Recommendations for Outpatient Follow-up:  1. Follow up with PCP in 1 week 2. NO script for ativan provided given hypersomnolence after taking too much for back pain and cocaine positive on UDS     Discharge Diagnoses:  Active Hospital Problems   Diagnosis Date Noted   Altered mental status 11/16/2018   Chronic diastolic CHF (congestive heart failure) (HCC) 11/16/2018   Anxiety 06/22/2018   Polysubstance abuse (HCC)    Essential hypertension    Iron deficiency anemia 08/08/2017   Depression 08/08/2017   COPD (chronic obstructive pulmonary disease) (HCC) 08/30/2016    Resolved Hospital Problems  No resolved problems to display.    Discharge Condition: Stable   CODE STATUS:DNR   History of present illness:  Vanessa Wallace is a 64 y.o. year old female with medical history significant for with chronic hypoxic hypercarbic respiratory failure on 2-4 L supplemental O2 via [place] at home, chronic diastolic CHF (EF 94-70%), hypertension, iron deficiency anemia, anxiety and depression,n vertebral compression fractures of thoracic and lumbar spine, and substance use who presented on 11/16/2018 with report of back pain after trying to lift a mattress on her own.  Her history was initially limited on admission as this patient was very somnolent.    Arrived to the ED with complaints of shortness of breath In the ED T-max 97.3, respiratory rate 22, oxygen desaturation 84% requiring 4 L nasal cannula, tachycardic to 102, blood pressure 136/81. Labwork significant for ethanol less than 10, UDS positive for cocaine.  COVID-19 testing was obtained and negative. UA unremarkable. VBG obtained shows CO2 of 72.8. BNP elevated at 306 WBC of 11.8, hemoglobin 9.5 BMP  unremarkable Troponin 0.  Chest x-ray showed no active disease. Patient was given prednisone 60 mg in the ED and albuterol before [facility] for further management of presumed metabolic encephalopathy with hypersomnolence.   Remaining hospital course addressed in problem based format below:   Hospital Course:   Metabolic encephalopathy with hypersomnolence On my examination on discharge day patient admitted that after straining her back she took more than her appropriate dose of Ativan (4 pills) to deal with the pain.  She admits to having frequent panic/anxiety attacks for which the Ativan is prescribed.  Given patient was recently discharged on 4/28 for COPD exacerbation and provided short course Ativan prescription at that time, I explained I will not be sending her out with a new prescription.  I also advised patient to use her Ativan medication appropriately for anxiety only and not to treat her back pain.   Back pain, suspect muscular strain in a patient with known compression fracture history Patient hurt her back while trying to lift a mattress on her own.  On examination she had reproducible paraspinal tenderness in the lumbar region.  Otherwise she was neurologically intact with no bowel/urine incontinence no spinal tenderness.  Advised patient to manage pain with warm compresses.  No prescription were provided given patient came hypersomnolent after taking too much of her Ativan. Advised to limit narcotics  Chronic hypoxic hypercarbic respiratory failure secondary to severe COPD, stable Patient was able to ambulate with nursing without desaturation.  She maintained her normal O2 requirements of 2-4 L nasal cannula throughout hospital stay. No worsening wheeze or change of cough, her  COPD remained stable  Anxiety and depression, stable Not currently having any worsening anxiety.  Advised patient to follow-up with her primary doctor prescribes.  We will continue  duloxetine, BuSpar, clonazepam was held during hospital stay given presentation.  Polysubstance abuse  UDS was positive for cocaine.  Likely contributing to patient's presentation  Remaining chronic problems were unchanged  Consultations:  None  Procedures/Studies: None  Discharge Exam: BP 124/68 (BP Location: Right Arm)    Pulse 78    Temp 98.6 F (37 C) (Oral)    Resp 20    Ht 5\' 2"  (1.575 m)    Wt 45.6 kg    SpO2 94%    BMI 18.39 kg/m   General: Elderly female, cachectic, lying in bed, no apparent distress Eyes: EOMI, anicteric ENT: Oral Mucosa clear and moist Cardiovascular: regular rate and rhythm, no murmurs, rubs or gallops, no edema, Respiratory: Normal respiratory effort on 2 L, diminished breath sounds otherwise clear Abdomen: soft, non-distended, non-tender, normal bowel sounds Skin: No Rash Neurologic: 5 out of 5 strength in both upper and lower extremities, able to follow commands, no gross focal deficits appreciated.Mental status AAOx3, speech normal, Psychiatric:Appropriate affect, and mood   Discharge Instructions You were cared for by a hospitalist during your hospital stay. If you have any questions about your discharge medications or the care you received while you were in the hospital after you are discharged, you can call the unit and asked to speak with the hospitalist on call if the hospitalist that took care of you is not available. Once you are discharged, your primary care physician will handle any further medical issues. Please note that NO REFILLS for any discharge medications will be authorized once you are discharged, as it is imperative that you return to your primary care physician (or establish a relationship with a primary care physician if you do not have one) for your aftercare needs so that they can reassess your need for medications and monitor your lab values.  Discharge Instructions    Diet - low sodium heart healthy   Complete by:  As  directed    Increase activity slowly   Complete by:  As directed      Allergies as of 11/17/2018   No Known Allergies     Medication List    TAKE these medications   albuterol 108 (90 Base) MCG/ACT inhaler Commonly known as:  VENTOLIN HFA Inhale 1-2 puffs into the lungs every 6 (six) hours as needed for wheezing or shortness of breath.   amLODipine 10 MG tablet Commonly known as:  NORVASC Take 1 tablet (10 mg total) by mouth daily.   busPIRone 7.5 MG tablet Commonly known as:  BUSPAR Take 7.5 mg by mouth 2 (two) times daily.   clonazePAM 0.5 MG disintegrating tablet Commonly known as:  KLONOPIN Take 1 tablet (0.5 mg total) by mouth 2 (two) times daily as needed (anxiety).   DULoxetine 60 MG capsule Commonly known as:  CYMBALTA Take 60 mg by mouth daily.   ferrous sulfate 325 (65 FE) MG tablet Take 1 tablet (325 mg total) by mouth daily with breakfast.   Fluticasone-Umeclidin-Vilant 100-62.5-25 MCG/INH Aepb Commonly known as:  Trelegy Ellipta Inhale 1 puff into the lungs daily.   furosemide 20 MG tablet Commonly known as:  LASIX Take 1 tablet (20 mg total) by mouth daily.   montelukast 10 MG tablet Commonly known as:  SINGULAIR Take 1 tablet (10 mg total) by mouth at bedtime.  omeprazole 20 MG capsule Commonly known as:  PRILOSEC Take 20 mg by mouth daily.   predniSONE 10 MG tablet Commonly known as:  DELTASONE Take  daily for 3days,Take  daily for 3days,Take  daily for 3days,Take  daily for 3days, then stop   tiZANidine 2 MG tablet Commonly known as:  ZANAFLEX Take 2-3 mg by mouth every 8 (eight) hours as needed for muscle spasms.      No Known Allergies    The results of significant diagnostics from this hospitalization (including imaging, microbiology, ancillary and laboratory) are listed below for reference.    Significant Diagnostic Studies: Dg Chest 2 View  Result Date: 11/10/2018 CLINICAL DATA:  Chronic bronchitis and asthma.  EXAM: CHEST - 2 VIEW COMPARISON:  11/09/2018.  06/26/2018 FINDINGS: Today's examination appears baseline, with mild cardiomegaly and aortic atherosclerosis. Chronic interstitial lung markings are present consistent with chronic lung disease/emphysema. The pronounced enlargement of the heart suggested on yesterday's chest radiograph is not shown today. No evidence of effusion or collapse. IMPRESSION: Baseline appearance with mild cardiomegaly and aortic atherosclerosis and chronic interstitial lung disease/emphysema. Electronically Signed   By: Paulina Fusi M.D.   On: 11/10/2018 20:19   Ct Chest Wo Contrast  Result Date: 11/09/2018 CLINICAL DATA:  63 year old female with shortness of breath. Pending COVID-19 test. EXAM: CT CHEST WITHOUT CONTRAST TECHNIQUE: Multidetector CT imaging of the chest was performed following the standard protocol without IV contrast. COMPARISON:  AP chest radiograph earlier today. Chest CT 03/26/2018. FINDINGS: Cardiovascular: Calcified aortic and coronary artery atherosclerosis. Vascular patency is not evaluated in the absence of IV contrast. Stable mild cardiomegaly. No pericardial effusion. Mediastinum/Nodes: Negative noncontrast mediastinum. Lungs/Pleura: Chronic centrilobular emphysema. Stable lung volumes. Major airways are patent. Scattered chronic subpleural scarring, most pronounced in the anterior upper lobes and posterior left lower lobe. No pleural effusion. Chronic scarring in the medial segment of the right middle lobe. No acute ground-glass or other pulmonary opacity. Upper Abdomen: Negative visible noncontrast liver, spleen, pancreas. The left renal collecting system may be duplicated. Renal collecting systems are partially visible but appear larger than on the prior. Musculoskeletal: Widespread thoracic and upper lumbar compression fractures. T3 and T6 through T8 compression fractures are stable. There are new generally mild compression fractures at T2, T9, T10, and L1  since September. Osteopenia. Ribs and sternum appear stable. IMPRESSION: 1. No acute pulmonary abnormality. Chronic lung disease with Emphysema (ICD10-J43.9). 2. Osteopenia with new superior endplate compression fractures T2, T9, T10, and L1 since September. 3. Possible new renal hydronephrosis. Is there acute renal insufficiency? 4. Calcified coronary artery and Aortic atherosclerosis (ICD10-I70.0). Electronically Signed   By: Odessa Fleming M.D.   On: 11/09/2018 22:02   US Renal  Result Date: 11/10/2018 CLINICAL DATA:  63 year old female with possible new hydronephrosis on noncontrast chest CT tonight. EXAM: RENAL / URINARY TRACT ULTRASOUND COMPLETE COMPARISON:  Chest CT 11/09/2018. FINDINGS: Right Kidney: Renal measurements: 10.4 x 4.5 x 6.2 centimeters = volume: 152 mL . Echogenicity within normal limits. No mass or hydronephrosis visualized. Left Kidney: Renal measurements: 9.2 x 5.7 x 5.0 centimeters = volume: 135 mL. Echogenicity within normal limits. No mass or hydronephrosis visualized. Bladder: No definite abnormality. IMPRESSION: Negative for hydronephrosis. Normal ultrasound appearance of both kidneys. Electronically Signed   By: Odessa Fleming M.D.   On: 11/10/2018 01:23   Dg Chest Port 1 View  Result Date: 11/16/2018 CLINICAL DATA:  Shortness of breath EXAM: PORTABLE CHEST 1 VIEW COMPARISON:  11/10/2018 FINDINGS: There are bilateral chronic  bronchitic changes. The lungs are hyperinflated likely secondary to COPD. There is no focal consolidation. There is no pleural effusion or pneumothorax. There is stable cardiomegaly. The osseous structures are unremarkable. IMPRESSION: No active disease. Electronically Signed   By: Elige Ko   On: 11/16/2018 17:30   Dg Chest Portable 1 View  Result Date: 11/09/2018 CLINICAL DATA:  Shortness of breath. History of COPD, lung cancer and home oxygen. EXAM: PORTABLE CHEST 1 VIEW COMPARISON:  Radiographs 06/26/2018 and 06/24/2018.  CT 03/26/2018. FINDINGS: 1734 hours. The  cardiac silhouette appears larger than on the most recent study, although this may be accentuated by lordotic positioning currently. The mediastinal contours are stable. The lungs are hyperinflated but clear. There is stable pleural thickening at both apices. No pneumothorax or significant pleural effusion. Telemetry leads overlie the chest. IMPRESSION: 1. Possible mild increase in the heart size, suggesting cardiomegaly or pericardial effusion. This difference could be technical based on lordotic positioning. 2. Chronic obstructive disease without acute pulmonary findings. Electronically Signed   By: Carey Bullocks M.D.   On: 11/09/2018 18:50    Microbiology: Recent Results (from the past 240 hour(s))  SARS Coronavirus 2 [facility] order, Performed in [facility] hospital lab)     Status: None   Collection Time: 11/09/18  8:40 PM  Result Value Ref Range Status   SARS Coronavirus 2 NEGATIVE NEGATIVE Final    Comment: (NOTE) If result is NEGATIVE SARS-CoV-2 target nucleic acids are NOT DETECTED. The SARS-CoV-2 RNA is generally detectable in upper and lower  respiratory specimens during the acute phase of infection. The lowest  concentration of SARS-CoV-2 viral copies this assay can detect is 250  copies / mL. A negative result does not preclude SARS-CoV-2 infection  and should not be used as the sole basis for treatment or other  patient management decisions.  A negative result may occur with  improper specimen collection / handling, submission of specimen other  than nasopharyngeal swab, presence of viral mutation(s) within the  areas targeted by this assay, and inadequate number of viral copies  (<250 copies / mL). A negative result must be combined with clinical  observations, patient history, and epidemiological information. If result is POSITIVE SARS-CoV-2 target nucleic acids are DETECTED. The SARS-CoV-2 RNA is generally detectable in upper and lower  respiratory specimens dur ing the  acute phase of infection.  Positive  results are indicative of active infection with SARS-CoV-2.  Clinical  correlation with patient history and other diagnostic information is  necessary to determine patient infection status.  Positive results do  not rule out bacterial infection or co-infection with other viruses. If result is PRESUMPTIVE POSTIVE SARS-CoV-2 nucleic acids MAY BE PRESENT.   A presumptive positive result was obtained on the submitted specimen  and confirmed on repeat testing.  While 2019 novel coronavirus  (SARS-CoV-2) nucleic acids may be present in the submitted sample  additional confirmatory testing may be necessary for epidemiological  and / or clinical management purposes  to differentiate between  SARS-CoV-2 and other Sarbecovirus currently known to infect humans.  If clinically indicated additional testing with an alternate test  methodology [phone] is advised. The SARS-CoV-2 RNA is generally  detectable in upper and lower respiratory sp ecimens during the acute  phase of infection. The expected result is Negative. Fact Sheet for Patients:  BoilerBrush.com.cy Fact Sheet for Healthcare Providers: https://pope.com/ This test is not yet approved or cleared by the [place] FDA and has been authorized for detection and/or diagnosis  of SARS-CoV-2 by FDA under an Emergency Use Authorization (EUA).  This EUA will remain in effect (meaning this test can be used) for the duration of the COVID-19 declaration under Section 564(b)(1) of the Act, 21 U.S.C. section 360bbb-3(b)(1), unless the authorization is terminated or revoked sooner. Performed at [facility], [address] [place], [place] [address]   MRSA PCR Screening     Status: Abnormal   Collection Time: 11/10/18  6:20 AM  Result Value Ref Range Status   MRSA by PCR POSITIVE (A) NEGATIVE Final    Comment:        The GeneXpert MRSA Assay  (FDA approved for NASAL specimens only), is one component of a comprehensive MRSA colonization surveillance program. It is not intended to diagnose MRSA infection nor to guide or monitor treatment for MRSA infections. RESULT CALLED TO, READ BACK BY AND VERIFIED WITH: MELTON RN AT [phone] ON 11/10/2018 BY S.VANHOORNE Performed at [facility], [address], [place], [place] [address]   SARS Coronavirus 2 (CEPHEID- Performed in [facility] hospital lab), Hosp Order     Status: None   Collection Time: 11/16/18  6:45 PM  Result Value Ref Range Status   SARS Coronavirus 2 NEGATIVE NEGATIVE Final    Comment: (NOTE) If result is NEGATIVE SARS-CoV-2 target nucleic acids are NOT DETECTED. The SARS-CoV-2 RNA is generally detectable in upper and lower  respiratory specimens during the acute phase of infection. The lowest  concentration of SARS-CoV-2 viral copies this assay can detect is 250  copies / mL. A negative result does not preclude SARS-CoV-2 infection  and should not be used as the sole basis for treatment or other  patient management decisions.  A negative result may occur with  improper specimen collection / handling, submission of specimen other  than nasopharyngeal swab, presence of viral mutation(s) within the  areas targeted by this assay, and inadequate number of viral copies  (<250 copies / mL). A negative result must be combined with clinical  observations, patient history, and epidemiological information. If result is POSITIVE SARS-CoV-2 target nucleic acids are DETECTED. The SARS-CoV-2 RNA is generally detectable in upper and lower  respiratory specimens dur ing the acute phase of infection.  Positive  results are indicative of active infection with SARS-CoV-2.  Clinical  correlation with patient history and other diagnostic information is  necessary to determine patient infection status.  Positive results do  not rule out bacterial infection or  co-infection with other viruses. If result is PRESUMPTIVE POSTIVE SARS-CoV-2 nucleic acids MAY BE PRESENT.   A presumptive positive result was obtained on the submitted specimen  and confirmed on repeat testing.  While 2019 novel coronavirus  (SARS-CoV-2) nucleic acids may be present in the submitted sample  additional confirmatory testing may be necessary for epidemiological  and / or clinical management purposes  to differentiate between  SARS-CoV-2 and other Sarbecovirus currently known to infect humans.  If clinically indicated additional testing with an alternate test  methodology [phone]) is advised. The SARS-CoV-2 RNA is generally  detectable in upper and lower respiratory sp ecimens during the acute  phase of infection. The expected result is Negative. Fact Sheet for Patients:  BoilerBrush.com.cy Fact Sheet for Healthcare Providers: https://pope.com/ This test is not yet approved or cleared by the [place] FDA and has been authorized for detection and/or diagnosis of SARS-CoV-2 by FDA under an Emergency Use Authorization (EUA).  This EUA will remain in effect (meaning this test can  be used) for the duration of the COVID-19 declaration under Section 564(b)(1) of the Act, 21 U.S.C. section 360bbb-3(b)(1), unless the authorization is terminated or revoked sooner. Performed at [facility], [address] [place], [place] [address]      Labs: Basic Metabolic Panel: Recent Labs  Lab 11/16/18 1700 11/17/18 0435  NA 141 141  K 4.1 4.9  CL 103 101  CO2 32 31  GLUCOSE 94 123*  BUN 18 18  CREATININE 0.59 0.56  CALCIUM 8.5* 8.9   Liver Function Tests: No results for input(s): AST, ALT, ALKPHOS, BILITOT, PROT, ALBUMIN in the last 168 hours. No results for input(s): LIPASE, AMYLASE in the last 168 hours. No results for input(s): AMMONIA in the last 168 hours. CBC: Recent Labs  Lab 11/16/18 1700  11/17/18 0435  WBC 11.8* 10.2  HGB 9.5* 9.9*  HCT 35.1* 37.3  MCV 82.8 82.3  PLT 275 318   Cardiac Enzymes: Recent Labs  Lab 11/16/18 1700  TROPONINI <0.03   BNP: BNP (last 3 results) Recent Labs    06/21/18 2339 11/09/18 1745 11/16/18 1710  BNP 205.6* 589.6* 306.3*    ProBNP (last 3 results) No results for input(s): PROBNP in the last 8760 hours.  CBG: No results for input(s): GLUCAP in the last 168 hours.     Signed:  Laverna Peace, MD [facility] 11/17/2018, 11:50 AM

## 2018-11-17 NOTE — Progress Notes (Signed)
At discharge pts son thought oxygen tank maybe empty. RN assessed. Pt & son were not agreeable to wait for son to return with a full oxygen tank. Education provided on the risks. Pt & son verbalized understanding and stated "we just live down the street"

## 2018-11-17 NOTE — Progress Notes (Addendum)
No change from am assessment. Discharge instructions reviewed, questions concerns denied. Education provided to pts son also. Oxygen tank requested for discharge.  Pt home medications returned.

## 2018-11-24 ENCOUNTER — Ambulatory Visit: Payer: Medicaid Other | Admitting: Pulmonary Disease

## 2019-01-26 ENCOUNTER — Ambulatory Visit: Payer: Medicaid Other | Admitting: Nurse Practitioner

## 2019-01-26 NOTE — Progress Notes (Deleted)
@Patient  ID: Vanessa Wallace, female    DOB: July 22, 1955, 64 y.o.   MRN: [id number]  No chief complaint on file.   Referring provider: Kateri Mc, MD  HPI  63 year old female with COPD, asthma, cocaine user, CHF who is followed by Dr. Vaughan Browner.  Tests:  Imaging: CXR 11/16/18 - No active disease CT chest 03/26/2018- no PE, soft tissue density at the apices.  Advanced emphysema, cardiomegaly with coronary artery calcification.  T8 compression fracture.  Prominent bilateral hilar lymph nodes. I reviewed the images personally.  PFTs: Spirometry 08/25/2018 FVC 1.3 [50%], FEV1 0.5 [27%), F/F 40 Very severe obstruction.  Labs: CBC 03/25/2018-WBC 5.8, eos 1%, absolute eosinophil count 58  Cardiac: Mild LVH, LVEF 50-55%, PA peak pressure of 67, grade 1 diastolic dysfunction.  OV 01/26/19 - follow up      No Known Allergies  There is no immunization history for the selected administration types on file for this patient.  Past Medical History:  Diagnosis Date  . Anxiety   . Arthritis    "all over" (09/17/2013)  . Asthma   . CAP (community acquired pneumonia) 09/17/2013  . Chronic bronchitis ([place])    "flares up w/my asthma" (09/17/2013)  . Cocaine abuse ([place])   . COPD (chronic obstructive pulmonary disease) ([place])   . Hypertension   . Migraines    "weekly lately" (09/17/2013)    Tobacco History: Social History   Tobacco Use  Smoking Status Former Smoker  . Packs/day: 0.30  . Years: 40.00  . Pack years: 12.00  . Types: Cigarettes  . Quit date: 08/11/2018  . Years since quitting: 0.4  Smokeless Tobacco Never Used  Tobacco Comment   09/17/2013 "weaned down from 2 ppd of cigarettes"   Counseling given: Not Answered Comment: 09/17/2013 "weaned down from 2 ppd of cigarettes"   Outpatient Encounter Medications as of 01/26/2019  Medication Sig  . albuterol (VENTOLIN HFA) 108 (90 Base) MCG/ACT inhaler Inhale 1-2 puffs into the lungs every 6 (six) hours as needed for wheezing  or shortness of breath.  Marland Kitchen amLODipine (NORVASC) 10 MG tablet Take 1 tablet (10 mg total) by mouth daily.  . busPIRone (BUSPAR) 7.5 MG tablet Take 7.5 mg by mouth 2 (two) times daily.  . clonazePAM (KLONOPIN) 0.5 MG disintegrating tablet Take 1 tablet (0.5 mg total) by mouth 2 (two) times daily as needed (anxiety).  . DULoxetine (CYMBALTA) 60 MG capsule Take 60 mg by mouth daily.  . ferrous sulfate 325 (65 FE) MG tablet Take 1 tablet (325 mg total) by mouth daily with breakfast.  . Fluticasone-Umeclidin-Vilant (TRELEGY ELLIPTA) 100-62.5-25 MCG/INH AEPB Inhale 1 puff into the lungs daily.  . furosemide (LASIX) 20 MG tablet Take 1 tablet (20 mg total) by mouth daily.  . montelukast (SINGULAIR) 10 MG tablet Take 1 tablet (10 mg total) by mouth at bedtime.  Marland Kitchen omeprazole (PRILOSEC) 20 MG capsule Take 20 mg by mouth daily.  . predniSONE (DELTASONE) 10 MG tablet Take 40mg  daily for 3days,Take 30mg  daily for 3days,Take 20mg  daily for 3days,Take 10mg  daily for 3days, then stop  . tiZANidine (ZANAFLEX) 2 MG tablet Take 2-3 mg by mouth every 8 (eight) hours as needed for muscle spasms.   No facility-administered encounter medications on file as of 01/26/2019.      Review of Systems  Review of Systems     Physical Exam  There were no vitals taken for this visit.  Wt Readings from Last 5 Encounters:  11/17/18 100 lb 8.5 oz (  45.6 kg)  11/11/18 98 lb 5.2 oz (44.6 kg)  08/25/18 102 lb (46.3 kg)  06/26/18 94 lb 2.2 oz (42.7 kg)  06/17/18 104 lb (47.2 kg)     Physical Exam   Lab Results:  CBC    Component Value Date/Time   WBC 10.2 11/17/2018 0435   RBC 4.53 11/17/2018 0435   HGB 9.9 (L) 11/17/2018 0435   HCT 37.3 11/17/2018 0435   PLT 318 11/17/2018 0435   MCV 82.3 11/17/2018 0435   MCH 21.9 (L) 11/17/2018 0435   MCHC 26.5 (L) 11/17/2018 0435   RDW 23.9 (H) 11/17/2018 0435   LYMPHSABS 2.0 11/09/2018 1745   MONOABS 0.5 11/09/2018 1745   EOSABS 0.1 11/09/2018 1745   BASOSABS 0.0  11/09/2018 1745    BMET    Component Value Date/Time   NA 141 11/17/2018 0435   K 4.9 11/17/2018 0435   CL 101 11/17/2018 0435   CO2 31 11/17/2018 0435   GLUCOSE 123 (H) 11/17/2018 0435   BUN 18 11/17/2018 0435   CREATININE 0.56 11/17/2018 0435   CALCIUM 8.9 11/17/2018 0435   GFRNONAA >60 11/17/2018 0435   GFRAA >60 11/17/2018 0435    BNP    Component Value Date/Time   BNP 306.3 (H) 11/16/2018 1710    ProBNP    Component Value Date/Time   PROBNP 69.4 05/28/2013 0926    Imaging: No results found.   Assessment & Plan:   No problem-specific Assessment & Plan notes found for this encounter.     Ivonne Andrewonya S Ashelyn Mccravy, NP 01/26/2019

## 2019-04-22 ENCOUNTER — Ambulatory Visit: Payer: Medicaid Other | Admitting: Pulmonary Disease

## 2019-04-28 ENCOUNTER — Ambulatory Visit: Payer: Medicaid Other | Admitting: Pulmonary Disease

## 2019-12-15 DEATH — deceased

## 2020-07-06 IMAGING — DX DG ABD PORTABLE 1V
1 series · 1 of 1 positions shown · non-contrast
Comparison: 06/22/2018 at 0778 hours

CLINICAL DATA: og tube verification; pt woke from sedation while
xray in room; RN had to give more meds due to pt very agitated and
wouldn't stay down for imaging. Best image obtained.

EXAM:
PORTABLE ABDOMEN - 1 VIEW

[abdomen]
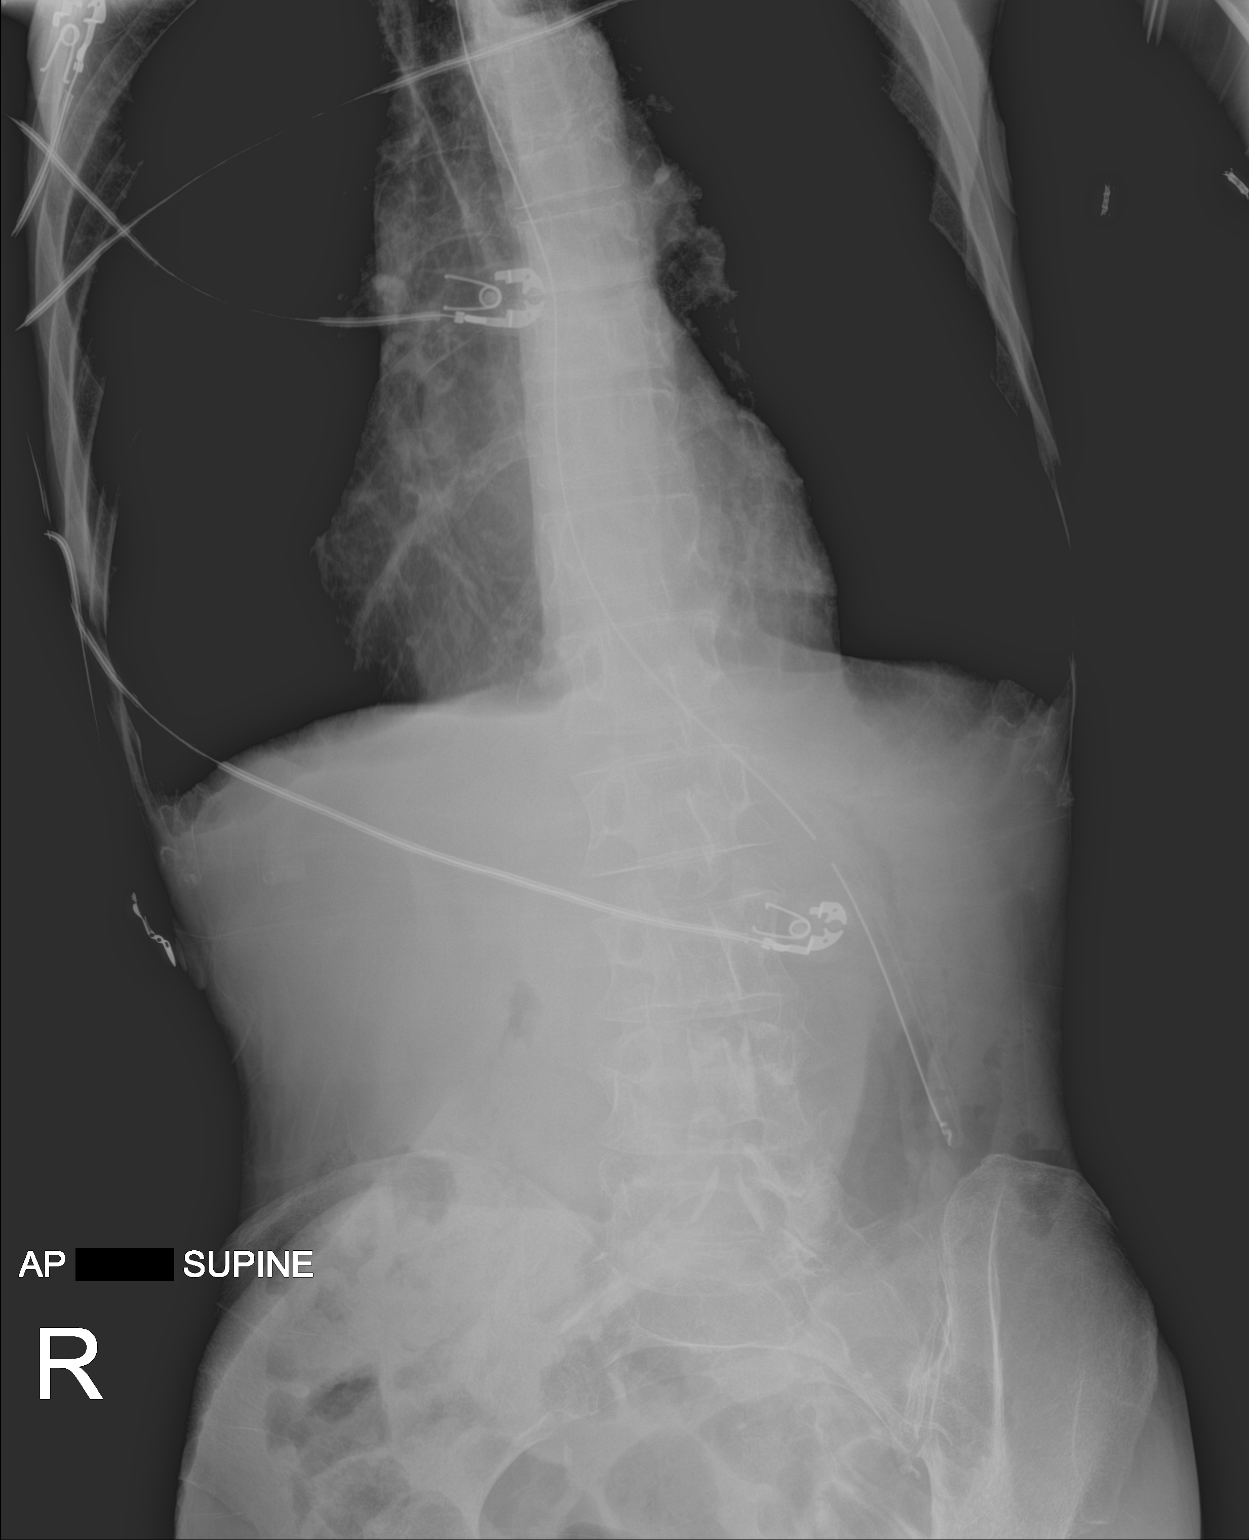

[1 of 1 positions shown; findings below may reference images not displayed]

FINDINGS: Nasal/orogastric tube passes well below the diaphragm. Tip projects
in the mid stomach.
IMPRESSION: Well-positioned nasal/orogastric tube.

## 2020-11-24 IMAGING — DX CHEST - 2 VIEW
2 series · 2 of 2 positions shown · non-contrast
Comparison: 11/09/2018.  06/26/2018

CLINICAL DATA: Chronic bronchitis and asthma.

EXAM:
CHEST - 2 VIEW

[chest lat]
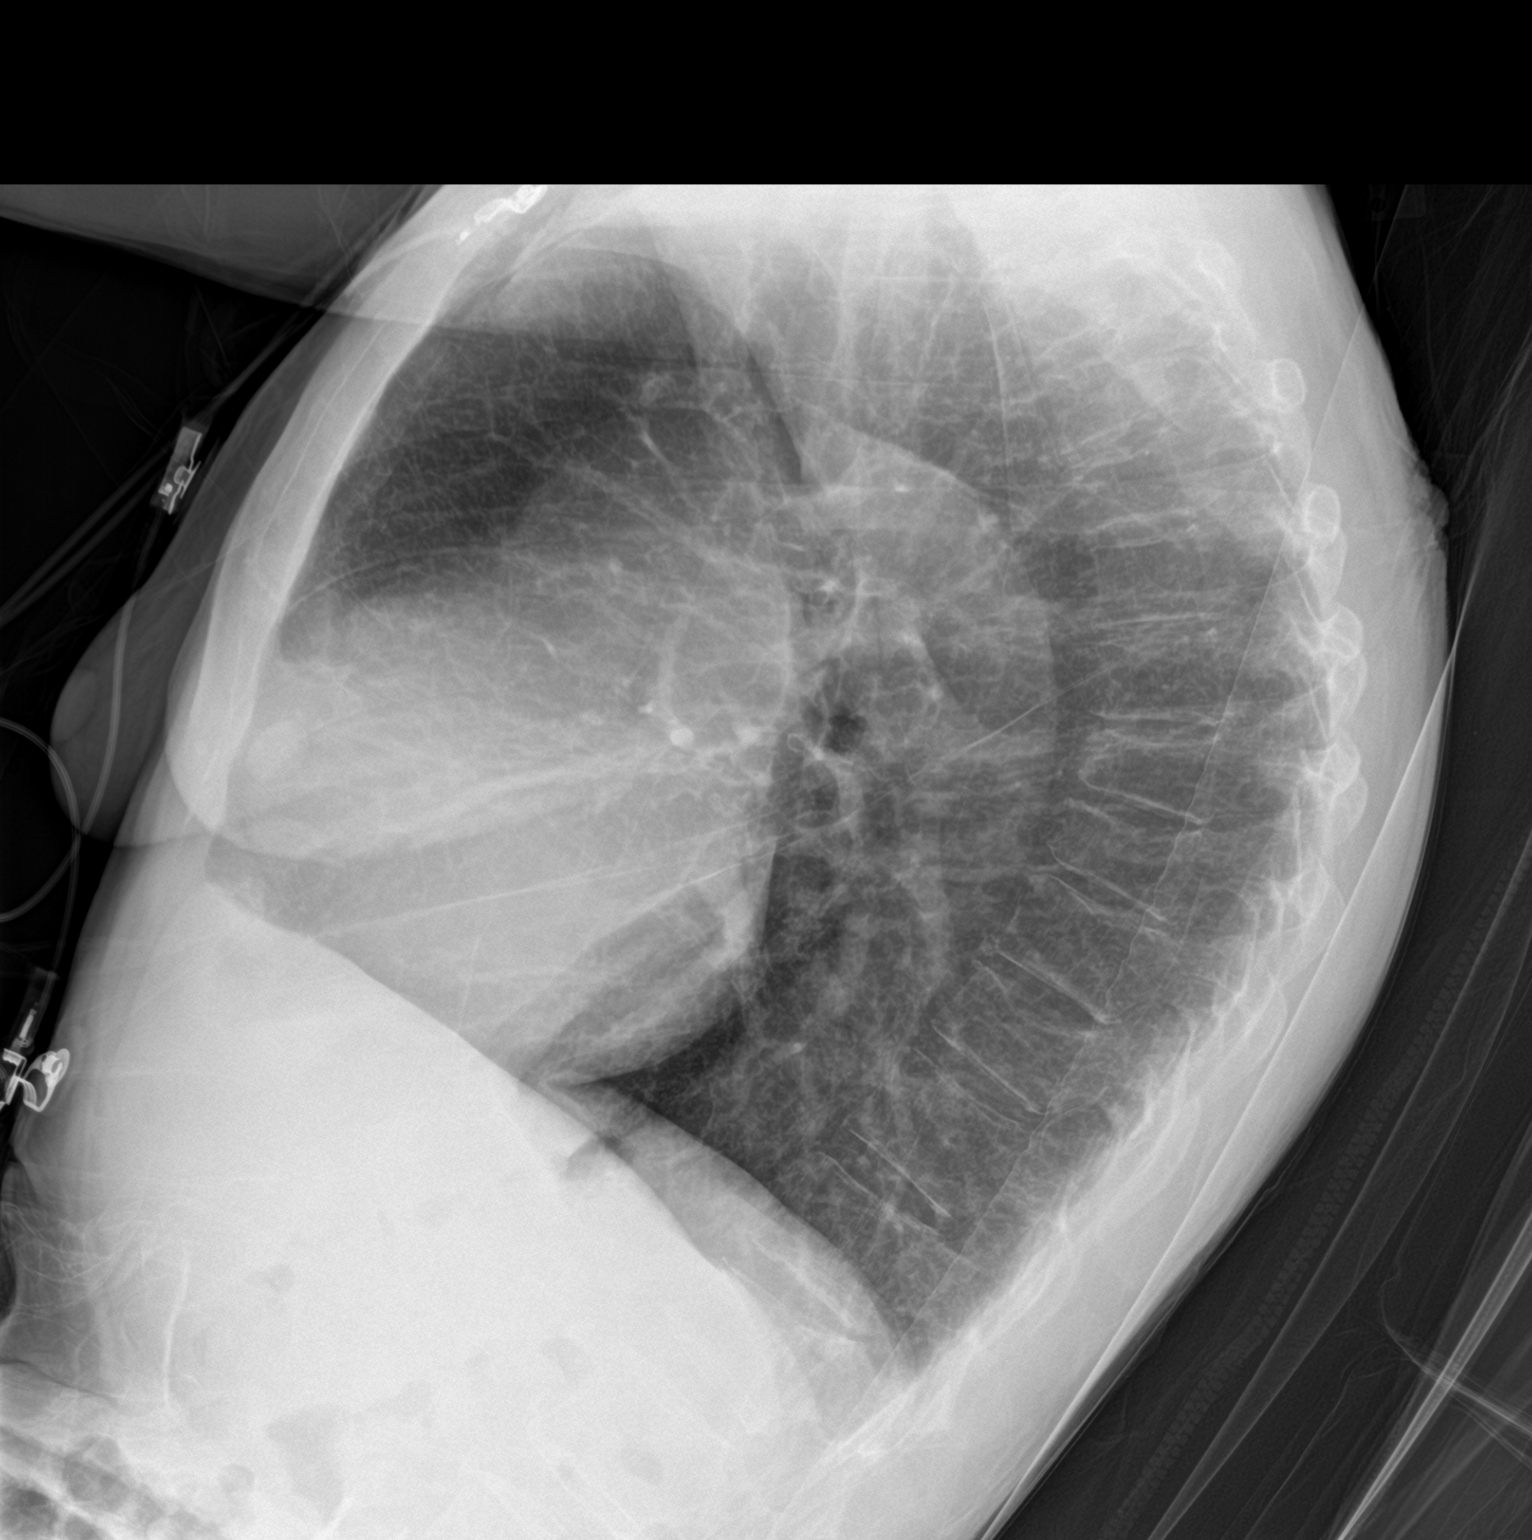

[chest ap]
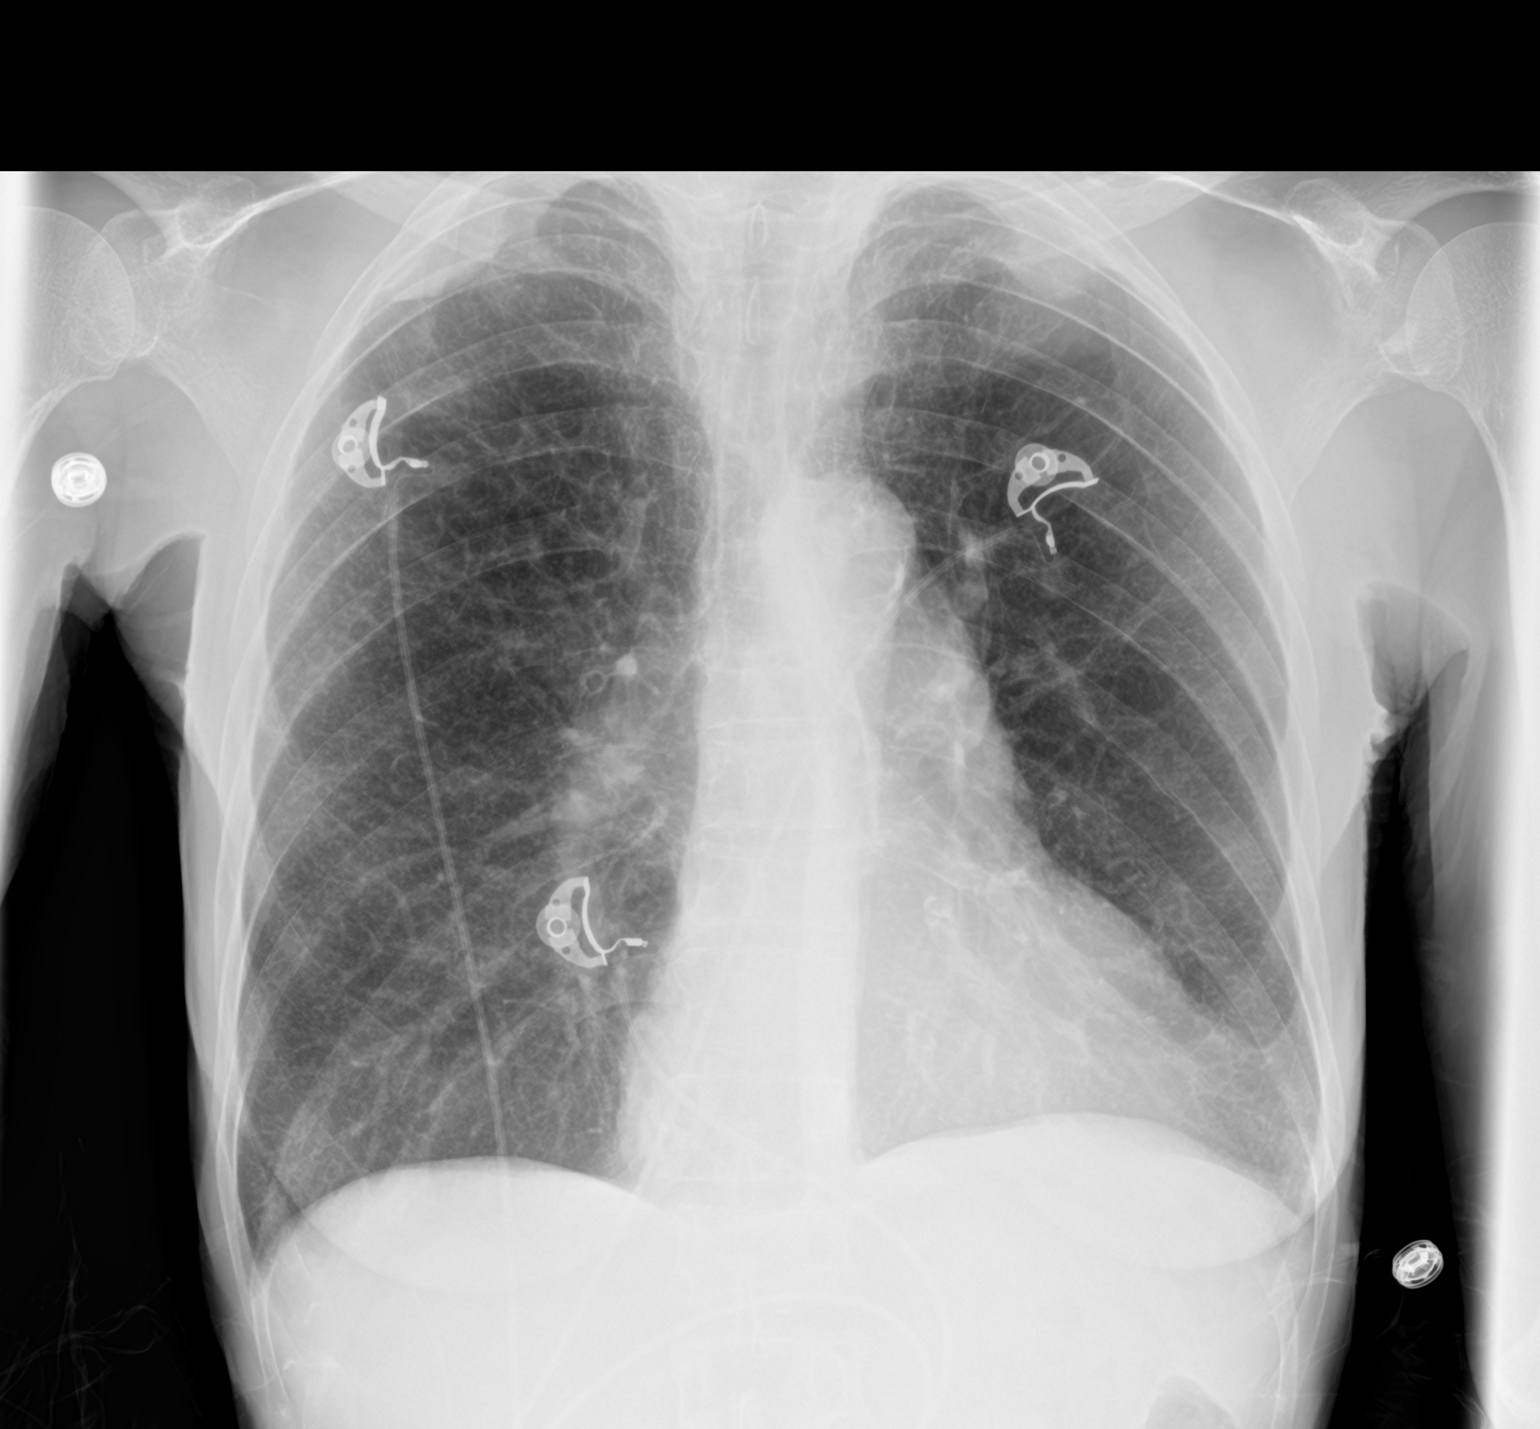

[2 of 2 positions shown; findings below may reference images not displayed]

FINDINGS: Today's examination appears baseline, with mild cardiomegaly and
aortic atherosclerosis. Chronic interstitial lung markings are
present consistent with chronic lung disease/emphysema. The
pronounced enlargement of the heart suggested on yesterday's chest
radiograph is not shown today. No evidence of effusion or collapse.
IMPRESSION: Baseline appearance with mild cardiomegaly and aortic
atherosclerosis and chronic interstitial lung disease/emphysema.
